# Patient Record
Sex: Male | Born: 1955 | Race: White | Hispanic: No | Marital: Married | Smoking: Never smoker
Health system: Southern US, Community
[De-identification: ages and names within clinical notes are randomized; demographics above are authoritative.]

## PROBLEM LIST (undated history)

## (undated) DIAGNOSIS — I10 Essential (primary) hypertension: Secondary | ICD-10-CM

## (undated) DIAGNOSIS — M4310 Spondylolisthesis, site unspecified: Secondary | ICD-10-CM

## (undated) DIAGNOSIS — I2510 Atherosclerotic heart disease of native coronary artery without angina pectoris: Secondary | ICD-10-CM

## (undated) DIAGNOSIS — N400 Enlarged prostate without lower urinary tract symptoms: Secondary | ICD-10-CM

## (undated) DIAGNOSIS — R7303 Prediabetes: Secondary | ICD-10-CM

## (undated) DIAGNOSIS — E785 Hyperlipidemia, unspecified: Secondary | ICD-10-CM

## (undated) DIAGNOSIS — M549 Dorsalgia, unspecified: Secondary | ICD-10-CM

## (undated) DIAGNOSIS — R079 Chest pain, unspecified: Secondary | ICD-10-CM

## (undated) DIAGNOSIS — G4733 Obstructive sleep apnea (adult) (pediatric): Secondary | ICD-10-CM

## (undated) HISTORY — DX: Benign prostatic hyperplasia without lower urinary tract symptoms: N40.0

## (undated) HISTORY — DX: Chest pain, unspecified: R07.9

## (undated) HISTORY — DX: Atherosclerotic heart disease of native coronary artery without angina pectoris: I25.10

## (undated) HISTORY — DX: Spondylolisthesis, site unspecified: M43.10

## (undated) HISTORY — DX: Obstructive sleep apnea (adult) (pediatric): G47.33

## (undated) HISTORY — DX: Dorsalgia, unspecified: M54.9

## (undated) HISTORY — DX: Prediabetes: R73.03

## (undated) HISTORY — DX: Essential (primary) hypertension: I10

## (undated) HISTORY — DX: Hyperlipidemia, unspecified: E78.5

---
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
## 1980-11-05 HISTORY — PX: MOUTH SURGERY: SHX715

## 2011-11-06 DIAGNOSIS — G4733 Obstructive sleep apnea (adult) (pediatric): Secondary | ICD-10-CM

## 2011-11-06 HISTORY — DX: Obstructive sleep apnea (adult) (pediatric): G47.33

## 2014-11-27 ENCOUNTER — Encounter: Payer: Self-pay | Admitting: Cardiology

## 2014-12-23 ENCOUNTER — Ambulatory Visit (INDEPENDENT_AMBULATORY_CARE_PROVIDER_SITE_OTHER): Admitting: Cardiology

## 2014-12-23 ENCOUNTER — Encounter: Payer: Self-pay | Admitting: Cardiology

## 2014-12-23 VITALS — BP 136/84 | HR 56 | Ht 71.0 in | Wt 237.1 lb

## 2014-12-23 DIAGNOSIS — M5431 Sciatica, right side: Secondary | ICD-10-CM | POA: Insufficient documentation

## 2014-12-23 DIAGNOSIS — R0789 Other chest pain: Secondary | ICD-10-CM

## 2014-12-23 DIAGNOSIS — I10 Essential (primary) hypertension: Secondary | ICD-10-CM | POA: Diagnosis not present

## 2014-12-23 DIAGNOSIS — R0609 Other forms of dyspnea: Secondary | ICD-10-CM | POA: Diagnosis not present

## 2014-12-23 DIAGNOSIS — E7800 Pure hypercholesterolemia, unspecified: Secondary | ICD-10-CM | POA: Insufficient documentation

## 2014-12-23 DIAGNOSIS — R079 Chest pain, unspecified: Secondary | ICD-10-CM | POA: Insufficient documentation

## 2014-12-23 NOTE — Progress Notes (Signed)
Todd Kane Date of Birth:  Mar 03, 1956 [facility] 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  He is seen at the request of Dr. Kateri Plummer.  He is being seen because of a recent episode of chest discomfort which occurred during exertion.  Patient was shoveling snow during the last storm.  After shoveling for about 45 minutes he began to notice a discomfort in the right central portion of his chest.  It did not radiate.  The pain persisted and he called EMS who came out and checked on him and didn't EKG.  By that time the pain was resolving after he had taken some aspirin at home and the EKG done by EMS showed normal sinus rhythm and no ischemic changes.  They offered to carry him to the emergency room but he declined.  He comes in today for further evaluation.  The patient is new to this area.  He had the lab work for his complete physical with Dr. Kateri Plummer yesterday and the results of that are pending.  He has a history of hypercholesterolemia and is on atorvastatin.  He has had high blood pressure for approximately 10 years and is on Hyzaar.  He takes a baby aspirin daily.  He states that several years ago he had a stress test while living in [place] which was normal.  The patient also has a history of obstructive sleep apnea and BPH.  He also has spondylolisthesis and sciatica with radiation of pain down the right leg.  The patient has a history of prediabetes and is modestly overweight. His family history reveals a strong family history of heart disease on his mother's side.  His mother had angina pectoris but died of breast cancer at age 92.  His maternal grandmother had a heart attack.  2 maternal uncles have had heart attacks at relatively young age.  Patient's father died of COPD and was a heavy smoker. The patient is a  retired Education officer, community.  He just retired from serving his career in the [employer].  He graduated from [employer].  Current Outpatient Prescriptions  Medication Sig Dispense Refill  . alfuzosin (UROXATRAL) 10 MG 24 hr tablet Take 10 mg by mouth daily with breakfast.    . aspirin 81 MG tablet Take 81 mg by mouth daily.    Marland Kitchen atorvastatin (LIPITOR) 40 MG tablet Take 40 mg by mouth daily.    . cholecalciferol (VITAMIN D) 1000 UNITS tablet Take 1,000 Units by mouth daily.    . finasteride (PROSCAR) 5 MG tablet Take 5 mg by mouth daily.  0  . losartan-hydrochlorothiazide (HYZAAR) 100-25 MG per tablet Take 1 tablet by mouth daily.     No current facility-administered medications for this visit.    Allergies  Allergen Reactions  . Oxytetracycline     Not known  . Sulfa Antibiotics     Not known    Patient Active Problem List   Diagnosis Date Noted  . Chest pain of uncertain etiology 12/23/2014  . Dyspnea on exertion 12/23/2014  . Sciatica of right side 12/23/2014  . Essential hypertension 12/23/2014  . Hypercholesterolemia 12/23/2014    History  Smoking status  . Not on file  Smokeless tobacco  . Not on file    History  Alcohol  Use: Not on file    Family History  Problem Relation Age of Onset  . Heart disease Mother   . COPD Mother   . Cancer Mother   . COPD Father     Review of Systems: Constitutional: no fever chills diaphoresis or fatigue or change in weight.  Head and neck: no hearing loss, no epistaxis, no photophobia or visual disturbance. Respiratory: Positive for exertional dyspnea Cardiovascular: No chest pain peripheral edema, palpitations. Gastrointestinal: No abdominal distention, no abdominal pain, no change in bowel habits hematochezia or melena. Genitourinary: No dysuria, no frequency, no urgency, no nocturia. Musculoskeletal:No arthralgias, no back pain, no gait disturbance or myalgias. Neurological: No dizziness, no headaches, no numbness, no  seizures, no syncope, no weakness, no tremors. Hematologic: No lymphadenopathy, no easy bruising. Psychiatric: No confusion, no hallucinations, no sleep disturbance.   Wt Readings from Last 3 Encounters:  12/23/14 237 lb 1.9 oz (107.557 kg)    Physical Exam: Filed Vitals:   12/23/14 1544  BP: 136/84  Pulse: 56  The patient appears to be in no distress.  Mildly obese  Head and neck exam reveals that the pupils are equal and reactive.  The extraocular movements are full.  There is no scleral icterus.  Mouth and pharynx are benign.  No lymphadenopathy.  No carotid bruits.  The jugular venous pressure is normal.  Thyroid is not enlarged or tender.  Chest is clear to percussion and auscultation.  No rales or rhonchi.  Expansion of the chest is symmetrical.  Heart reveals no abnormal lift or heave.  First and second heart sounds are normal.  There is no murmur gallop rub or click.  The abdomen is soft and nontender.  Bowel sounds are normoactive.  There is no hepatosplenomegaly or mass.  There are no abdominal bruits.  Extremities reveal no phlebitis or edema.  Pedal pulses are good.  There is no cyanosis or clubbing.  Neurologic exam is normal strength and no lateralizing weakness.  No sensory deficits.  Integument reveals no rash  EKG shows sinus bradycardia and is otherwise within normal limits.  Assessment / Plan: 1.  Exertional chest discomfort with multiple risk factors for coronary disease. 2.  Essential hypertension 3.  Dyspnea on exertion 4.  Obstructive sleep apnea 5.  Hypercholesterolemia 6.  Borderline diabetes by history 7.  Sciatica with right leg radiation  Plan: The patient will continue current medication including daily aspirin.  We will have him return soon for a walking Lexiscan Myoview stress test to evaluate his chest pain and dyspnea further. Many thanks for the opportunity to see this pleasant gentleman.  I will be in touch regarding the results of his  stress test.

## 2014-12-23 NOTE — Patient Instructions (Addendum)
Your physician recommends that you continue on your current medications as directed. Please refer to the Current Medication list given to you today.  Your physician has requested that you have a lexiscan myoview. For further information please visit [email or web address]. Please follow instruction sheet, as given. Richelle ItoWALKING LEXISCAN  Your physician wants you to follow-up in: 6 month ov You will receive a reminder letter in the mail two months in advance. If you don't receive a letter, please call our office to schedule the follow-up appointment.

## 2015-01-11 ENCOUNTER — Ambulatory Visit (HOSPITAL_COMMUNITY): Attending: Cardiology | Admitting: Radiology

## 2015-01-11 DIAGNOSIS — R002 Palpitations: Secondary | ICD-10-CM | POA: Insufficient documentation

## 2015-01-11 DIAGNOSIS — R079 Chest pain, unspecified: Secondary | ICD-10-CM | POA: Insufficient documentation

## 2015-01-11 DIAGNOSIS — I10 Essential (primary) hypertension: Secondary | ICD-10-CM | POA: Diagnosis not present

## 2015-01-11 DIAGNOSIS — R0789 Other chest pain: Secondary | ICD-10-CM

## 2015-01-11 DIAGNOSIS — R0609 Other forms of dyspnea: Secondary | ICD-10-CM | POA: Insufficient documentation

## 2015-01-11 MED ORDER — TECHNETIUM TC 99M SESTAMIBI GENERIC - CARDIOLITE
11.0000 | Freq: Once | INTRAVENOUS | Status: AC | PRN
  Administered 2015-01-11: 11 via INTRAVENOUS

## 2015-01-11 MED ORDER — TECHNETIUM TC 99M SESTAMIBI GENERIC - CARDIOLITE
33.0000 | Freq: Once | INTRAVENOUS | Status: AC | PRN
  Administered 2015-01-11: 33 via INTRAVENOUS

## 2015-01-11 NOTE — Progress Notes (Signed)
[facility] SITE 3 NUCLEAR MED [address] [place], [place] [address] [phone]    Cardiology Nuclear Med Study  Todd Kane is a 59 y.o. male     MRN : [id number]     DOB: 11/08/1955  Procedure Date: 01/11/2015  Nuclear Med Background Indication for Stress Test:  Evaluation for Ischemia History:  No known CAD Cardiac Risk Factors: Hypertension and Lipids  Symptoms:  Chest Pain, DOE and Palpitations   Nuclear Pre-Procedure Caffeine/Decaff Intake:  None> 12 hrs NPO After: 11:00pm   Lungs:  clear O2 Sat: 97% on room air. IV 0.9% NS with Angio Cath:  22g  IV Site: R Hand x 1, tolerated well IV Started by:  Irean HongPatsy Edwards, RN  Chest Size (in):  46 Cup Size: n/a  Height: 5\' 11"  (1.803 m)  Weight:  230 lb (104.327 kg)  BMI:  Body mass index is 32.09 kg/(m^2). Tech Comments:  Patient took Hyzaar last night. Irean HongPatsy Edwards, RN.    Nuclear Med Study 1 or 2 day study: 1 day  Stress Test Type:  Stress  Reading MD: N/A  Order Authorizing Provider:  Cassell Clementhomas Brackbill, MD  Resting Radionuclide: Technetium 7333m Sestamibi  Resting Radionuclide Dose: 11.0 mCi   Stress Radionuclide:  Technetium 3233m Sestamibi  Stress Radionuclide Dose: 33.0 mCi           Stress Protocol Rest HR: 50 Stress HR: 142  Rest BP: 124/81 Stress BP: 157/76  Exercise Time (min): 8:00 METS: 10.1   Predicted Max HR: 159 bpm % Max HR: 89.31 bpm Rate Pressure Product: [id number]   Dose of Adenosine (mg):  n/a Dose of Lexiscan: n/a mg  Dose of Atropine (mg): n/a Dose of Dobutamine: n/a mcg/kg/min (at max HR)  Stress Test Technologist: Nelson ChimesSharon Brooks, BS-ES  Nuclear Technologist:  Kerby NoraElzbieta Kubak, CNMT     Rest Procedure:  Myocardial perfusion imaging was performed at rest 45 minutes following the intravenous administration of Technetium 6733m Sestamibi. Rest ECG: sinus bradycardia at 50bpm with nonspecific T wave abnormality  Stress Procedure:  The patient exercised on the treadmill utilizing the Bruce  Protocol for 8:00 minutes. The patient stopped due to fatigue and denied any chest pain.  Technetium 5333m Sestamibi was injected at peak exercise and myocardial perfusion imaging was performed after a brief delay. Stress ECG: No significant change from baseline ECG  QPS Raw Data Images:  Mild diaphragmatic attenuation.  Normal left ventricular size. Stress Images:  There is decreased uptake in the basal and mid inferolateral wall. Rest Images:  There is decreased uptake in the  inferolateral wall. Subtraction (SDS):  No evidence of ischemia. Transient Ischemic Dilatation (Normal <1.22):  0.94 Lung/Heart Ratio (Normal <0.45):  0.38  Quantitative Gated Spect Images QGS EDV:  101 ml QGS ESV:  42 ml  Impression Exercise Capacity:  Good exercise capacity. BP Response:  Normal blood pressure response. Clinical Symptoms:  No significant symptoms noted. ECG Impression:  No significant ST segment change suggestive of ischemia. Comparison with Prior Nuclear Study: No images to compare  Overall Impression:  Low risk stress nuclear study with a small in size, mild in intesity, fixed defect in the basal and mid inferolateral walls.  There is no evidence of inducible ischemia.  This is most likely secondary to diaphragmatic attenuation..  LV Ejection Fraction: 58%.  LV Wall Motion:  NL LV Function; NL Wall Motion  Signed: Armanda Magicraci Turner, MD [facility] 01/11/2015

## 2015-08-24 ENCOUNTER — Ambulatory Visit: Attending: Family Medicine

## 2015-08-24 DIAGNOSIS — M545 Low back pain, unspecified: Secondary | ICD-10-CM

## 2015-08-24 DIAGNOSIS — M25659 Stiffness of unspecified hip, not elsewhere classified: Secondary | ICD-10-CM | POA: Insufficient documentation

## 2015-08-24 NOTE — Therapy (Signed)
[facility] [address], [address] [place], [place], [address] Phone: [phone]   Fax:  [phone]  Physical Therapy Evaluation  Patient Details  Name: Todd Kane MRN: [id number] Date of Birth: [birth year] Referring Provider: Farris Has, MD  Encounter Date: 08/24/2015      PT End of Session - 08/24/15 1514    Visit Number 1   Date for PT Re-Evaluation 10/19/15   PT Start Time 1436   PT Stop Time 1521   PT Time Calculation (min) 45 min   Activity Tolerance Patient tolerated treatment well   Behavior During Therapy [facility] for tasks assessed/performed      Past Medical History  Diagnosis Date  . Hyperlipidemia   . Hypertension   . Chest pain   . OSA (obstructive sleep apnea)   . BPH (benign prostatic hyperplasia)   . Back pain   . Spondylolisthesis     Past Surgical History  Procedure Laterality Date  . Tonsillectomy and adenoidectomy    . Mouth surgery  1982    3rd molers     There were no vitals filed for this visit.  Visit Diagnosis:  Bilateral low back pain without sciatica - Plan: PT plan of care cert/re-cert  Hip stiffness, unspecified laterality - Plan: PT plan of care cert/re-cert      Subjective Assessment - 08/24/15 1435    Subjective Pt reports to PT with complaints of >10 year history of LBP. Pt has radiculopathy into the Rt LE.  Pt had MRI ~5 years ago and showed spondylolisthsis and stenosis per pt report.     Limitations Standing;Walking   How long can you walk comfortably? Rt leg feels weak after walking for exercise   Diagnostic tests MRI: spondylisthesis and stenosis   Patient Stated Goals reduce pain, stand longer   Currently in Pain? Yes   Pain Score 6   4-6/10   Pain Location Back   Pain Orientation Right;Left;Lower   Pain Type Chronic pain   Pain Radiating Towards Rt gluteals, down leg into the toes   Pain Onset More than a month ago   Pain Frequency Constant    Aggravating Factors  walking, standing   Pain Relieving Factors stretching, sitting            [facility] PT Assessment - 08/24/15 0001    Assessment   Medical Diagnosis back pIn (M54.9)   Referring Provider Farris Has, MD   Onset Date/Surgical Date 08/23/05   Next MD Visit 11/2015   Prior Therapy 1.5 years ago in [place]   Precautions   Precautions None   Restrictions   Weight Bearing Restrictions No   Balance Screen   Has the patient fallen in the past 6 months No   Has the patient had a decrease in activity level because of a fear of falling?  No   Is the patient reluctant to leave their home because of a fear of falling?  No   Home Environment   Living Environment Private residence   Type of Home House   Home Access Stairs to enter   Entrance Stairs-Number of Steps 3   Home Layout Two level   Prior Function   Level of Independence Independent   Vocation Retired  [employer]   Leisure walk for exercise   Cognition   Overall Cognitive Status Within Functional Limits for tasks assessed   Observation/Other Assessments   Focus on Therapeutic Outcomes (FOTO)  43% limitation   Posture/Postural Control  Posture/Postural Control Postural limitations   Postural Limitations Rounded Shoulders;Decreased lumbar lordosis   ROM / Strength   AROM / PROM / Strength AROM;PROM;Strength   AROM   Overall AROM  Within functional limits for tasks performed   Overall AROM Comments Full lumbar AROM with pain reported with lumbar extension   PROM   Overall PROM  Deficits   Overall PROM Comments Hamstring flexibility limited by 50% bilaterally, IR limited by 25%   Strength   Overall Strength Within functional limits for tasks performed   Overall Strength Comments 4+/5 bilateral LE strength   Palpation   Spinal mobility reduce mobility T10-L5 with pain at L3-5   Palpation comment tension and mild tenderness over bilateral lumbar parapsinals and deep gluteals                            PT Education - 08/24/15 1513    Education provided Yes   Education Details HEP: lumbar flexibility   Person(s) Educated Patient   Methods Explanation;Demonstration;Handout   Comprehension Verbalized understanding;Returned demonstration          PT Short Term Goals - 08/24/15 1525    PT SHORT TERM GOAL #1   Title be independent in initial HEP   Time 4   Period Weeks   Status New   PT SHORT TERM GOAL #2   Title report a 25% reduction in Rt LE pain with standing and walking   Time 4   Period Weeks   Status New   PT SHORT TERM GOAL #3   Title demonstrate correct body mechanics with getting out of bed and with lifitng for lumbar protection   Time 4   Period Weeks   Status New           PT Long Term Goals - 08/24/15 1434    PT LONG TERM GOAL #1   Title be independent in advanced HEP   Time 8   Period Weeks   Status New   PT LONG TERM GOAL #2   Title reduce FOTO to < or = to 36% limitation   Time 8   Period Weeks   Status New   PT LONG TERM GOAL #3   Title report a 50% reduction in Rt LE pain with standing and walking   Time 8   Period Weeks   Status New   PT LONG TERM GOAL #4   Title walk for exercise without Rt LE feeling weak or giving out    Time 8   Period Weeks   Status New               Plan - 08/24/15 1521    Clinical Impression Statement Pt presents to PT with complatins of LBP and Rt LE lasting > 10 years.  Symptoms have increased in the Rt LE over the past year or so.  Pt demonstrates significant hamstring tighteness and pain with lumbar extension.  Pt with weak abdominals and FOTO score of 43% limitation.  Pt will benefit from skilled PT for pain management, core strength progression, hip flexibility and traction if needed.     Pt will benefit from skilled therapeutic intervention in order to improve on the following deficits Postural dysfunction;Impaired flexibility;Improper body  mechanics;Pain;Decreased activity tolerance;Decreased range of motion   Rehab Potential Good   PT Frequency 2x / week   PT Duration 8 weeks   PT Treatment/Interventions ADLs/Self Care Home Management;Cryotherapy;Electrical Stimulation;Moist Heat;Therapeutic exercise;Therapeutic  activities;Functional mobility training;Ultrasound;Traction;Neuromuscular re-education;Patient/family education;Manual techniques;Passive range of motion   PT Next Visit Plan Begin core strength, hip flexor stretch, lumbar traction   Consulted and Agree with Plan of Care Patient          G-Codes - 08/24/15 1433    Functional Assessment Tool Used --   Functional Limitation --   Other PT Primary Current Status (Z6109(G8990) --   Other PT Primary Goal Status (U0454(G8991) --       Problem List Patient Active Problem List   Diagnosis Date Noted  . Chest pain of uncertain etiology 12/23/2014  . Dyspnea on exertion 12/23/2014  . Sciatica of right side 12/23/2014  . Essential hypertension 12/23/2014  . Hypercholesterolemia 12/23/2014    Estil Vallee, PT 08/24/2015, 3:28 PM  [facility] [address], [address] [place], [place], [address] Phone: [phone]   Fax:  [phone]  Name: Todd Kane MRN: [id number] Date of Birth: 09/19/1956

## 2015-08-24 NOTE — Patient Instructions (Signed)
Perform all exercises below:  Hold _20___ seconds. Repeat _3___ times.  Do __3__ sessions per day. CAUTION: Movement should be gentle, steady and slow.  Knee to Chest  Lying supine, bend involved knee to chest. Perform with each leg.  Copyright  VHI. All rights reserved.  Double Knee to Chest (Flexion)   Gently pull both knees toward chest. Feel stretch in lower back or buttock area. Breathing deeply, Lumbar Rotation: Caudal - Bilateral (Supine)  Feet and knees together, arms outstretched, rotate knees left, turning head in opposite direction, until stretch is felt.      HIP: Hamstrings - Short Sitting   Rest leg on raised surface. Keep knee straight. Lift chest.   [facility] [address], [address] [place], [place] [address] Phone # [phone] Fax [phone] 

## 2015-09-05 ENCOUNTER — Encounter: Payer: Self-pay | Admitting: Physical Therapy

## 2015-09-05 ENCOUNTER — Ambulatory Visit: Admitting: Physical Therapy

## 2015-09-05 DIAGNOSIS — M25659 Stiffness of unspecified hip, not elsewhere classified: Secondary | ICD-10-CM

## 2015-09-05 DIAGNOSIS — M545 Low back pain, unspecified: Secondary | ICD-10-CM

## 2015-09-05 NOTE — Therapy (Signed)
[facility] [address], [address] [place], [place], [address] Phone: [phone]   Fax:  [phone]  Physical Therapy Treatment  Patient Details  Name: Todd Kane MRN: [id number] Date of Birth: 03/30/1956 Referring Provider: Farris HasMorrow, Aaron, MD  Encounter Date: 09/05/2015      PT End of Session - 09/05/15 1640    Visit Number 2   Date for PT Re-Evaluation 10/19/15   PT Start Time 1615   PT Stop Time 1700   PT Time Calculation (min) 45 min   Activity Tolerance Patient tolerated treatment well   Behavior During Therapy [facility] for tasks assessed/performed      Past Medical History  Diagnosis Date  . Hyperlipidemia   . Hypertension   . Chest pain   . OSA (obstructive sleep apnea)   . BPH (benign prostatic hyperplasia)   . Back pain   . Spondylolisthesis     Past Surgical History  Procedure Laterality Date  . Tonsillectomy and adenoidectomy    . Mouth surgery  1982    3rd molers     There were no vitals filed for this visit.  Visit Diagnosis:  Bilateral low back pain without sciatica  Hip stiffness, unspecified laterality      Subjective Assessment - 09/05/15 1622    Subjective Pt reports compliance with initial HEP and it seem to help. No complains of pain at this time, but early this morning rated as a 4/10 .   Pertinent History LBP > 10 year, with radiculopath into Rt LE. MRI 5 years ago showed spondylosisthesis and stenosis per pt report   Limitations Standing;Walking   Currently in Pain? No/denies                         [facility] PT Treatment/Exercise - 09/05/15 0001    Posture/Postural Control   Posture/Postural Control Postural limitations   Postural Limitations Rounded Shoulders;Decreased lumbar lordosis   Exercises   Exercises Lumbar;Knee/Hip   Lumbar Exercises: Stretches   Active Hamstring Stretch 3 reps;20 seconds  each leg   Single Knee to Chest Stretch 3 reps;20  seconds  each leg with oposite leg extented   Double Knee to Chest Stretch 3 reps;20 seconds  using towel   Lower Trunk Rotation 3 reps;20 seconds  each side    Prone Mid Back Stretch --  Cobra/ forearm postion for hip stretch   Lumbar Exercises: Aerobic   Stationary Bike L 1 x 10min   Lumbar Exercises: Supine   Bridge 10 reps;5 seconds   Lumbar Exercises: Quadruped   Plank 3 x 10 sec hold                PT Education - 09/05/15 1710    Education provided Yes   Education Details iliopsoas stretch in thomasgrip, and at edge of table, low lunge,  Bridge   Person(s) Educated Patient   Methods Explanation;Demonstration;Handout   Comprehension Verbalized understanding;Returned demonstration          PT Short Term Goals - 09/05/15 1645    PT SHORT TERM GOAL #1   Title be independent in initial HEP   Time 4   Period Weeks   Status On-going   PT SHORT TERM GOAL #2   Title report a 25% reduction in Rt LE pain with standing and walking   Time 4   Period Weeks   Status On-going   PT SHORT TERM GOAL #3   Title  demonstrate correct body mechanics with getting out of bed and with lifitng for lumbar protection   Time 4   Period Weeks   Status On-going           PT Long Term Goals - 09/05/15 1645    PT LONG TERM GOAL #1   Title be independent in advanced HEP   Time 8   Period Weeks   Status On-going   PT LONG TERM GOAL #2   Title reduce FOTO to < or = to 36% limitation   Time 8   Period Weeks   Status On-going   PT LONG TERM GOAL #3   Title report a 50% reduction in Rt LE pain with standing and walking   Time 8   Period Weeks   Status On-going   PT LONG TERM GOAL #4   Title walk for exercise without Rt LE feeling weak or giving out    Time 8   Period Weeks   Status On-going               Plan - 09/05/15 1640    Clinical Impression Statement Pt with good demo of initial HEP. Pt with weak abdominals and tight hamstreings and hip flexors.Pt will  continue to benefit from skilled PT for pain managment, core strength progression, hip flexibility   Pt will benefit from skilled therapeutic intervention in order to improve on the following deficits Postural dysfunction;Impaired flexibility;Improper body mechanics;Pain;Decreased activity tolerance;Decreased range of motion   Rehab Potential Good   PT Frequency 2x / week   PT Duration 8 weeks   PT Treatment/Interventions ADLs/Self Care Home Management;Cryotherapy;Electrical Stimulation;Moist Heat;Therapeutic exercise;Therapeutic activities;Functional mobility training;Ultrasound;Traction;Neuromuscular re-education;Patient/family education;Manual techniques;Passive range of motion   PT Next Visit Plan Add Hamstring in supine, review iliopsoas stretch, low lunge and bridging, work on core strenght in plank .   Consulted and Agree with Plan of Care Patient        Problem List Patient Active Problem List   Diagnosis Date Noted  . Chest pain of uncertain etiology 12/23/2014  . Dyspnea on exertion 12/23/2014  . Sciatica of right side 12/23/2014  . Essential hypertension 12/23/2014  . Hypercholesterolemia 12/23/2014    NAUMANN-HOUEGNIFIO,Todd Kane  PTA  09/05/2015, 5:24 PM  [facility] [address] [place], [place], [address] Phone: [phone]   Fax:  [phone]  Name: Todd Kane MRN: [id number] Date of Birth: 10-Sep-1956

## 2015-09-05 NOTE — Patient Instructions (Addendum)
Flexors, Supine Bridge    Strength       Remember to discontinue exercise if you experience pain in leg or back    Lie supine, feet shoulder-width apart. Lift hips toward ceiling. Hold __5_ seconds. Repeat _10_ times per session. Do _2-3 __ sessions per day.  Hip Flexor Stretch  flexibility   Lying on back near edge of bed, bend one leg, foot flat. Hang other leg over edge, relaxed, thigh resting entirely on bed for  20 seconds. Repeat _x 3 times. Do 2-3 sessions per day. Advanced Exercise: Bend knee back keeping thigh in contact with bed.  http://[email or web address].exer.us/346   HIP: Flexors - Supine   flexibility   Lie on edge of surface. Place leg off the surface, allow knee to bend. Bring other knee toward chest. Hold 20 seconds. _3__ reps per set, _2-3__ sets per day,  .  BACK: Hip Flexor Stretch   Interlace fingers on top of right knee. Shift weight forward. Continue breathing normally and hold position for  20  breaths. Repeat on other leg. Alternate sides  3  times. Do  2-3 times per day.  Quads / HF, Standing   Stand, holding onto chair and grasping one foot with other-side hand. Pull heel toward buttock until stretch is felt in front of thigh. Hold _20__ seconds.  Repeat _3__ times per session. Do 2-3___ sessions per day.  Copyright  VHI. All rights reserved.

## 2015-09-21 ENCOUNTER — Ambulatory Visit: Attending: Family Medicine

## 2015-09-21 DIAGNOSIS — M25659 Stiffness of unspecified hip, not elsewhere classified: Secondary | ICD-10-CM | POA: Insufficient documentation

## 2015-09-21 DIAGNOSIS — M545 Low back pain, unspecified: Secondary | ICD-10-CM

## 2015-09-21 NOTE — Patient Instructions (Signed)
   Lifting Principles  .Maintain proper posture and head alignment. .Slide object as close as possible before lifting. .Move obstacles out of the way. .Test before lifting; ask for help if too heavy. .Tighten stomach muscles without holding breath. .Use smooth movements; do not jerk. .Use legs to do the work, and pivot with feet. .Distribute the work load symmetrically and close to the center of trunk. .Push instead of pull whenever possible.   Squat down and hold basket close to stand. Use leg muscles to do the work.    Avoid twisting or bending back. Pivot around using foot movements, and bend at knees if needed when reaching for articles.        Getting Into / Out of Bed   Lower self to lie down on one side by raising legs and lowering head at the same time. Use arms to assist moving without twisting. Bend both knees to roll onto back if desired. To sit up, start from lying on side, and use same move-ments in reverse. Keep trunk aligned with legs.    Shift weight from front foot to back foot as item is lifted off shelf.    When leaning forward to pick object up from floor, extend one leg out behind. Keep back straight. Hold onto a sturdy support with other hand.      Sit upright, head facing forward. Try using a roll to support lower back. Keep shoulders relaxed, and avoid rounded back. Keep hips level with knees. Avoid crossing legs for long periods.    Supine: Leg Stretch With Strap (Basic)    Lie on back with one knee bent, foot flat on floor. Hook strap around other foot. Straighten knee. Keep knee level with other knee. Hold _20__ seconds. Relax leg completely down to floor.  Repeat _3__ times per session. Do _3__ sessions per day.  Copyright  VHI. All rights reserved.  [facility] [address], [address] [place], [place] [address] Phone # [phone] Fax [phone]

## 2015-09-21 NOTE — Therapy (Signed)
[facility] [address], [address] [place], [place], [address] Phone: [phone]   Fax:  [phone]  Physical Therapy Treatment  Patient Details  Name: Tonye Royaltyhomas Strohman MRN: [id number] Date of Birth: 02/26/1956 Referring Provider: Farris HasMorrow, Aaron, MD  Encounter Date: 09/21/2015      PT End of Session - 09/21/15 1656    Visit Number 3   Date for PT Re-Evaluation 10/19/15   PT Start Time 1614   PT Stop Time 1656   PT Time Calculation (min) 42 min   Activity Tolerance Patient tolerated treatment well   Behavior [facility] for tasks assessed/performed      Past Medical History  Diagnosis Date  . Hyperlipidemia   . Hypertension   . Chest pain   . OSA (obstructive sleep apnea)   . BPH (benign prostatic hyperplasia)   . Back pain   . Spondylolisthesis     Past Surgical History  Procedure Laterality Date  . Tonsillectomy and adenoidectomy    . Mouth surgery  1982    3rd molers     There were no vitals filed for this visit.  Visit Diagnosis:  Bilateral low back pain without sciatica  Hip stiffness, unspecified laterality      Subjective Assessment - 09/21/15 1619    Subjective Pt reports intermittent compliance with HEP.  would like to transition [facility] gym program [facility]   Currently in Pain? Yes   Pain Score 2    Pain Location Back   Pain Orientation Right;Left;Lower   Pain Type Chronic pain   Pain Radiating Towards Rt gluteals   Pain Onset More than a month ago   Pain Frequency Constant   Aggravating Factors  walking, standing   Pain Relieving Factors stretching, sitting                         [facility] Adult PT Treatment/Exercise - 09/21/15 0001    Lumbar Exercises: Stretches   Active Hamstring Stretch 3 reps;20 seconds  each leg   Single Knee to Chest Stretch 3 reps;20 seconds  each leg with oposite leg extented   Double Knee to Chest Stretch 3 reps;20 seconds   using towel   Lower Trunk Rotation 3 reps;20 seconds  each side    Lumbar Exercises: Aerobic   Stationary Bike L 2 x 10min   Lumbar Exercises: Supine   Bridge 10 reps;5 seconds                PT Education - 09/21/15 1640    Education provided Yes   Education Details Estate manager/land agentbody mechanics education, hamstring flexibility   Person(s) Educated Patient   Methods Explanation;Demonstration;Handout   Comprehension Verbalized understanding;Returned demonstration          PT Short Term Goals - 09/21/15 1621    PT SHORT TERM GOAL #1   Title be independent in initial HEP   Time 4   Period Weeks   Status On-going   PT SHORT TERM GOAL #2   Title report a 25% reduction in Rt LE pain with standing and walking   Time 4   Period Weeks   Status On-going   PT SHORT TERM GOAL #3   Title demonstrate correct body mechanics with getting out of bed and with lifitng for lumbar protection   Status Achieved           PT Long Term Goals - 09/05/15 1645    PT  LONG TERM GOAL #1   Title be independent in advanced HEP   Time 8   Period Weeks   Status On-going   PT LONG TERM GOAL #2   Title reduce FOTO to < or = to 36% limitation   Time 8   Period Weeks   Status On-going   PT LONG TERM GOAL #3   Title report a 50% reduction in Rt LE pain with standing and walking   Time 8   Period Weeks   Status On-going   PT LONG TERM GOAL #4   Title walk for exercise without Rt LE feeling weak or giving out    Time 8   Period Weeks   Status On-going               Plan - 09/21/15 1633    Clinical Impression Statement Pt with only 2 sessions after evaluation and reports limited compliance with HEP.  Pt would like to transition to program at [facility] after PT.  Pt received body mechanics education today and is able to verbalize and demo correct technique.  Pt with continued Rt LE pain and hip stiffness and core weakness.  Pt will benefit from skilled PT for progression of HEP and safe gym  exercise progression.     Pt will benefit from skilled therapeutic intervention in order to improve on the following deficits Postural dysfunction;Impaired flexibility;Improper body mechanics;Pain;Decreased activity tolerance;Decreased range of motion   Rehab Potential Good   PT Frequency 2x / week   PT Duration 8 weeks   PT Treatment/Interventions ADLs/Self Care Home Management;Cryotherapy;Electrical Stimulation;Moist Heat;Therapeutic exercise;Therapeutic activities;Functional mobility training;Ultrasound;Traction;Neuromuscular re-education;Patient/family education;Manual techniques;Passive range of motion   PT Next Visit Plan Add Hamstring in supine, review iliopsoas stretch, low lunge and bridging   Consulted and Agree with Plan of Care Patient        Problem List Patient Active Problem List   Diagnosis Date Noted  . Chest pain of uncertain etiology 12/23/2014  . Dyspnea on exertion 12/23/2014  . Sciatica of right side 12/23/2014  . Essential hypertension 12/23/2014  . Hypercholesterolemia 12/23/2014    TAKACS,KELLY, PT 09/21/2015, 4:58 PM  [facility] [address] [place], [place], [address] Phone: [phone]   Fax:  [phone]  Name: Yandriel Boening MRN: [id number] Date of Birth: December 21, 1955

## 2015-09-26 ENCOUNTER — Ambulatory Visit: Admitting: Physical Therapy

## 2015-10-05 ENCOUNTER — Ambulatory Visit

## 2015-10-05 DIAGNOSIS — M545 Low back pain, unspecified: Secondary | ICD-10-CM

## 2015-10-05 DIAGNOSIS — M25659 Stiffness of unspecified hip, not elsewhere classified: Secondary | ICD-10-CM

## 2015-10-05 NOTE — Therapy (Signed)
[facility] [address] [place], [place], [address] Phone: [phone]   Fax:  [phone]  Physical Therapy Treatment  Patient Details  Name: Todd Kane MRN: [id number] Date of Birth: 12/13/1955 Referring Provider: London Pepper, MD  Encounter Date: 10/05/2015      PT End of Session - 10/05/15 1523    Visit Number 4   PT Start Time 5852   PT Stop Time 1523   PT Time Calculation (min) 40 min   Activity Tolerance Patient tolerated treatment well   Behavior During Therapy [facility] for tasks assessed/performed      Past Medical History  Diagnosis Date  . Hyperlipidemia   . Hypertension   . Chest pain   . OSA (obstructive sleep apnea)   . BPH (benign prostatic hyperplasia)   . Back pain   . Spondylolisthesis     Past Surgical History  Procedure Laterality Date  . Tonsillectomy and adenoidectomy    . Mouth surgery  1982    3rd molers     There were no vitals filed for this visit.  Visit Diagnosis:  Bilateral low back pain without sciatica  Hip stiffness, unspecified laterality      Subjective Assessment - 10/05/15 1449    Subjective Pt reports moderate compliance with HEP.  Pt would like to transition to gym program at [facility].   Currently in Pain? Yes   Pain Score 2    Pain Location Back   Pain Orientation Right;Left;Lower   Pain Descriptors / Indicators Aching;Sore   Pain Type Chronic pain   Pain Onset More than a month ago   Pain Frequency Constant   Aggravating Factors  walking, standing   Pain Relieving Factors stretching, sitting            [facility] PT Assessment - 10/05/15 0001    Assessment   Medical Diagnosis Back pain (M54.9)   Onset Date/Surgical Date 08/23/05   Next MD Visit 11/2015   Prior Function   Level of Independence Independent   Vocation Retired  [employer]   Leisure walk for exercise   Cognition   Overall Cognitive Status Within Functional Limits for tasks  assessed   Observation/Other Assessments   Focus on Therapeutic Outcomes (FOTO)  37% limitation                     [facility] Adult PT Treatment/Exercise - 10/05/15 0001    Lumbar Exercises: Stretches   Active Hamstring Stretch 3 reps;20 seconds  each leg.  seated and with strap   Piriformis Stretch 3 reps;20 seconds  diagonal knee to chest and figure 4 in supine   Lumbar Exercises: Aerobic   Elliptical Level 2x10 minutes                PT Education - 10/05/15 1517    Education provided Yes   Education Details figure 4, diagonal knee to chest   Person(s) Educated Patient   Methods Explanation;Demonstration;Handout   Comprehension Verbalized understanding;Returned demonstration          PT Short Term Goals - 10/05/15 1456    PT SHORT TERM GOAL #1   Title be independent in initial HEP   Status Achieved   PT SHORT TERM GOAL #2   Title report a 25% reduction in Rt LE pain with standing and walking   Status Partially Met  10% better   PT SHORT TERM GOAL #3   Title demonstrate correct body mechanics with  getting out of bed and with lifitng for lumbar protection   Status Achieved           PT Long Term Goals - 10/05/15 1457    PT LONG TERM GOAL #1   Title be independent in advanced HEP   Status Achieved   PT LONG TERM GOAL #2   Title reduce FOTO to < or = to 36% limitation   Status Partially Met  37% limitation   PT LONG TERM GOAL #3   Title report a 50% reduction in Rt LE pain with standing and walking   Status Partially Met  10%   PT LONG TERM GOAL #4   Title walk for exercise without Rt LE feeling weak or giving out    Status Achieved               Plan - 10/05/15 1459    Clinical Impression Statement Pt reports 10% reduction in Rt LE symptoms with standing and walking.  Pt with moderate compliance with HEP.  FOTO score is improved to 37% limitation.  Pt has received body mechanics education and is modifying with home tasks.  Pt plans  to transition to gym program at the [facility].     PT Next Visit Plan D/C PT to HEP        Problem List Patient Active Problem List   Diagnosis Date Noted  . Chest pain of uncertain etiology 30/07/2329  . Dyspnea on exertion 12/23/2014  . Sciatica of right side 12/23/2014  . Essential hypertension 12/23/2014  . Hypercholesterolemia 12/23/2014   PHYSICAL THERAPY DISCHARGE SUMMARY  Visits from Start of Care: 4  Current functional level related to goals / functional outcomes: Pt would like to D/C to gym exercises.  See above for current status.     Remaining deficits: Pt with continued Rt LE pain with standing and walking.  Pt has HEP in place to address remaining deficits.     Education / Equipment: HEP Plan: Patient agrees to discharge.  Patient goals were partially met. Patient is being discharged due to being pleased with the current functional level.  ?????     TAKACS,KELLY,PT 10/05/2015, 3:24 PM  [facility] [address], [place], [place], [address] Phone: [phone]   Fax:  [phone]  Name: Todd Kane MRN: [id number] Date of Birth: Mar 23, 1956

## 2015-10-05 NOTE — Patient Instructions (Signed)
Piriformis (Supine)  Cross legs, right on top. Gently pull other knee toward chest until stretch is felt in buttock/hip of top leg. Hold 20____ seconds. Repeat _3___ times per set. Do __1__ sets per session. Do ___1_ sessions per day.   Stretching: Piriformis (Supine)  Pull right knee toward opposite shoulder. Hold _20___ seconds. Relax. Repeat _3___ times per set. Do _1___ sets per session. Do __1__ sessions per day.   [facility] [address], [address] [place], [place] [address] Phone # [phone] Fax [phone]

## 2017-01-17 ENCOUNTER — Encounter: Payer: Self-pay | Admitting: Cardiology

## 2017-01-29 ENCOUNTER — Ambulatory Visit (INDEPENDENT_AMBULATORY_CARE_PROVIDER_SITE_OTHER): Admitting: Cardiology

## 2017-01-29 ENCOUNTER — Encounter (INDEPENDENT_AMBULATORY_CARE_PROVIDER_SITE_OTHER): Payer: Self-pay

## 2017-01-29 ENCOUNTER — Encounter: Payer: Self-pay | Admitting: Cardiology

## 2017-01-29 VITALS — BP 124/74 | HR 54 | Ht 71.0 in | Wt 208.0 lb

## 2017-01-29 DIAGNOSIS — R072 Precordial pain: Secondary | ICD-10-CM

## 2017-01-29 DIAGNOSIS — Z8249 Family history of ischemic heart disease and other diseases of the circulatory system: Secondary | ICD-10-CM | POA: Diagnosis not present

## 2017-01-29 DIAGNOSIS — E7800 Pure hypercholesterolemia, unspecified: Secondary | ICD-10-CM

## 2017-01-29 DIAGNOSIS — I10 Essential (primary) hypertension: Secondary | ICD-10-CM

## 2017-01-29 DIAGNOSIS — R079 Chest pain, unspecified: Secondary | ICD-10-CM | POA: Insufficient documentation

## 2017-01-29 NOTE — Progress Notes (Addendum)
Cardiology Office Note    Date:  01/29/2017   ID:  Todd Royaltyhomas Hunger, DOB 08/25/1956, MRN [id number]  PCP:  Farris HasMORROW, AARON, MD  Cardiologist:  Dr Patty SermonsBrackbill --> Tobias AlexanderKatarina Fallan Mccarey, MD   Chief complain: Chest pain   History of Present Illness:  Todd Kane is a 61 y.o. male previously seen by Dr. Patty SermonsBrackbill in 2016 for chest pains, hypertension hyperlipidemia. He is a retired Medical laboratory scientific officeravy dentist since 2015. The patient has a long-standing history of hyperlipidemia treated with atorvastatin and he also has significant family history of premature coronary artery disease in multiple family members from his mother side as stated in family history section. 2 years ago he underwent an exercise nuclear stress test that was negative for ischemia and had normal LVEF. Today he states that he has been fairly active he goes to gym when his wife and would feel chest pain that's retrosternal pressure-like 4 out of 10, with radiation to his left arm on several occasions mostly at rest. They would last approximately 10 minutes. He usually takes an extra baby aspirin. He denies any palpitations or syncope. He doesn't have side effects from Lipitor.  His most recent blood work was performed on a 24th 2018 showed creatinine 1.0, normal LFTs, hemoglobin A1c 5.8%, triglycerides 73 HDL, 61, LDL 80.  Past Medical History:  Diagnosis Date  . Back pain   . BPH (benign prostatic hyperplasia)   . Chest pain   . Hyperlipidemia   . Hypertension   . OSA (obstructive sleep apnea)   . Spondylolisthesis     Past Surgical History:  Procedure Laterality Date  . MOUTH SURGERY  1982   3rd molers   . TONSILLECTOMY AND ADENOIDECTOMY  1959    Current Medications: Outpatient Medications Prior to Visit  Medication Sig Dispense Refill  . alfuzosin (UROXATRAL) 10 MG 24 hr tablet Take 10 mg by mouth daily with breakfast.    . aspirin 81 MG tablet Take 81 mg by mouth daily.    Marland Kitchen. atorvastatin (LIPITOR) 40 MG tablet Take 40 mg  by mouth daily.    . cholecalciferol (VITAMIN D) 1000 UNITS tablet Take 1,000 Units by mouth daily.    . finasteride (PROSCAR) 5 MG tablet Take 5 mg by mouth daily.  0  . losartan-hydrochlorothiazide (HYZAAR) 100-25 MG per tablet Take 1 tablet by mouth daily.     No facility-administered medications prior to visit.      Allergies:   Oxytetracycline and Sulfa antibiotics   Social History   Social History  . Marital status: Married    Spouse name: N/A  . Number of children: N/A  . Years of education: N/A   Social History Main Topics  . Smoking status: Never Smoker  . Smokeless tobacco: Never Used  . Alcohol use No  . Drug use: No  . Sexual activity: Not Asked   Other Topics Concern  . None   Social History Narrative  . None     Family History:  The patient's family history includes Breast cancer in his mother; COPD in his father and mother; Coronary artery disease in his mother; Heart Problems in his mother; Heart disease in his mother; Other in his brother. Died at 62 sec to breast ca. GM died at 49 sec to MI. Her brothers died of MI in late 4840'.  Brother died of stroke at 6971.   ROS:   Please see the history of present illness.    ROS All other systems reviewed and are  negative.   PHYSICAL EXAM:   VS:  BP 124/74   Pulse (!) 54   Ht 5\' 11"  (1.803 m)   Wt 208 lb (94.3 kg)   BMI 29.01 kg/m    GEN: Well nourished, well developed, in no acute distress  HEENT: normal  Neck: no JVD, carotid bruits, or masses Cardiac: RRR; no murmurs, rubs, or gallops,no edema  Respiratory:  clear to auscultation bilaterally, normal work of breathing GI: soft, nontender, nondistended, + BS MS: no deformity or atrophy  Skin: warm and dry, no rash Neuro:  Alert and Oriented x 3, Strength and sensation are intact Psych: euthymic mood, full affect  Wt Readings from Last 3 Encounters:  01/29/17 208 lb (94.3 kg)  01/11/15 230 lb (104.3 kg)  12/23/14 237 lb 1.9 oz (107.6 kg)       Studies/Labs Reviewed:   EKG:  EKG is ordered today.  The ekg ordered today demonstrates Sinus bradycardia, otherwise normal EKG, unchanged from prior. This was personally reviewed.  Recent Labs: No results found for requested labs within last 8760 hours.   Lipid Panel No results found for: CHOL, TRIG, HDL, CHOLHDL, VLDL, LDLCALC, LDLDIRECT  Additional studies/ records that were reviewed today include:      ASSESSMENT:    1. Precordial pain   2. Hypercholesterolemia   3. Essential hypertension   4. Family history of early CAD      PLAN:  In order of problems listed above:  1. Chest pain - The patient has pain that has some typical and some atypical features, since he has significant family history of premature coronary artery disease also hypertension, hyperlipidemia and negative stress test 2 years ago I would proceed with coronary CTA with potential CT FFR if needed. The patient states that several years ago he had CT scan of chest and abdomen done but commented that he has coronary calcification in his coronary arteries but it wasn't further quantified. 2. Hyperlipidemia  - his lipids are currently at goal, the patient states that he would like to stop cholesterol medicine. I would discourage that as there is evidence of coronary course dictation on CAT scan several years ago, we'll further evaluate his coronary anatomy and coronary artery disease burden and decide on further management.  3.  Hypertension  - controlled on losartan hydrochlorothiazide.  4.  The patient has several questions regarding cardio exercising and training for 5 mile race in September, he is advised that one sees coronary CTA rules out significant obstructive coronary artery disease he should be able to practice running but spread he is training him to at least 12 weeks. His heart rate should not go about 140 bpm.   Medication Adjustments/Labs and Tests Ordered: Current medicines are reviewed at  length with the patient today.  Concerns regarding medicines are outlined above.  Medication changes, Labs and Tests ordered today are listed in the Patient Instructions below. Patient Instructions  Medication Instructions:   Your physician recommends that you continue on your current medications as directed. Please refer to the Current Medication list given to you today.      Testing/Procedures:  CORONARY CTA WITH CT FFR  FOR DR Marka Treloar TO READ      Follow-Up:  Your physician wants you to follow-up in: ONE YEAR WITH DR Johnell Comings will receive a reminder letter in the mail two months in advance. If you don't receive a letter, please call our office to schedule the follow-up appointment.  If you need a refill on your cardiac medications before your next appointment, please call your pharmacy.      Signed, Tobias Alexander, MD  01/29/2017 12:43 PM    [facility] [address] [place], [place], [place]  [address] Phone: [phone]; Fax: [phone]

## 2017-01-29 NOTE — Patient Instructions (Signed)
Medication Instructions:   Your physician recommends that you continue on your current medications as directed. Please refer to the Current Medication list given to you today.      Testing/Procedures:  CORONARY CTA WITH CT FFR  FOR DR NELSON TO READ      Follow-Up:  Your physician wants you to follow-up in: ONE YEAR WITH DR Johnell ComingsNELSON You will receive a reminder letter in the mail two months in advance. If you don't receive a letter, please call our office to schedule the follow-up appointment.        If you need a refill on your cardiac medications before your next appointment, please call your pharmacy.

## 2017-02-04 ENCOUNTER — Encounter: Payer: Self-pay | Admitting: Cardiology

## 2017-02-04 ENCOUNTER — Telehealth: Payer: Self-pay | Admitting: *Deleted

## 2017-02-04 NOTE — Telephone Encounter (Signed)
RE: CARONARY CT WITH CT FFR - NELSON TO READ 02-15-17 @ 9:30 nelson to read

## 2017-02-05 ENCOUNTER — Other Ambulatory Visit: Admitting: *Deleted

## 2017-02-05 DIAGNOSIS — I10 Essential (primary) hypertension: Secondary | ICD-10-CM

## 2017-02-05 NOTE — Progress Notes (Signed)
bmet  

## 2017-02-06 LAB — BASIC METABOLIC PANEL
BUN/Creatinine Ratio: 19 (ref 10–24)
BUN: 18 mg/dL (ref 8–27)
CO2: 27 mmol/L (ref 18–29)
Calcium: 9.4 mg/dL (ref 8.6–10.2)
Chloride: 102 mmol/L (ref 96–106)
Creatinine, Ser: 0.95 mg/dL (ref 0.76–1.27)
GFR calc Af Amer: 98 mL/min/{1.73_m2} (ref >59)
GFR calc non Af Amer: 85 mL/min/{1.73_m2} (ref >59)
Glucose: 87 mg/dL (ref 65–99)
Potassium: 4.8 mmol/L (ref 3.5–5.2)
Sodium: 145 mmol/L — ABNORMAL HIGH (ref 134–144)

## 2017-02-07 ENCOUNTER — Other Ambulatory Visit

## 2017-02-13 ENCOUNTER — Telehealth: Payer: Self-pay | Admitting: Cardiology

## 2017-02-13 NOTE — Telephone Encounter (Signed)
Notified the pt that there is no contraindication with getting a coronary ct done, and traveling via vehicle.  Advised the pt of normal and safe traveling advice.  Educated the pt to walk 5 mins for every hour traveled in a vehicle, to promote good circulation.  Advised the pt to stay plenty hydrated with water while traveling.  Pt verbalized understanding and agrees with this plan.

## 2017-02-13 NOTE — Telephone Encounter (Signed)
Informed the pt that his coronary ct will be over-read by Radiology and a preliminary will be read by a Cardiologist, in absence of Dr Delton See.  Informed the pt that any acute findings noted on his coronary ct, will be reviewed and the pt will be notified.  Pt verbalized understanding.

## 2017-02-13 NOTE — Telephone Encounter (Signed)
Patient calling states that he is scheduled to have a CT on Friday 02-15-17 and is planning to travel to [place] on Sunday. Patient would like to verify that this would be okay,thanks.

## 2017-02-15 ENCOUNTER — Ambulatory Visit (HOSPITAL_COMMUNITY): Payer: TRICARE For Life (TFL)

## 2017-02-22 ENCOUNTER — Telehealth: Payer: Self-pay | Admitting: Cardiology

## 2017-02-22 NOTE — Telephone Encounter (Signed)
Follow Up   Pt returning phone call to set up CT scan. Requesting call back

## 2017-02-26 ENCOUNTER — Other Ambulatory Visit: Payer: Self-pay | Admitting: *Deleted

## 2017-02-26 ENCOUNTER — Encounter: Payer: Self-pay | Admitting: Cardiology

## 2017-02-26 DIAGNOSIS — R0789 Other chest pain: Principal | ICD-10-CM

## 2017-02-26 DIAGNOSIS — R079 Chest pain, unspecified: Secondary | ICD-10-CM

## 2017-02-26 DIAGNOSIS — E7800 Pure hypercholesterolemia, unspecified: Secondary | ICD-10-CM

## 2017-03-08 ENCOUNTER — Ambulatory Visit (HOSPITAL_COMMUNITY): Admission: RE | Admit: 2017-03-08 | Payer: TRICARE For Life (TFL) | Source: Ambulatory Visit

## 2017-03-08 ENCOUNTER — Encounter (HOSPITAL_COMMUNITY): Payer: Self-pay

## 2017-03-08 ENCOUNTER — Ambulatory Visit (HOSPITAL_COMMUNITY)
Admission: RE | Admit: 2017-03-08 | Discharge: 2017-03-08 | Disposition: A | Discharge status: Home / Self Care | Payer: TRICARE For Life (TFL) | Source: Ambulatory Visit | Attending: Cardiology | Admitting: Cardiology

## 2017-03-08 DIAGNOSIS — R079 Chest pain, unspecified: Secondary | ICD-10-CM

## 2017-03-08 DIAGNOSIS — E7800 Pure hypercholesterolemia, unspecified: Secondary | ICD-10-CM | POA: Insufficient documentation

## 2017-03-08 DIAGNOSIS — R0789 Other chest pain: Secondary | ICD-10-CM | POA: Insufficient documentation

## 2017-03-08 MED ORDER — NITROGLYCERIN 0.4 MG SL SUBL
SUBLINGUAL_TABLET | SUBLINGUAL | Status: AC
  Administered 2017-03-08: 0.8 mg via SUBLINGUAL
  Filled 2017-03-08: qty 2

## 2017-03-08 MED ORDER — IOPAMIDOL (ISOVUE-370) INJECTION 76%
INTRAVENOUS | Status: AC
  Administered 2017-03-08: 80 mL
  Filled 2017-03-08: qty 100

## 2017-03-08 MED ORDER — NITROGLYCERIN 0.4 MG SL SUBL
0.8000 mg | SUBLINGUAL_TABLET | Freq: Once | SUBLINGUAL | Status: AC
  Administered 2017-03-08: 0.8 mg via SUBLINGUAL

## 2017-08-29 ENCOUNTER — Telehealth: Payer: Self-pay | Admitting: Cardiology

## 2017-08-29 NOTE — Telephone Encounter (Signed)
Dr. Delton SeeNelson, is Meloxicam contraindicated with this pts meds or cardiac hx? Pt calling to ask if this is safe to take.   Please advise.

## 2017-08-29 NOTE — Telephone Encounter (Signed)
Informed the pt that per Dr. Delton SeeNelson this is safe to take with his other meds and cardiac history. Pt verbalized understanding and agrees with this plan.

## 2017-08-29 NOTE — Telephone Encounter (Signed)
New message  Pt c/o medication issue:  1. Name of Medication: Meloxicam  2. How are you currently taking this medication (dosage and times per day)? 15mg    3. Are you having a reaction (difficulty breathing--STAT)? No   4. What is your medication issue? Per pt would like to know if it would be okay to take the medication. Please call back to discuss

## 2017-08-29 NOTE — Telephone Encounter (Signed)
yes

## 2018-01-14 ENCOUNTER — Encounter: Payer: Self-pay | Admitting: Physician Assistant

## 2018-01-14 ENCOUNTER — Ambulatory Visit (INDEPENDENT_AMBULATORY_CARE_PROVIDER_SITE_OTHER): Admitting: Physician Assistant

## 2018-01-14 VITALS — BP 130/90 | HR 47 | Ht 70.5 in | Wt 206.1 lb

## 2018-01-14 DIAGNOSIS — E7800 Pure hypercholesterolemia, unspecified: Secondary | ICD-10-CM

## 2018-01-14 DIAGNOSIS — I2510 Atherosclerotic heart disease of native coronary artery without angina pectoris: Secondary | ICD-10-CM

## 2018-01-14 DIAGNOSIS — Z8249 Family history of ischemic heart disease and other diseases of the circulatory system: Secondary | ICD-10-CM | POA: Diagnosis not present

## 2018-01-14 DIAGNOSIS — I10 Essential (primary) hypertension: Secondary | ICD-10-CM | POA: Diagnosis not present

## 2018-01-14 MED ORDER — EZETIMIBE 10 MG PO TABS: 10.0000 mg | ORAL_TABLET | Freq: Every day | ORAL | 3 refills | Status: DC

## 2018-01-14 NOTE — Progress Notes (Signed)
Cardiology Office Note    Date:  01/14/2018   ID:  Todd Kane, DOB 10-16-1956, MRN [id number]  PCP:  Todd Has, MD  Cardiologist: Todd Alexander, MD  Chief Complaint  Patient presents with  . Follow-up    History of Present Illness:  Todd Kane is a 62 y.o. 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  Coronary CT 03/2017 calcium score of 107, moderate nonobstructive plaque in the proximal to mid LAD 50-69% stenosis.  Aggressive medical management recommended.  Complains of fluttering on occasion.  Only last a few seconds to minutes.  Kane started drinking caffeine as much as 3 large mugs daily which he never did before. brought his lipid panel and and his LDL was 81.  He is prediabetic with a hemoglobin A1c of 5.7.  He does not watch his diet is well as he should.  His blood pressure medicines have been lowered twice by primary care because of blood pressures of 108 systolic but his blood pressure is elevated today.  He is going back to primary care tomorrow for repeat blood pressure check and adjustment if needed.  Lots of questions concerning follow-up of his coronary disease.  He remains fairly active and denies any chest pain, dyspnea, dyspnea on exertion, dizziness or presyncope.    Past Medical History:  Diagnosis Date  . Back pain   . BPH (benign prostatic hyperplasia)   . Chest pain   . Hyperlipidemia   . Hypertension   . OSA (obstructive sleep apnea)   . Spondylolisthesis     Past Surgical History:  Procedure Laterality Date  . MOUTH SURGERY  1982   3rd molers   . TONSILLECTOMY AND ADENOIDECTOMY  1959    Current Medications: Current Meds  Medication Sig  . alfuzosin (UROXATRAL) 10 MG 24 hr tablet Take 10 mg by mouth daily with breakfast.  . aspirin 81 MG tablet Take 81 mg by mouth  daily.  Marland Kitchen atorvastatin (LIPITOR) 40 MG tablet Take 40 mg by mouth daily.  Marland Kitchen b complex vitamins capsule Take 1 capsule by mouth daily.  . cholecalciferol (VITAMIN D) 1000 UNITS tablet Take 1,000 Units by mouth daily.  . Coenzyme Q10 (COQ-10 PO) Take 100 mg by mouth daily.  . finasteride (PROSCAR) 5 MG tablet Take 5 mg by mouth daily.  Marland Kitchen losartan-hydrochlorothiazide (HYZAAR) 50-12.5 MG tablet Take 1 tablet by mouth daily.     Allergies:   Oxytetracycline and Sulfa antibiotics   Social History   Socioeconomic History  . Marital status: Married    Spouse name: None  . Number of children: None  . Years of education: None  . Highest education level: None  Social Needs  . Financial resource strain: None  . Food insecurity - worry: None  . Food insecurity - inability: None  . Transportation needs - medical: None  . Transportation needs - non-medical: None  Occupational History  . None  Tobacco Use  . Smoking status: Never Smoker  . Smokeless tobacco: Never Used  Substance and Sexual Activity  . Alcohol use: No  . Drug use: No  . Sexual activity: None  Other Topics Concern  . None  Social History Narrative  . None     Family History:  The patient's family history includes Breast cancer in his mother; COPD in his father and mother; Coronary artery disease  in his mother; Heart Problems in his mother; Heart disease in his mother; Other in his brother.   ROS:   Please see the history of present illness.    Review of Systems  Constitution: Negative.  HENT: Negative.   Cardiovascular: Positive for palpitations.  Respiratory: Negative.   Endocrine: Negative.   Hematologic/Lymphatic: Negative.   Musculoskeletal: Negative.         lumbar disc problems with leg pain  Gastrointestinal: Negative.   Genitourinary: Negative.   Neurological: Negative.    All other systems reviewed and are negative.   PHYSICAL EXAM:   VS:  BP 130/90   Pulse (!) 47   Ht 5' 10.5" (1.791 m)   Wt  206 lb 1.9 oz (93.5 kg)   SpO2 98%   BMI 29.16 kg/m   Physical Exam  GEN: Well nourished, well developed, in no acute distress  Neck: no JVD, carotid bruits, or masses Cardiac:RRR; no murmurs, rubs, or gallops  Respiratory:  clear to auscultation bilaterally, normal work of breathing GI: soft, nontender, nondistended, + BS Ext: without cyanosis, clubbing, or edema, Good distal pulses bilaterally Neuro:  Alert and Oriented x 3 Psych: euthymic mood, full affect  Wt Readings from Last 3 Encounters:  01/14/18 206 lb 1.9 oz (93.5 kg)  01/29/17 208 lb (94.3 kg)  01/11/15 230 lb (104.3 kg)      Studies/Labs Reviewed:   EKG:  EKG is  ordered today.  The ekg ordered today demonstrates sinus bradycardia at 47 bpm  Recent Labs: 02/05/2017: BUN 18; Creatinine, Ser 0.95; Potassium 4.8; Sodium 145   Lipid Panel No results found for: CHOL, TRIG, HDL, CHOLHDL, VLDL, LDLCALC, LDLDIRECT  Additional studies/ records that were reviewed today include:  Coronary CT 03/08/17 Aorta:  Normal size.  No calcifications.  No dissection.   Aortic Valve:  Trileaflet.  No calcifications.   Coronary Arteries:  Normal coronary origin.  Right dominance.   RCA is a very large dominant artery that gives rise to PDA and PLVB. There is minimal calcified plaque in the distal RCA associated with 0-25% stenosis.   Left main is a very short artery that gives rise to LAD and LCX arteries.   LAD is a large vessel that Kane mild calcified plaque in the proximal portion associated with 25-50% stenosis. There is another moderate non-calcified plaque in the proximal to mid LAD associated with 50-69% stenosis. Mid LAD Kane mild calcified plaque associated with 25-50% stenosis. Distal LAD Kane small lumen.   D1 is very small with mild ostial calcified plaque associated with 25-50% stenosis.   D2 and D3 are small arteries with no significant plaque.   LCX is a small non-dominant artery that gives rise to one  OM1 branch. There is minimal calcified plaque in the mid LCX associated with 0-25% stenosis.   Other findings:   Normal pulmonary vein drainage into the left atrium.   Normal let atrial appendage without a thrombus.   IMPRESSION: 1. Coronary calcium score of 107. This was 2061 percentile for age and sex matched control.   2. Normal coronary origin with right dominance.   3. Moderate nonobstructive plaque in proximal to mid LAD. An aggressive risk factor modification is recommended.      ASSESSMENT:    1. Coronary artery calcification seen on CAT scan   2. Essential hypertension   3. Hypercholesterolemia   4. Family history of early CAD      PLAN:  In order of problems listed above:  Coronary artery calcification seen on CT scan 03/2017 moderate nonobstructive LAD disease.  Aggressive medical management recommended.  Recent LDL 81.  Recommend Zetia 10 mg daily in addition to his Lipitor.  We will not increase Lipitor because of prediabetes.  Return in 6 weeks for repeat lipid panel and LFTs.  Needs better blood pressure control.  He like to talk to his primary care tomorrow.  Follow-up with Dr. Delton See in 1 year.  Essential hypertension pressure elevated today since cutting back on his Hyzaar.  Recommend increasing to 100/12.5 mg once daily.  He will discuss with primary care tomorrow.  Hypercholesterolemia adding Zetia 10 mg once daily as discussed above.  Family history of early CAD  Palpitations with occasional fluttering.  This only happens several times a month.  Does not happen with exercise.  Recommend cutting back on his caffeine and if he continues to have palpitations can place an event monitor.  Medication Adjustments/Labs and Tests Ordered: Current medicines are reviewed at length with the patient today.  Concerns regarding medicines are outlined above.  Medication changes, Labs and Tests ordered today are listed in the Patient Instructions below. Patient  Instructions  Medication Instructions:  Your physician Kane recommended you make the following change in your medication:  1.  START Zetia 10 mg taking 1 tablet daily I do recommend you increasing the Hyzaar back to 100-12.5 but understand that youw ant to discuss with your PCP.  Labwork: 6 WEEKS:  FASTING LIPID & LFT  Testing/Procedures: None ordered  Follow-Up: Your physician wants you to follow-up in: 1 YEAR WITH DR. Johnell Comings will receive a reminder letter in the mail two months in advance. If you don't receive a letter, please call our office to schedule the follow-up appointment.   Any Other Special Instructions Will Be Listed Below (If Applicable).     If you need a refill on your cardiac medications before your next appointment, please call your pharmacy.      Signed, Jacolyn Reedy, PA-C  01/14/2018 10:14 AM    [facility] [address] [place], [place], [place]  [address] Phone: [phone]; Fax: [phone]

## 2018-01-14 NOTE — Patient Instructions (Addendum)
Medication Instructions:  Your physician has recommended you make the following change in your medication:  1.  START Zetia 10 mg taking 1 tablet daily I do recommend you increasing the Hyzaar back to 100-12.5 but understand that youw ant to discuss with your PCP.  Labwork: 6 WEEKS:  FASTING LIPID & LFT  Testing/Procedures: None ordered  Follow-Up: Your physician wants you to follow-up in: 1 YEAR WITH DR. Johnell ComingsNELSON  You will receive a reminder letter in the mail two months in advance. If you don't receive a letter, please call our office to schedule the follow-up appointment.   Any Other Special Instructions Will Be Listed Below (If Applicable).     If you need a refill on your cardiac medications before your next appointment, please call your pharmacy.

## 2018-02-24 ENCOUNTER — Other Ambulatory Visit: Admitting: *Deleted

## 2018-02-24 DIAGNOSIS — I2510 Atherosclerotic heart disease of native coronary artery without angina pectoris: Secondary | ICD-10-CM

## 2018-02-24 DIAGNOSIS — E7800 Pure hypercholesterolemia, unspecified: Secondary | ICD-10-CM

## 2018-02-24 DIAGNOSIS — Z8249 Family history of ischemic heart disease and other diseases of the circulatory system: Secondary | ICD-10-CM

## 2018-02-24 DIAGNOSIS — I10 Essential (primary) hypertension: Secondary | ICD-10-CM

## 2018-02-24 LAB — LIPID PANEL
Chol/HDL Ratio: 1.9 ratio (ref 0.0–5.0)
Cholesterol, Total: 120 mg/dL (ref 100–199)
HDL: 62 mg/dL (ref >39)
LDL Calculated: 49 mg/dL (ref 0–99)
Triglycerides: 46 mg/dL (ref 0–149)
VLDL Cholesterol Cal: 9 mg/dL (ref 5–40)

## 2018-02-24 LAB — HEPATIC FUNCTION PANEL
ALT: 31 IU/L (ref 0–44)
AST: 24 IU/L (ref 0–40)
Albumin: 4.1 g/dL (ref 3.6–4.8)
Alkaline Phosphatase: 51 IU/L (ref 39–117)
Bilirubin Total: 1 mg/dL (ref 0.0–1.2)
Bilirubin, Direct: 0.31 mg/dL (ref 0.00–0.40)
Total Protein: 6.4 g/dL (ref 6.0–8.5)

## 2018-03-05 ENCOUNTER — Telehealth: Payer: Self-pay | Admitting: Cardiology

## 2018-03-05 NOTE — Telephone Encounter (Signed)
Notes recorded by Lars Masson, MD on 03/03/2018 at 10:11 PM EDT This recommendation only applies to people who dont have any evidence for CAD, since he has moderate CAD he has to stay on ASA. His benefit from it is higher than risk of GI Bleeding ------  Notes recorded by Loa Socks, LPN on 0/98/1191 at 6:19 PM EDT Pt aware of normal lipids and LFTs per Dr Delton See.  Pt did however, read an article about taking ASA 81 mg po daily, and increased risk of GI bleed. Pt wanted to make sure with Dr Delton See that he should continue on low dose ASA with his cardiac history. Informed the pt that I will route this request to Dr Delton See to review and advise on, and follow-up shortly thereafter. Pt verbalized understanding and agrees with this plan.   Informed the pt of Dr Lindaann Slough recommendations about taking ASA 81 mg po daily.  Advised the pt to continue his ASA regimen as is.  Pt verbalized understanding and agrees with this plan.

## 2018-03-05 NOTE — Telephone Encounter (Signed)
New message   Patient returning call to discuss lab results and recommendation from physician.

## 2018-09-18 ENCOUNTER — Telehealth: Payer: Self-pay | Admitting: Cardiology

## 2018-09-18 NOTE — Telephone Encounter (Signed)
New Message         Patient is having a colonoscopy and he has questions about his blood pressure  Medication.

## 2018-09-18 NOTE — Telephone Encounter (Signed)
Pt calling in to ask if its ok for him to take his Hyzaar the morning of his colonoscopy, for his GI MD advised him to do so.  Pt states he usually takes this med on a nightly basis. Advised the pt that its perfectly safe for him to take this regimen in the AM on the morning of his colonoscopy, then the following day, he may take his hyzaar at his regular nightly schedule.  Pt verbalized understanding and agree with this plan.  Also while having the pt on the phone, I went ahead and scheduled his 1 year follow-up appt with Dr Delton SeeNelson for 01/05/2019 at 0840.  Advised the pt to arrive 15 mins prior to this appt. Pt verbalized understanding and agrees with this plan.

## 2018-10-20 ENCOUNTER — Other Ambulatory Visit: Payer: Self-pay | Admitting: Neurological Surgery

## 2018-10-23 ENCOUNTER — Telehealth: Payer: Self-pay | Admitting: *Deleted

## 2018-10-23 NOTE — Telephone Encounter (Signed)
   [facility] Pre-operative Risk Assessment    Request for surgical clearance:  1. What type of surgery is being performed? L3-4, L4-5 LUMBAR FUSION W/L3-5 FIXATION  2. When is this surgery scheduled? 12/17/18   3. What type of clearance is required (medical clearance vs. Pharmacy clearance to hold med vs. Both)? MEDICAL  4. Are there any medications that need to be held prior to surgery and how long?ASA   5. Practice name and name of physician performing surgery? [facility]; DR. Sherley Bounds   6. What is your office phone number [phone]    7.   What is your office fax number [phone]  8.   Anesthesia type (None, local, MAC, general) ? GENERAL   Todd Kane 10/23/2018, 2:45 PM  _________________________________________________________________   (provider comments below)

## 2018-10-24 NOTE — Telephone Encounter (Signed)
He can hold aspirin 10 days prior to the procedure start as soon as acceptable from surgical standpoint.

## 2018-10-24 NOTE — Telephone Encounter (Signed)
1st attempt to reach pt re: surgical clearance and the need for an appt before clearance can be completed. Left detailed message for pt to call and make an appt.

## 2018-10-24 NOTE — Telephone Encounter (Signed)
   Primary Cardiologist:Katarina Delton SeeNelson, MD  Chart reviewed as part of pre-operative protocol coverage. Because of Todd Kane's past medical history and time since last visit, Todd Kane will require a follow-up visit in order to better assess preoperative cardiovascular risk.  Pre-op covering staff: - Please schedule appointment and call patient to inform them. - Please contact requesting surgeon's office via preferred method (i.e, phone, fax) to inform them of need for appointment prior to surgery.  If applicable, this message will also be routed to pharmacy pool and/or primary cardiologist for input on holding anticoagulant/antiplatelet agent as requested below so that this information is available at time of patient's appointment.   Robbie LisBrittainy Martavion Couper, PA-C  10/24/2018, 11:09 AM

## 2018-10-24 NOTE — Telephone Encounter (Signed)
Pt has been scheduled to see Ronie Spiesayna Dunn, PA-C, 11/21/18.

## 2018-10-28 ENCOUNTER — Telehealth: Payer: Self-pay | Admitting: Cardiology

## 2018-10-28 NOTE — Telephone Encounter (Signed)
Pt calling in to report to Dr Delton SeeNelson that after his exercise regimen last night, he started to develop really "vague" chest discomfort.  Pt states he worked out as usual, and also did some planks during his work out. Pt states that a few mins later, after he did his exercise regimen, he began to have a strange sensation on the left side of his chest.  Pt states its more of a dull pain to the left side of his chest.  Pt states that it did travel from his left arm up into his left chest area.  Pt states he had no radiation pain to his jaw area.  Pt states only his left hand felt very clammy/sweaty, but no diaphoresis noted all over his body. Pt states he had no N/V, no crushing chest pain, or feeling pressure in his chest.  Pt states he was not dizzy or lightheaded, and did not feel pre-syncopal and had no syncopal episodes.  Pt reports he was never SOB, and never had DOE.  Pt reports he did not feel any palpitations, or any irregularity to his heart rate or rhythm. While on the phone, I asked the pt to push into the area of his chest that felt discomfort, and he replied that it is mildly sore to touch in that area.  Pt is wanting for Dr Delton SeeNelson to advise on what he should do about this.  He has an appt with Ronie Spiesayna Dunn PA-C for 11/21/18, for follow-up and needing cardiac clearance for upcoming surgery.  There are no APP appts available til around that time.  Pt reports that at this current time, he feels "98% well." Endorsed to the pt that Dr Delton SeeNelson is out of the office but I will route this message to her for further review and recommendation, then follow-up with the pt shortly thereafter.  Pt verbalized understanding and agrees with this plan.

## 2018-10-28 NOTE — Telephone Encounter (Signed)
° ° ° ° ° °  1. Are you having CP right now? Patient states it is not "pain" just a vague, dull feeling in his chest. Starting after exercise on 12/23  2. Are you experiencing any other symptoms (ex. SOB, nausea, vomiting, sweating)? no  3. How long have you been experiencing CP? 1 day  4. Is your CP continuous or coming and going?  Coming and going  5. Have you taken Nitroglycerin? no ?

## 2018-10-28 NOTE — Telephone Encounter (Signed)
He should go to the ER if his symptoms are recurrent.  Merry Christmas!

## 2018-10-28 NOTE — Telephone Encounter (Signed)
Spoke with the pt and endorsed to him that per Dr Delton SeeNelson, if his symptoms reoccur, [facility] should [facility]. Advised the pt to hold off on exercising for the next couple of days, to see if this helps his complaints or not.  Pt education provided on alarming signs and symptoms that warrant a heart attack and when to seek immediate medical attention, like 911/ EMS.  Advised the pt that we will follow-up with him as planned for 11/21/18, with Dayna Dunn PA-C. Advised the pt to call the office back with any questions or concerns regarding this.  Pt verbalized understanding and agreed with this plan.  Pt states he feels much more at ease, since talking to us.

## 2018-11-19 ENCOUNTER — Telehealth: Payer: Self-pay | Admitting: Cardiology

## 2018-11-19 NOTE — Progress Notes (Signed)
Cardiology Office Note    Date:  11/21/2018  ID:  [name], DOB 1956-05-13, MRN [id number] PCP:  Todd Has, MD  Cardiologist:  Tobias Alexander, MD   Chief Complaint: f/u chest discomfort  History of Present Illness:  Todd Kane is a 63 y.o. 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 He had prior nuclear stress test in 2016 that was normal. Coronary CT 03/2017 showed 59-69% stenosis of the mid LAD, 25-50% mid LAD, 25-50% prox LAD, 0-25% distal RCA, 25-50% D1, 0-25% mid Cx. Risk factor modification was recommended. He saw Todd Gauze PA-C in follow-up 01/2018 and reported palpitations in the setting of caffeine. Reduction in caffeine was suggested with plan for event monitor if this persisted. Our office was contacted for surgical clearance for lumbar surgery which appointment was recommended, but the patient Kane since elected to defer the surgery until the summer. He still wished to keep the appointment. Last labs 02/2018 showed LDL 49, LFTs wnl, 02/2017 BMET showed Cr 0.95, K 4.8 in Epic. [facility] labs from 12/2017 showed K 4.1, Cr 1.050.  He returns for follow-up overall feeling well. He had an episode of chest pressure at rest right around Christmastime in the middle of his chest that lasted several hours. It was not provoked by anything or made worse by anything. He went onto bed as usual. He still felt it when he woke up but it gradually subsided on his own. He felt as though his arm might be sweating which seemed odd. He was not SOB or diaphoretic. He wasn't sure what to make of it. He Kane since been pain free. This pain Kane happened in the past as well, prompting prior cardiac workup. He's fallen out of regular exercise with the holidays but Kane exerted himself recently without reproduction of symptoms. He notices some DOE with higher levels of activity but feels this is appropriate  for the lack of exercise, and states he feels better the longer he pushes through it. He reports some tingling in his legs at times but no claudication or non-healing sores. His palpitations are extremely infrequent, so much so that he can't even quantify how often they happen- described as a brief flutter.   Past Medical History:  Diagnosis Date  . Back pain   . BPH (benign prostatic hyperplasia)   . CAD (coronary artery disease)    a. mild-mod by cardiac CT 03/2017.  Marland Kitchen Chest pain   . Hyperlipidemia   . Hypertension   . OSA (obstructive sleep apnea)   . Pre-diabetes   . Spondylolisthesis     Past Surgical History:  Procedure Laterality Date  . MOUTH SURGERY  1982   3rd molers   . TONSILLECTOMY AND ADENOIDECTOMY  1959    Current Medications: Current Meds  Medication Sig  . alfuzosin (UROXATRAL) 10 MG 24 hr tablet Take 10 mg by mouth daily with breakfast.  . aspirin 81 MG tablet Take 81 mg by mouth daily.  Marland Kitchen atorvastatin (LIPITOR) 40 MG tablet Take 40 mg by mouth daily.  . cholecalciferol (VITAMIN D) 1000 UNITS tablet Take 1,000 Units by mouth daily.  . Coenzyme Q10 (COQ-10 PO) Take 100 mg by mouth daily.  Marland Kitchen ezetimibe (ZETIA) 10 MG tablet Take 1 tablet (10 mg total) by mouth daily.  . finasteride (PROSCAR) 5 MG tablet Take 5 mg by mouth daily.  Marland Kitchen losartan-hydrochlorothiazide (HYZAAR) 50-12.5 MG tablet Take 1  tablet by mouth daily.      Allergies:   Oxytetracycline and Sulfa antibiotics   Social History   Socioeconomic History  . Marital status: Married    Spouse name: Not on file  . Number of children: Not on file  . Years of education: Not on file  . Highest education level: Not on file  Occupational History  . Not on file  Social Needs  . Financial resource strain: Not on file  . Food insecurity:    Worry: Not on file    Inability: Not on file  . Transportation needs:    Medical: Not on file    Non-medical: Not on file  Tobacco Use  . Smoking status: Never  Smoker  . Smokeless tobacco: Never Used  Substance and Sexual Activity  . Alcohol use: No  . Drug use: No  . Sexual activity: Not on file  Lifestyle  . Physical activity:    Days per week: Not on file    Minutes per session: Not on file  . Stress: Not on file  Relationships  . Social connections:    Talks on phone: Not on file    Gets together: Not on file    Attends religious service: Not on file    Active member of club or organization: Not on file    Attends meetings of clubs or organizations: Not on file    Relationship status: Not on file  Other Topics Concern  . Not on file  Social History Narrative  . Not on file     Family History:  The patient's family history includes Breast cancer in his mother; COPD in his father and mother; Coronary artery disease in his mother; Heart Problems in his mother; Heart disease in his mother; Other in his brother.  ROS:   Please see the history of present illness. No syncope. All other systems are reviewed and otherwise negative.    PHYSICAL EXAM:   VS:  BP 120/86   Pulse (!) 56   Ht 5' 10.5" (1.791 m)   Wt 212 lb 12.8 oz (96.5 kg)   BMI 30.10 kg/m   BMI: Body mass index is 30.1 kg/m. GEN: Well nourished, well developed WM, in no acute distress HEENT: normocephalic, atraumatic Neck: no JVD, carotid bruits, or masses Cardiac: RRR; no murmurs, rubs, or gallops, no edema, excellent pedal pulses bilaterally Respiratory:  clear to auscultation bilaterally, normal work of breathing GI: soft, nontender, nondistended, + BS MS: no deformity or atrophy Skin: warm and dry, no rash Neuro:  Alert and Oriented x 3, Strength and sensation are intact, follows commands Psych: euthymic mood, full affect  Wt Readings from Last 3 Encounters:  11/21/18 212 lb 12.8 oz (96.5 kg)  01/14/18 206 lb 1.9 oz (93.5 kg)  01/29/17 208 lb (94.3 kg)      Studies/Labs Reviewed:   EKG:  EKG was ordered today and personally reviewed by me and  demonstrates sinus bradycardia 56bpm otherwise normal EKG.  Recent Labs: 02/24/2018: ALT 31   Lipid Panel    Component Value Date/Time   CHOL 120 02/24/2018 0823   TRIG 46 02/24/2018 0823   HDL 62 02/24/2018 0823   CHOLHDL 1.9 02/24/2018 0823   LDLCALC 49 02/24/2018 0823    Additional studies/ records that were reviewed today include: Summarized above.  ASSESSMENT & PLAN:   1. Chest pain - given his upcoming surgery in June and known CAD, will evaluate with Myoview. He believes he will be  able to walk on the treadmill for this, will arrange. From a surgical clearance standpoint if this is normal he would be able to be cleared for his lumbar surgery. However, surgery is so far away that he would require a call to check up on him later this spring per our protocol. I told him to please have his surgeon send over a repeat clearance closer to the time of surgery so that we can re-evaluate. Dr. Delton See Kane already cleared him to stop ASA for 10 days prior to surgery if needed in a previous phone note. 2. CAD - eval as above. Continue present regimen. He Kane baseline sinus bradycardia which is chronic for him. 3. Essential HTN - controlled. Regular labs followed by PCP. 4. Hyperlipidemia - will be due for lipids in 02/2019 but he states he'll be having routine physical bloodwork in 12/2018 at which time he will request lipids be checked. Would advise PCP reach out of if any questions. Should continue statin for goal LDL <70. 5. Palpitations - extremely infrequent, not bothersome. Baseline sinus bradycardia precludes empiric treatment nor do his symptoms currently warrant this. Discussed Kardia/apple band as a way to monitor.  Disposition: F/u with Dr. Delton See in 1 year, sooner if needed.  Medication Adjustments/Labs and Tests Ordered: Current medicines are reviewed at length with the patient today.  Concerns regarding medicines are outlined above. Medication changes, Labs and Tests ordered today  are summarized above and listed in the Patient Instructions accessible in Encounters.   Signed, Laurann Montana, PA-C  11/21/2018 9:10 AM    [facility] [address] [place], [place], [place]  [address] Phone: [phone]; Fax: [phone]

## 2018-11-19 NOTE — Telephone Encounter (Signed)
New message  ° °No message needed °

## 2018-11-21 ENCOUNTER — Encounter: Payer: Self-pay | Admitting: Physician Assistant

## 2018-11-21 ENCOUNTER — Encounter: Payer: Self-pay | Admitting: *Deleted

## 2018-11-21 ENCOUNTER — Ambulatory Visit (INDEPENDENT_AMBULATORY_CARE_PROVIDER_SITE_OTHER): Admitting: Physician Assistant

## 2018-11-21 ENCOUNTER — Telehealth: Payer: Self-pay

## 2018-11-21 VITALS — BP 120/86 | HR 56 | Ht 70.5 in | Wt 212.8 lb

## 2018-11-21 DIAGNOSIS — I10 Essential (primary) hypertension: Secondary | ICD-10-CM

## 2018-11-21 DIAGNOSIS — E785 Hyperlipidemia, unspecified: Secondary | ICD-10-CM

## 2018-11-21 DIAGNOSIS — R002 Palpitations: Secondary | ICD-10-CM

## 2018-11-21 DIAGNOSIS — R079 Chest pain, unspecified: Secondary | ICD-10-CM | POA: Diagnosis not present

## 2018-11-21 DIAGNOSIS — I2510 Atherosclerotic heart disease of native coronary artery without angina pectoris: Secondary | ICD-10-CM | POA: Diagnosis not present

## 2018-11-21 DIAGNOSIS — R0789 Other chest pain: Secondary | ICD-10-CM

## 2018-11-21 NOTE — Patient Instructions (Addendum)
Medication Instructions:  Your physician recommends that you continue on your current medications as directed. Please refer to the Current Medication list given to you today.  If you need a refill on your cardiac medications before your next appointment, please call your pharmacy.   Lab work: None ordered  If you have labs (blood work) drawn today and your tests are completely normal, you will receive your results only by: Marland Kitchen MyChart Message (if you have MyChart) OR . A paper copy in the mail If you have any lab test that is abnormal or we need to change your treatment, we will call you to review the results.  Testing/Procedures: Your physician has requested that you have en exercise stress myoview. For further information please visit [email or web address] Please follow instruction sheet, as given.    Follow-Up: Your physician wants you to follow-up in: 1 YEAR WITH DR. Johnell Comings will receive a reminder letter in the mail two months in advance. If you don't receive a letter, please call our office to schedule the follow-up appointment.    Any Other Special Instructions Will Be Listed Below (If Applicable).

## 2018-11-21 NOTE — Telephone Encounter (Signed)
Received surgical clearance from [facility] for 12/17/18 surgery but according to the pt and chart pt is having his surgery planned for 04/2019... Pt saw Ronie Spies PA today and I faxed the clearance request and office note back to them re: needed new clearance closer to the surgery date.. pt also planning to have Myoview.   Fax [phone]

## 2018-12-09 ENCOUNTER — Other Ambulatory Visit: Payer: Self-pay | Admitting: Physician Assistant

## 2018-12-11 ENCOUNTER — Telehealth (HOSPITAL_COMMUNITY): Payer: Self-pay | Admitting: *Deleted

## 2018-12-11 ENCOUNTER — Encounter (HOSPITAL_COMMUNITY): Payer: Self-pay | Admitting: *Deleted

## 2018-12-11 NOTE — Telephone Encounter (Signed)
Attempted to call patient regarding upcoming appointment- busy, unable to reach.  Letter sent via mychart with instructions for his nuclear stress test.  Todd Kane

## 2018-12-16 ENCOUNTER — Ambulatory Visit (HOSPITAL_COMMUNITY): Payer: Medicare Other | Attending: Cardiology

## 2018-12-16 DIAGNOSIS — R0789 Other chest pain: Secondary | ICD-10-CM | POA: Diagnosis not present

## 2018-12-16 LAB — MYOCARDIAL PERFUSION IMAGING
CHL CUP NUCLEAR SDS: 2
CSEPPHR: 151 {beats}/min
Estimated workload: 12.5 METS
Exercise duration (min): 10 min
Exercise duration (sec): 30 s
LV dias vol: 76 mL (ref 62–150)
LV sys vol: 28 mL
MPHR: 156 {beats}/min
Percent HR: 97 %
Rest HR: 49 {beats}/min
SRS: 0
SSS: 2
TID: 0.78

## 2018-12-16 MED ORDER — TECHNETIUM TC 99M TETROFOSMIN IV KIT
32.9000 | PACK | Freq: Once | INTRAVENOUS | Status: AC | PRN
  Administered 2018-12-16: 32.9 via INTRAVENOUS
  Filled 2018-12-16: qty 33

## 2018-12-16 MED ORDER — TECHNETIUM TC 99M TETROFOSMIN IV KIT
10.2000 | PACK | Freq: Once | INTRAVENOUS | Status: AC | PRN
  Administered 2018-12-16: 10.2 via INTRAVENOUS
  Filled 2018-12-16: qty 11

## 2018-12-31 DIAGNOSIS — Z125 Encounter for screening for malignant neoplasm of prostate: Secondary | ICD-10-CM | POA: Diagnosis not present

## 2018-12-31 DIAGNOSIS — R202 Paresthesia of skin: Secondary | ICD-10-CM | POA: Diagnosis not present

## 2018-12-31 DIAGNOSIS — Z0000 Encounter for general adult medical examination without abnormal findings: Secondary | ICD-10-CM | POA: Diagnosis not present

## 2018-12-31 DIAGNOSIS — M79606 Pain in leg, unspecified: Secondary | ICD-10-CM | POA: Diagnosis not present

## 2018-12-31 DIAGNOSIS — Z23 Encounter for immunization: Secondary | ICD-10-CM | POA: Diagnosis not present

## 2018-12-31 DIAGNOSIS — E785 Hyperlipidemia, unspecified: Secondary | ICD-10-CM | POA: Diagnosis not present

## 2018-12-31 DIAGNOSIS — M4310 Spondylolisthesis, site unspecified: Secondary | ICD-10-CM | POA: Diagnosis not present

## 2018-12-31 DIAGNOSIS — I10 Essential (primary) hypertension: Secondary | ICD-10-CM | POA: Diagnosis not present

## 2018-12-31 DIAGNOSIS — M549 Dorsalgia, unspecified: Secondary | ICD-10-CM | POA: Diagnosis not present

## 2019-01-05 ENCOUNTER — Other Ambulatory Visit: Payer: Self-pay | Admitting: Family Medicine

## 2019-01-05 ENCOUNTER — Ambulatory Visit: Admitting: Cardiology

## 2019-01-08 ENCOUNTER — Other Ambulatory Visit: Payer: Self-pay | Admitting: Family Medicine

## 2019-01-08 DIAGNOSIS — M79606 Pain in leg, unspecified: Secondary | ICD-10-CM

## 2019-01-13 ENCOUNTER — Ambulatory Visit
Admission: RE | Admit: 2019-01-13 | Discharge: 2019-01-13 | Disposition: A | Discharge status: Home / Self Care | Payer: Medicare Other | Source: Ambulatory Visit | Attending: Family Medicine | Admitting: Family Medicine

## 2019-01-13 DIAGNOSIS — I10 Essential (primary) hypertension: Secondary | ICD-10-CM | POA: Diagnosis not present

## 2019-01-13 DIAGNOSIS — E785 Hyperlipidemia, unspecified: Secondary | ICD-10-CM | POA: Diagnosis not present

## 2019-01-13 DIAGNOSIS — M79606 Pain in leg, unspecified: Secondary | ICD-10-CM

## 2019-02-10 DIAGNOSIS — R202 Paresthesia of skin: Secondary | ICD-10-CM | POA: Diagnosis not present

## 2019-02-10 DIAGNOSIS — R7309 Other abnormal glucose: Secondary | ICD-10-CM | POA: Diagnosis not present

## 2019-04-29 ENCOUNTER — Inpatient Hospital Stay: Admit: 2019-04-29 | Admitting: Neurological Surgery

## 2019-04-29 SURGERY — ANTERIOR LATERAL LUMBAR FUSION 1 LEVEL
Anesthesia: General | Site: Back

## 2019-07-10 DIAGNOSIS — R109 Unspecified abdominal pain: Secondary | ICD-10-CM | POA: Diagnosis not present

## 2019-07-10 DIAGNOSIS — M79646 Pain in unspecified finger(s): Secondary | ICD-10-CM | POA: Diagnosis not present

## 2019-07-10 DIAGNOSIS — R252 Cramp and spasm: Secondary | ICD-10-CM | POA: Diagnosis not present

## 2019-07-10 DIAGNOSIS — Z23 Encounter for immunization: Secondary | ICD-10-CM | POA: Diagnosis not present

## 2019-07-10 DIAGNOSIS — R1909 Other intra-abdominal and pelvic swelling, mass and lump: Secondary | ICD-10-CM | POA: Diagnosis not present

## 2019-10-07 DIAGNOSIS — R109 Unspecified abdominal pain: Secondary | ICD-10-CM | POA: Diagnosis not present

## 2019-10-13 ENCOUNTER — Other Ambulatory Visit: Payer: Self-pay | Admitting: Family Medicine

## 2019-10-13 DIAGNOSIS — R109 Unspecified abdominal pain: Secondary | ICD-10-CM

## 2019-10-15 DIAGNOSIS — M4316 Spondylolisthesis, lumbar region: Secondary | ICD-10-CM | POA: Diagnosis not present

## 2019-10-15 DIAGNOSIS — I10 Essential (primary) hypertension: Secondary | ICD-10-CM | POA: Diagnosis not present

## 2019-10-15 DIAGNOSIS — Z6829 Body mass index (BMI) 29.0-29.9, adult: Secondary | ICD-10-CM | POA: Diagnosis not present

## 2019-10-20 DIAGNOSIS — K4090 Unilateral inguinal hernia, without obstruction or gangrene, not specified as recurrent: Secondary | ICD-10-CM | POA: Diagnosis not present

## 2019-10-28 ENCOUNTER — Ambulatory Visit
Admission: RE | Admit: 2019-10-28 | Discharge: 2019-10-28 | Disposition: A | Discharge status: Home / Self Care | Payer: Medicare Other | Source: Ambulatory Visit | Attending: Family Medicine | Admitting: Family Medicine

## 2019-10-28 DIAGNOSIS — R109 Unspecified abdominal pain: Secondary | ICD-10-CM

## 2019-12-03 ENCOUNTER — Ambulatory Visit: Payer: Medicare Other

## 2019-12-11 ENCOUNTER — Ambulatory Visit: Payer: Medicare Other

## 2019-12-14 ENCOUNTER — Ambulatory Visit: Payer: Medicare Other

## 2019-12-14 ENCOUNTER — Ambulatory Visit: Payer: Medicare Other | Admitting: Cardiology

## 2020-01-21 DIAGNOSIS — N529 Male erectile dysfunction, unspecified: Secondary | ICD-10-CM | POA: Diagnosis not present

## 2020-01-21 DIAGNOSIS — E785 Hyperlipidemia, unspecified: Secondary | ICD-10-CM | POA: Diagnosis not present

## 2020-01-21 DIAGNOSIS — Z0000 Encounter for general adult medical examination without abnormal findings: Secondary | ICD-10-CM | POA: Diagnosis not present

## 2020-01-21 DIAGNOSIS — R7309 Other abnormal glucose: Secondary | ICD-10-CM | POA: Diagnosis not present

## 2020-01-21 DIAGNOSIS — M4310 Spondylolisthesis, site unspecified: Secondary | ICD-10-CM | POA: Diagnosis not present

## 2020-01-21 DIAGNOSIS — M79606 Pain in leg, unspecified: Secondary | ICD-10-CM | POA: Diagnosis not present

## 2020-01-21 DIAGNOSIS — Z125 Encounter for screening for malignant neoplasm of prostate: Secondary | ICD-10-CM | POA: Diagnosis not present

## 2020-01-21 DIAGNOSIS — R202 Paresthesia of skin: Secondary | ICD-10-CM | POA: Diagnosis not present

## 2020-01-21 DIAGNOSIS — I10 Essential (primary) hypertension: Secondary | ICD-10-CM | POA: Diagnosis not present

## 2020-01-21 DIAGNOSIS — M549 Dorsalgia, unspecified: Secondary | ICD-10-CM | POA: Diagnosis not present

## 2020-02-04 DIAGNOSIS — R7309 Other abnormal glucose: Secondary | ICD-10-CM | POA: Diagnosis not present

## 2020-02-04 DIAGNOSIS — R202 Paresthesia of skin: Secondary | ICD-10-CM | POA: Diagnosis not present

## 2020-02-04 DIAGNOSIS — Z0000 Encounter for general adult medical examination without abnormal findings: Secondary | ICD-10-CM | POA: Diagnosis not present

## 2020-02-04 DIAGNOSIS — Z125 Encounter for screening for malignant neoplasm of prostate: Secondary | ICD-10-CM | POA: Diagnosis not present

## 2020-02-04 DIAGNOSIS — Z79899 Other long term (current) drug therapy: Secondary | ICD-10-CM | POA: Diagnosis not present

## 2020-02-04 DIAGNOSIS — E785 Hyperlipidemia, unspecified: Secondary | ICD-10-CM | POA: Diagnosis not present

## 2020-02-04 DIAGNOSIS — I10 Essential (primary) hypertension: Secondary | ICD-10-CM | POA: Diagnosis not present

## 2020-02-09 ENCOUNTER — Encounter: Payer: Self-pay | Admitting: Cardiology

## 2020-02-09 ENCOUNTER — Ambulatory Visit (INDEPENDENT_AMBULATORY_CARE_PROVIDER_SITE_OTHER): Payer: Medicare Other | Admitting: Cardiology

## 2020-02-09 ENCOUNTER — Other Ambulatory Visit: Payer: Self-pay

## 2020-02-09 VITALS — BP 126/80 | HR 65 | Ht 70.0 in | Wt 195.2 lb

## 2020-02-09 DIAGNOSIS — E785 Hyperlipidemia, unspecified: Secondary | ICD-10-CM | POA: Diagnosis not present

## 2020-02-09 DIAGNOSIS — R002 Palpitations: Secondary | ICD-10-CM | POA: Diagnosis not present

## 2020-02-09 DIAGNOSIS — I2510 Atherosclerotic heart disease of native coronary artery without angina pectoris: Secondary | ICD-10-CM

## 2020-02-09 DIAGNOSIS — I739 Peripheral vascular disease, unspecified: Secondary | ICD-10-CM | POA: Diagnosis not present

## 2020-02-09 DIAGNOSIS — R252 Cramp and spasm: Secondary | ICD-10-CM | POA: Diagnosis not present

## 2020-02-09 NOTE — Progress Notes (Signed)
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 He had prior nuclear stress test in 2016 that was normal. Coronary CT 03/2017 showed 59-69% stenosis of the mid LAD, 25-50% mid LAD, 25-50% prox LAD, 0-25% distal RCA, 25-50% D1, 0-25% mid Cx. Risk factor modification was recommended. He saw Gerrianne Scale PA-C in follow-up 01/2018 and reported palpitations in the setting of caffeine. Reduction in caffeine was suggested with plan for event monitor if this persisted. Our office was contacted for surgical clearance for lumbar surgery which appointment was recommended, but the patient has since elected to defer the surgery until the summer. He still wished to keep the appointment. Last labs 02/2018 showed LDL 49, LFTs wnl, 02/2017 BMET showed Cr 0.95, K 4.8 in Epic. [facility] labs from 12/2017 showed K 4.1, Cr 1.050.  11/21/2018 -he was doing well, he had an episode of chest pressure and because he was being scheduled for surgery he underwent stress testing that showed completely normal LVEF and no evidence for prior infarct or ischemia.    02/09/2020 -he is coming after a year, eventually he did not undergo back surgery, he continues to have pain and cramping in his lower extremities, this can happen at rest and on exertion.  Denies any chest pain or shortness of breath.  No recent palpitation dizziness or syncope.  The pain in his legs is significant and he is not sure if this can be related to his spinal problems, peripheral arterial disease or his medications.  Past Medical History:  Diagnosis Date  . Back pain   . BPH (benign prostatic hyperplasia)   . CAD  (coronary artery disease)    a. mild-mod by cardiac CT 03/2017.  Marland Kitchen Chest pain   . Hyperlipidemia   . Hypertension   . OSA (obstructive sleep apnea)   . Pre-diabetes   . Spondylolisthesis    Past Surgical History:  Procedure Laterality Date  . MOUTH SURGERY  1982   3rd molers   . TONSILLECTOMY AND ADENOIDECTOMY  1959   Current Medications: Current Meds  Medication Sig  . alfuzosin (UROXATRAL) 10 MG 24 hr tablet Take 10 mg by mouth daily with breakfast.  . aspirin 81 MG tablet Take 81 mg by mouth daily.  Marland Kitchen atorvastatin (LIPITOR) 40 MG tablet Take 40 mg by mouth daily.  . B Complex-C (SUPER B-COMPLEX + VITAMIN C) TABS daily.   . cholecalciferol (VITAMIN D) 1000 UNITS tablet Take 2,000 Units by mouth daily.   . Coenzyme Q10 (COQ-10 PO) Take 100 mg by mouth daily.  Marland Kitchen ezetimibe (ZETIA) 10 MG tablet TAKE 1 TABLET DAILY  . finasteride (PROSCAR) 5 MG tablet Take 5 mg by mouth daily.  Marland Kitchen losartan-hydrochlorothiazide (HYZAAR) 50-12.5 MG tablet Take 1 tablet by mouth daily.  . Magnesium 250 MG TABS daily.   . sildenafil (VIAGRA) 50 MG tablet as needed.   . TURMERIC PO Take 1,000 mg by mouth daily.  . vitamin B-12 (CYANOCOBALAMIN) 1000 MCG tablet daily.   Allergies:   Oxytetracycline and Sulfa antibiotics   Social History   Socioeconomic History  . Marital status: Married    Spouse name:  Not on file  . Number of children: Not on file  . Years of education: Not on file  . Highest education level: Not on file  Occupational History  . Not on file  Tobacco Use  . Smoking status: Never Smoker  . Smokeless tobacco: Never Used  Substance and Sexual Activity  . Alcohol use: No  . Drug use: No  . Sexual activity: Not on file  Other Topics Concern  . Not on file  Social History Narrative  . Not on file   Social Determinants of Health   Financial Resource Strain:   . Difficulty of Paying Living Expenses:   Food Insecurity:   . Worried About Programme researcher, broadcasting/film/video in the Last Year:   .  Barista in the Last Year:   Transportation Needs:   . Freight forwarder (Medical):   Marland Kitchen Lack of Transportation (Non-Medical):   Physical Activity:   . Days of Exercise per Week:   . Minutes of Exercise per Session:   Stress:   . Feeling of Stress :   Social Connections:   . Frequency of Communication with Friends and Family:   . Frequency of Social Gatherings with Friends and Family:   . Attends Religious Services:   . Active Member of Clubs or Organizations:   . Attends Banker Meetings:   Marland Kitchen Marital Status:      Family History:  The patient's family history includes Breast cancer in his mother; COPD in his father and mother; Coronary artery disease in his mother; Heart Problems in his mother; Heart disease in his mother; Other in his brother.  ROS:   Please see the history of present illness. No syncope. All other systems are reviewed and otherwise negative.    PHYSICAL EXAM:   VS:  BP 126/80   Pulse 65   Ht 5\' 10"  (1.778 m)   Wt 195 lb 3.2 oz (88.5 kg)   SpO2 97%   BMI 28.01 kg/m   BMI: Body mass index is 28.01 kg/m. GEN: Well nourished, well developed WM, in no acute distress HEENT: normocephalic, atraumatic Neck: no JVD, carotid bruits, or masses Cardiac: RRR; no murmurs, rubs, or gallops, no edema, excellent pedal pulses bilaterally Respiratory:  clear to auscultation bilaterally, normal work of breathing GI: soft, nontender, nondistended, + BS MS: no deformity or atrophy Skin: warm and dry, no rash Neuro:  Alert and Oriented x 3, Strength and sensation are intact, follows commands Psych: euthymic mood, full affect  Wt Readings from Last 3 Encounters:  02/09/20 195 lb 3.2 oz (88.5 kg)  12/16/18 212 lb (96.2 kg)  11/21/18 212 lb 12.8 oz (96.5 kg)     Studies/Labs Reviewed:   EKG:  EKG was ordered today and personally reviewed by me and demonstrates sinus bradycardia 56bpm otherwise normal EKG.  Recent Labs: No results found for  requested labs within last 8760 hours.   Lipid Panel    Component Value Date/Time   CHOL 120 02/24/2018 0823   TRIG 46 02/24/2018 0823   HDL 62 02/24/2018 0823   CHOLHDL 1.9 02/24/2018 0823   LDLCALC 49 02/24/2018 0823   Exercise nuclear stress test 12/16/2018   The left ventricular ejection fraction is normal (55-65%).  Nuclear stress EF: 63%.  Blood pressure demonstrated a normal response to exercise.  There was no ST segment deviation noted during stress.  The study is normal.  This is a low risk study.   Normal stress nuclear  study with no ischemia or infarction.  Gated ejection fraction 63% with normal wall motion.  Doing well you get your Covid vaccine very good also  Additional studies/ records that were reviewed today include: Summarized above.  ASSESSMENT & PLAN:   1. Moderate nonobstructive CAD -negative stress test in 2020, no further episodes of chest pain, we need to continue aggressive medical management for his cholesterol, this is explained below.  Continue aspirin.  His EKG today is unchanged shows sinus bradycardia otherwise normal and unchanged from prior. 2. Essential HTN - controlled. Regular labs followed by PCP. 3. Hyperlipidemia -his cholesterol is at goal with LDL at 66 HDL 55 and triglycerides 61, however I am little concerned that he is muscle cramping can be related to Lipitor, the patient is advised to hold it for a week and if he sees improvement in his symptoms then will switch for nonstatin agent, if no improvement then restarted. 4. Muscle cramping claudications -as above, will also order bilateral lower extremity ultrasound however he has good pulses B/L. 5. Palpitations - extremely infrequent, not bothersome. Baseline sinus bradycardia precludes empiric treatment nor do his symptoms currently warrant this. Discussed Kardia/apple band as a way to monitor.  Disposition: F/u with Dr. Delton See in 6 months.  Medication Adjustments/Labs and Tests  Ordered: Current medicines are reviewed at length with the patient today.  Concerns regarding medicines are outlined above. Medication changes, Labs and Tests ordered today are summarized above and listed in the Patient Instructions accessible in Encounters.   Signed, Tobias Alexander, MD  02/09/2020 4:41 PM    [facility] [address] [place], [place], [place]  [address] Phone: [phone]; Fax: [phone]

## 2020-02-09 NOTE — Patient Instructions (Signed)
Medication Instructions:   HOLD YOUR ATORVASTATIN FOR 7-10 DAYS, THEN CALL us BACK AT [phone] OR SEND [place] A MYCHART MESSAGE, TO REPORT IF YOUR SYMPTOMS IMPROVED OR NOT.   *If you need a refill on your cardiac medications before your next appointment, please call your pharmacy*   Testing/Procedures:  Your physician has requested that you have a lower extremity arterial duplex. This test is an ultrasound of the arteries in the legs or arms. It looks at arterial blood flow in the legs and arms. Allow one hour for Lower and Upper Arterial scans. There are no restrictions or special instructions   Follow-Up: At [facility], you and your health needs are our priority.  As part of our continuing mission to provide you with exceptional heart care, we have created designated Provider Care Teams.  These Care Teams include your primary Cardiologist (physician) and Advanced Practice Providers (APPs -  Physician Assistants and Nurse Practitioners) who all work together to provide you with the care you need, when you need it.  We recommend signing up for the patient portal called "MyChart".  Sign up information is provided on this After Visit Summary.  MyChart is used to connect with patients for Virtual Visits (Telemedicine).  Patients are able to view lab/test results, encounter notes, upcoming appointments, etc.  Non-urgent messages can be sent to your provider as well.   To learn more about what you can do with MyChart, go to [email or web address].    Your next appointment:   6 month(s)  The format for your next appointment:   In Person  Provider:   Tobias Alexander, MD

## 2020-02-11 DIAGNOSIS — M4316 Spondylolisthesis, lumbar region: Secondary | ICD-10-CM | POA: Diagnosis not present

## 2020-02-19 ENCOUNTER — Other Ambulatory Visit: Payer: Self-pay | Admitting: Cardiology

## 2020-02-19 DIAGNOSIS — R252 Cramp and spasm: Secondary | ICD-10-CM

## 2020-02-19 DIAGNOSIS — I739 Peripheral vascular disease, unspecified: Secondary | ICD-10-CM

## 2020-02-22 ENCOUNTER — Ambulatory Visit (HOSPITAL_COMMUNITY)
Admission: RE | Admit: 2020-02-22 | Discharge: 2020-02-22 | Disposition: A | Discharge status: Home / Self Care | Payer: Medicare Other | Source: Ambulatory Visit | Attending: Cardiovascular Disease | Admitting: Cardiovascular Disease

## 2020-02-22 ENCOUNTER — Other Ambulatory Visit: Payer: Self-pay

## 2020-02-22 DIAGNOSIS — R252 Cramp and spasm: Secondary | ICD-10-CM

## 2020-02-22 DIAGNOSIS — I739 Peripheral vascular disease, unspecified: Secondary | ICD-10-CM

## 2020-02-24 ENCOUNTER — Other Ambulatory Visit: Payer: Self-pay | Admitting: Physician Assistant

## 2020-06-06 ENCOUNTER — Telehealth: Payer: Self-pay | Admitting: Cardiology

## 2020-06-06 NOTE — Telephone Encounter (Signed)
Called patient about his message. Patient stated last night when he was just sitting he had a dull left chest pain, and when he pressed on the area it hurt. Patient does not have any chest pain right now, and he has not had any chest pain with activity. Patient's last stress test was good. Made patient an appointment in September to follow up. Patient does not have any nitro to take, but he also has sildenafil on his list. Patient is very active and just wants to make sure that he is not having anything serious going on and over look it. Since Dr. Delton See is out of the office and Ronie Spies PA has seen patient in the past, will send her a message for further advisement.

## 2020-06-06 NOTE — Telephone Encounter (Signed)
  Patient sent the following message to the scheduling pool via MyChart but there are no close appointments available.  Hi, This is for a follow-up visit as per notice dated 05/31/2020. However... I have experienced some transient chest discomfort, most recently in the left pectoral area. Mild tenderness to palpation (pain level ~3). Recall no strenuous activity as a possible cause. No pain as I write this. Ascending stairs quickly, I get a little winded but that quickly subsides. (My exercise routine has dropped off .) I don't want to react to every little ache and pain but good to follow up.  Thanks, Todd Kane

## 2020-06-07 NOTE — Telephone Encounter (Signed)
Chest pain with palpation is rarely heart related but best way to eval would be with an OV for reassurance since it's just not possible to comprehensibly evaluate this symptom over the phone. Please review warning sx with patient in meantime. Matteus Mcnelly PA-C

## 2020-06-07 NOTE — Telephone Encounter (Signed)
Left message for patient to call back  

## 2020-06-10 NOTE — Telephone Encounter (Signed)
Ethelda Chick, RN 4 days ago     Called patient about his message. Patient stated last night when he was just sitting he had a dull left chest pain, and when he pressed on the area it hurt. Patient does not have any chest pain right now, and he has not had any chest pain with activity. Patient's last stress test was good. Made patient an appointment in September to follow up. Patient does not have any nitro to take, but he also has sildenafil on his list. Patient is very active and just wants to make sure that he is not having anything serious going on and over look it. Since Dr. Delton See is out of the office and Ronie Spies PA has seen patient in the past, will send her a message for further advisement.      Called patient to follow up after DD, PA suggested the following:     Chest pain with palpation is rarely heart related but best way to eval would be with an OV for reassurance since it's just not possible to comprehensibly evaluate this symptom over the phone. Please review warning sx with patient in meantime. Dayna Dunn PA-C   Pt states that he is feeling much better and has not experiencing any of the above symptoms since originally calling in. Pt states that he does not feel he needs to come into the office for an appointment right away and prefers to keep his 9/1 OV with Leda Gauze. Pt is aware of signs and symptoms that warrant a call back to [place] versus a call to EMS. He verbalized understanding.

## 2020-06-22 NOTE — Telephone Encounter (Signed)
Spoke to pt. [name], [place] recommendations and he was agreeable. Advised him to go to the ED with prolonged CP. He was very appreciative for the call.

## 2020-07-04 NOTE — Progress Notes (Signed)
Cardiology Office Note    Date:  07/06/2020   ID:  Todd Kane, DOB [birth year], MRN [id number]  PCP:  Farris Has, MD  Cardiologist: Tobias Alexander, MD EPS: None  Chief Complaint  Patient presents with  . Follow-up    History of Present Illness:  Todd Kane is a 64 y.o. 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 Coronary CT 03/2017 showed 59-69% stenosis of the mid LAD, 25-50% mid LAD, 25-50% prox LAD, 0-25% distal RCA, 25-50% D1, 0-25% mid Cx. Risk factor modification was recommended.  Patient last saw Dr. Delton See in the office 02/09/2020 being muscle cramping that she thought may be related to Lipitor.  He was advised to hold it for a week and see if it improved his symptoms if no improvement restart.  Patient comes in for f/u. When he goes up the stairs he has a fluttering in his chest. Maybe some associated shortness of breath but no chest pain. HR usually in upper 50's, goes up to 100 when he has his symptoms. Not exercising as much as he used to. Back issues so can't walk too much. Has a bike/elliptical at home. He does play golf.    Past Medical History:  Diagnosis Date  . Back pain   . BPH (benign prostatic hyperplasia)   . CAD (coronary artery disease)    a. mild-mod by cardiac CT 03/2017.  Marland Kitchen Chest pain   . Hyperlipidemia   . Hypertension   . OSA (obstructive sleep apnea)   . Pre-diabetes   . Spondylolisthesis     Past Surgical History:  Procedure Laterality Date  . MOUTH SURGERY  1982   3rd molers   . TONSILLECTOMY AND ADENOIDECTOMY  1959    Current Medications: Current Meds  Medication Sig  . alfuzosin (UROXATRAL) 10 MG 24 hr tablet Take 10 mg by mouth daily with breakfast.  . aspirin 81 MG tablet Take 81 mg by mouth daily.  Marland Kitchen atorvastatin (LIPITOR) 40 MG tablet Take 40 mg by mouth daily.  . B Complex-C (SUPER B-COMPLEX + VITAMIN  C) TABS daily.   . cholecalciferol (VITAMIN D) 1000 UNITS tablet Take 2,000 Units by mouth daily.   . Coenzyme Q10 (COQ-10 PO) Take 100 mg by mouth daily.  Marland Kitchen ezetimibe (ZETIA) 10 MG tablet TAKE 1 TABLET DAILY  . finasteride (PROSCAR) 5 MG tablet Take 5 mg by mouth daily.  Marland Kitchen losartan-hydrochlorothiazide (HYZAAR) 50-12.5 MG tablet Take 1 tablet by mouth daily.  . Magnesium 250 MG TABS daily.   . sildenafil (VIAGRA) 50 MG tablet as needed.   . TURMERIC PO Take 1,000 mg by mouth daily.  . vitamin B-12 (CYANOCOBALAMIN) 1000 MCG tablet daily.      Allergies:   Oxytetracycline and Sulfa antibiotics   Social History   Socioeconomic History  . Marital status: Married    Spouse name: Not on file  . Number of children: Not on file  . Years of education: Not on file  . Highest education level: Not on file  Occupational History  . Not on file  Tobacco Use  . Smoking status: Never Smoker  . Smokeless tobacco: Never Used  Vaping Use  . Vaping Use: Never used  Substance and Sexual Activity  . Alcohol use: No  . Drug use: No  . Sexual activity: Not on file  Other Topics Concern  . Not on file  Social History  Narrative  . Not on file   Social Determinants of Health   Financial Resource Strain:   . Difficulty of Paying Living Expenses: Not on file  Food Insecurity:   . Worried About Programme researcher, broadcasting/film/videounning Out of Food in the Last Year: Not on file  . Ran Out of Food in the Last Year: Not on file  Transportation Needs:   . Lack of Transportation (Medical): Not on file  . Lack of Transportation (Non-Medical): Not on file  Physical Activity:   . Days of Exercise per Week: Not on file  . Minutes of Exercise per Session: Not on file  Stress:   . Feeling of Stress : Not on file  Social Connections:   . Frequency of Communication with Friends and Family: Not on file  . Frequency of Social Gatherings with Friends and Family: Not on file  . Attends Religious Services: Not on file  . Active Member of  Clubs or Organizations: Not on file  . Attends BankerClub or Organization Meetings: Not on file  . Marital Status: Not on file     Family History:  The patient's family history includes Breast cancer in his mother; COPD in his father and mother; Coronary artery disease in his mother; Heart Problems in his mother; Heart disease in his mother; Other in his brother.   ROS:   Please see the history of present illness.    ROS All other systems reviewed and are negative.   PHYSICAL EXAM:   VS:  BP 110/80   Pulse 65   Ht 5\' 10"  (1.778 m)   Wt 203 lb 12.8 oz (92.4 kg)   SpO2 97%   BMI 29.24 kg/m   Physical Exam  GEN: Well nourished, well developed, in no acute distress  Neck: no JVD, carotid bruits, or masses Cardiac:RRR; no murmurs, rubs, or gallops  Respiratory:  clear to auscultation bilaterally, normal work of breathing GI: soft, nontender, nondistended, + BS Ext: without cyanosis, clubbing, or edema, Good distal pulses bilaterally Neuro:  Alert and Oriented x 3 Psych: euthymic mood, full affect  Wt Readings from Last 3 Encounters:  07/06/20 203 lb 12.8 oz (92.4 kg)  02/09/20 195 lb 3.2 oz (88.5 kg)  12/16/18 212 lb (96.2 kg)      Studies/Labs Reviewed:   EKG:  EKG is not ordered today.    Recent Labs: No results found for requested labs within last 8760 hours.   Lipid Panel    Component Value Date/Time   CHOL 120 02/24/2018 0823   TRIG 46 02/24/2018 0823   HDL 62 02/24/2018 0823   CHOLHDL 1.9 02/24/2018 0823   LDLCALC 49 02/24/2018 0823    Additional studies/ records that were reviewed today include:    Exercise nuclear stress test 12/16/2018    The left ventricular ejection fraction is normal (55-65%).  Nuclear stress EF: 63%.  Blood pressure demonstrated a normal response to exercise.  There was no ST segment deviation noted during stress.  The study is normal.  This is a low risk study.   Normal stress nuclear study with no ischemia or infarction.   Gated ejection fraction 63% with normal wall motion.  Doing well you get your Covid vaccine very good also     ASSESSMENT:    1. Coronary artery disease involving native coronary artery of native heart with angina pectoris (HCC)   2. Hyperlipidemia, unspecified hyperlipidemia type   3. Essential hypertension   4. Palpitations   5. Screening for diabetes mellitus  PLAN:  In order of problems listed above:   Moderate nonobstructive CAD on coronary CTA 2018 -normal stress test 2020.  No angina.  Hyperlipidemia LDL 59 02/04/2020.  Patient is requesting repeat today.  On Lipitor and Zetia  Essential hypertension well-controlled on Hyzaar  Palpitations related to caffeine intake in the past.  Patient does drink 3 to 4 cups of coffee daily but says it is half-and-half.  Will place 2-week monitor to rule out arrhythmia.  Also check surveillance labs including magnesium.   Medication Adjustments/Labs and Tests Ordered: Current medicines are reviewed at length with the patient today.  Concerns regarding medicines are outlined above.  Medication changes, Labs and Tests ordered today are listed in the Patient Instructions below. Patient Instructions  Medication Instructions:  Your physician recommends that you continue on your current medications as directed. Please refer to the Current Medication list given to you today.  *If you need a refill on your cardiac medications before your next appointment, please call your pharmacy*   Lab Work: TODAY:  LIPID, CMET, CBC, MAG, & A1C  If you have labs (blood work) drawn today and your tests are completely normal, you will receive your results only by: Marland Kitchen MyChart Message (if you have MyChart) OR . A paper copy in the mail If you have any lab test that is abnormal or we need to change your treatment, we will call you to review the results.   Testing/Procedures: Christena Deem- Long Term Monitor Instructions   Your physician has requested you wear  your ZIO patch monitor 14 days.   This is a single patch monitor.  [employer] supplies one patch monitor per enrollment.  Additional stickers are not available.   Please do not apply patch if you will be having a Nuclear Stress Test, Echocardiogram, Cardiac CT, MRI, or Chest Xray during the time frame you would be wearing the monitor. The patch cannot be worn during these tests.  You cannot remove and re-apply the ZIO XT patch monitor.   Your ZIO patch monitor will be sent USPS Priority mail from [facility] directly to your home address. The monitor may also be mailed to a PO BOX if home delivery is not available.   It may take 3-5 days to receive your monitor after you have been enrolled.   Once you have received you monitor, please review enclosed instructions.  Your monitor has already been registered assigning a specific monitor serial # to you.   Applying the monitor   Shave hair from upper left chest.   Hold abrader disc by orange tab.  Rub abrader in 40 strokes over left upper chest as indicated in your monitor instructions.   Clean area with 4 enclosed alcohol pads .  Use all pads to assure are is cleaned thoroughly.  Let dry.   Apply patch as indicated in monitor instructions.  Patch will be place under collarbone on left side of chest with arrow pointing upward.   Rub patch adhesive wings for 2 minutes.Remove white label marked "1".  Remove white label marked "2".  Rub patch adhesive wings for 2 additional minutes.   While looking in a mirror, press and release button in center of patch.  A small green light will flash 3-4 times .  This will be your only indicator the monitor has been turned on.     Do not shower for the first 24 hours.  You may shower after the first 24 hours.   Press button if  you feel a symptom. You will hear a small click.  Record Date, Time and Symptom in the Patient Log Book.   When you are ready to remove patch, follow instructions on last 2 pages  of Patient Log Book.  Stick patch monitor onto last page of Patient Log Book.   Place Patient Log Book in [place] box.  Use locking tab on box and tape box closed securely.  The Orange and [employer] has [employer] on it.  Please place in mailbox as soon as possible.  Your physician should have your test results approximately 7 days after the monitor has been mailed back to [facility].   Call [facility] Customer Care at [phone] if you have questions regarding your ZIO XT patch monitor.  Call them immediately if you see an orange light blinking on your monitor.   If your monitor falls off in less than 4 days contact our Monitor department at [phone].  If your monitor becomes loose or falls off after 4 days call [employer] at [phone] for suggestions on securing your monitor.     Follow-Up: At [facility], you and your health needs are our priority.  As part of our continuing mission to provide you with exceptional heart care, we have created designated Provider Care Teams.  These Care Teams include your primary Cardiologist (physician) and Advanced Practice Providers (APPs -  Physician Assistants and Nurse Practitioners) who all work together to provide you with the care you need, when you need it.  We recommend signing up for the patient portal called "MyChart".  Sign up information is provided on this After Visit Summary.  MyChart is used to connect with patients for Virtual Visits (Telemedicine).  Patients are able to view lab/test results, encounter notes, upcoming appointments, etc.  Non-urgent messages can be sent to your provider as well.   To learn more about what you can do with MyChart, go to [email or web address].    Your next appointment:    Keep your scheduled appointment with Dr. Delton See  The format for your next appointment:   In Person  Provider:   Tobias Alexander, MD   Other Instructions Continue to exercise, at least 150 minutes a week.      Elson Clan, PA-C  07/06/2020 8:44 AM    [facility] [address] [place], [place], [place]  [address] Phone: [phone]; Fax: [phone]

## 2020-07-06 ENCOUNTER — Other Ambulatory Visit: Payer: Self-pay

## 2020-07-06 ENCOUNTER — Ambulatory Visit (INDEPENDENT_AMBULATORY_CARE_PROVIDER_SITE_OTHER): Payer: Medicare Other | Admitting: Physician Assistant

## 2020-07-06 ENCOUNTER — Encounter: Payer: Self-pay | Admitting: *Deleted

## 2020-07-06 ENCOUNTER — Encounter: Payer: Self-pay | Admitting: Physician Assistant

## 2020-07-06 VITALS — BP 110/80 | HR 65 | Ht 70.0 in | Wt 203.8 lb

## 2020-07-06 DIAGNOSIS — R002 Palpitations: Secondary | ICD-10-CM

## 2020-07-06 DIAGNOSIS — Z131 Encounter for screening for diabetes mellitus: Secondary | ICD-10-CM

## 2020-07-06 DIAGNOSIS — E785 Hyperlipidemia, unspecified: Secondary | ICD-10-CM

## 2020-07-06 DIAGNOSIS — I25119 Atherosclerotic heart disease of native coronary artery with unspecified angina pectoris: Secondary | ICD-10-CM | POA: Diagnosis not present

## 2020-07-06 DIAGNOSIS — R7303 Prediabetes: Secondary | ICD-10-CM | POA: Diagnosis not present

## 2020-07-06 DIAGNOSIS — I10 Essential (primary) hypertension: Secondary | ICD-10-CM

## 2020-07-06 NOTE — Progress Notes (Signed)
Patient ID: Todd Kane, male   DOB: 06-Apr-1956, 64 y.o.   MRN: [id number] Patient enrolled for [employer] to ship a 14 day ZIO XT long term holter monitor to his home.

## 2020-07-06 NOTE — Patient Instructions (Signed)
Medication Instructions:  Your physician recommends that you continue on your current medications as directed. Please refer to the Current Medication list given to you today.  *If you need a refill on your cardiac medications before your next appointment, please call your pharmacy*   Lab Work: TODAY:  LIPID, CMET, CBC, MAG, & A1C  If you have labs (blood work) drawn today and your tests are completely normal, you will receive your results only by: Marland Kitchen MyChart Message (if you have MyChart) OR . A paper copy in the mail If you have any lab test that is abnormal or we need to change your treatment, we will call you to review the results.   Testing/Procedures: Christena Deem- Long Term Monitor Instructions   Your physician has requested you wear your ZIO patch monitor 14 days.   This is a single patch monitor.  [employer] supplies one patch monitor per enrollment.  Additional stickers are not available.   Please do not apply patch if you will be having a Nuclear Stress Test, Echocardiogram, Cardiac CT, MRI, or Chest Xray during the time frame you would be wearing the monitor. The patch cannot be worn during these tests.  You cannot remove and re-apply the ZIO XT patch monitor.   Your ZIO patch monitor will be sent USPS Priority mail from [facility] directly to your home address. The monitor may also be mailed to a PO BOX if home delivery is not available.   It may take 3-5 days to receive your monitor after you have been enrolled.   Once you have received you monitor, please review enclosed instructions.  Your monitor has already been registered assigning a specific monitor serial # to you.   Applying the monitor   Shave hair from upper left chest.   Hold abrader disc by orange tab.  Rub abrader in 40 strokes over left upper chest as indicated in your monitor instructions.   Clean area with 4 enclosed alcohol pads .  Use all pads to assure are is cleaned thoroughly.  Let dry.   Apply  patch as indicated in monitor instructions.  Patch will be place under collarbone on left side of chest with arrow pointing upward.   Rub patch adhesive wings for 2 minutes.Remove white label marked "1".  Remove white label marked "2".  Rub patch adhesive wings for 2 additional minutes.   While looking in a mirror, press and release button in center of patch.  A small green light will flash 3-4 times .  This will be your only indicator the monitor has been turned on.     Do not shower for the first 24 hours.  You may shower after the first 24 hours.   Press button if you feel a symptom. You will hear a small click.  Record Date, Time and Symptom in the Patient Log Book.   When you are ready to remove patch, follow instructions on last 2 pages of Patient Log Book.  Stick patch monitor onto last page of Patient Log Book.   Place Patient Log Book in [place] box.  Use locking tab on box and tape box closed securely.  The [employer] and [employer] has [employer] on it.  Please place in mailbox as soon as possible.  Your physician should have your test results approximately 7 days after the monitor has been mailed back to [facility].   Call [facility] Customer Care at [phone] if you have questions regarding your ZIO XT patch  monitor.  Call them immediately if you see an orange light blinking on your monitor.   If your monitor falls off in less than 4 days contact our Monitor department at [phone].  If your monitor becomes loose or falls off after 4 days call [employer] at [phone] for suggestions on securing your monitor.     Follow-Up: At [facility], you and your health needs are our priority.  As part of our continuing mission to provide you with exceptional heart care, we have created designated Provider Care Teams.  These Care Teams include your primary Cardiologist (physician) and Advanced Practice Providers (APPs -  Physician Assistants and Nurse Practitioners) who all  work together to provide you with the care you need, when you need it.  We recommend signing up for the patient portal called "MyChart".  Sign up information is provided on this After Visit Summary.  MyChart is used to connect with patients for Virtual Visits (Telemedicine).  Patients are able to view lab/test results, encounter notes, upcoming appointments, etc.  Non-urgent messages can be sent to your provider as well.   To learn more about what you can do with MyChart, go to [email or web address].    Your next appointment:    Keep your scheduled appointment with Dr. Delton See  The format for your next appointment:   In Person  Provider:   Tobias Alexander, MD   Other Instructions Continue to exercise, at least 150 minutes a week.

## 2020-07-07 LAB — COMPREHENSIVE METABOLIC PANEL
ALT: 23 IU/L (ref 0–44)
AST: 27 IU/L (ref 0–40)
Albumin/Globulin Ratio: 1.8 (ref 1.2–2.2)
Albumin: 4.1 g/dL (ref 3.8–4.8)
Alkaline Phosphatase: 58 IU/L (ref 48–121)
BUN/Creatinine Ratio: 17 (ref 10–24)
BUN: 17 mg/dL (ref 8–27)
Bilirubin Total: 1.1 mg/dL (ref 0.0–1.2)
CO2: 25 mmol/L (ref 20–29)
Calcium: 9.4 mg/dL (ref 8.6–10.2)
Chloride: 102 mmol/L (ref 96–106)
Creatinine, Ser: 1.01 mg/dL (ref 0.76–1.27)
GFR calc Af Amer: 89 mL/min/{1.73_m2} (ref >59)
GFR calc non Af Amer: 77 mL/min/{1.73_m2} (ref >59)
Globulin, Total: 2.3 g/dL (ref 1.5–4.5)
Glucose: 103 mg/dL — ABNORMAL HIGH (ref 65–99)
Potassium: 3.6 mmol/L (ref 3.5–5.2)
Sodium: 140 mmol/L (ref 134–144)
Total Protein: 6.4 g/dL (ref 6.0–8.5)

## 2020-07-07 LAB — CBC
Hematocrit: 44.9 % (ref 37.5–51.0)
Hemoglobin: 14.8 g/dL (ref 13.0–17.7)
MCH: 30.3 pg (ref 26.6–33.0)
MCHC: 33 g/dL (ref 31.5–35.7)
MCV: 92 fL (ref 79–97)
Platelets: 176 10*3/uL (ref 150–450)
RBC: 4.89 x10E6/uL (ref 4.14–5.80)
RDW: 12.9 % (ref 11.6–15.4)
WBC: 5.1 10*3/uL (ref 3.4–10.8)

## 2020-07-07 LAB — LIPID PANEL
Chol/HDL Ratio: 2.1 ratio (ref 0.0–5.0)
Cholesterol, Total: 135 mg/dL (ref 100–199)
HDL: 63 mg/dL (ref >39)
LDL Chol Calc (NIH): 58 mg/dL (ref 0–99)
Triglycerides: 67 mg/dL (ref 0–149)
VLDL Cholesterol Cal: 14 mg/dL (ref 5–40)

## 2020-07-07 LAB — HEMOGLOBIN A1C
Est. average glucose Bld gHb Est-mCnc: 128 mg/dL
Hgb A1c MFr Bld: 6.1 % — ABNORMAL HIGH (ref 4.8–5.6)

## 2020-07-07 LAB — MAGNESIUM: Magnesium: 2 mg/dL (ref 1.6–2.3)

## 2020-08-01 ENCOUNTER — Ambulatory Visit (INDEPENDENT_AMBULATORY_CARE_PROVIDER_SITE_OTHER): Payer: Medicare Other

## 2020-08-01 DIAGNOSIS — R002 Palpitations: Secondary | ICD-10-CM

## 2020-08-01 DIAGNOSIS — E785 Hyperlipidemia, unspecified: Secondary | ICD-10-CM

## 2020-08-01 DIAGNOSIS — I25119 Atherosclerotic heart disease of native coronary artery with unspecified angina pectoris: Secondary | ICD-10-CM

## 2020-08-01 DIAGNOSIS — I10 Essential (primary) hypertension: Secondary | ICD-10-CM

## 2020-08-03 DIAGNOSIS — R7303 Prediabetes: Secondary | ICD-10-CM | POA: Insufficient documentation

## 2020-08-11 DIAGNOSIS — M4316 Spondylolisthesis, lumbar region: Secondary | ICD-10-CM | POA: Diagnosis not present

## 2020-08-24 DIAGNOSIS — R002 Palpitations: Secondary | ICD-10-CM | POA: Diagnosis not present

## 2020-08-30 ENCOUNTER — Telehealth: Payer: Self-pay | Admitting: *Deleted

## 2020-08-30 DIAGNOSIS — I10 Essential (primary) hypertension: Secondary | ICD-10-CM

## 2020-08-30 DIAGNOSIS — R9431 Abnormal electrocardiogram [ECG] [EKG]: Secondary | ICD-10-CM

## 2020-08-30 DIAGNOSIS — I472 Ventricular tachycardia, unspecified: Secondary | ICD-10-CM

## 2020-08-30 DIAGNOSIS — I471 Supraventricular tachycardia, unspecified: Secondary | ICD-10-CM

## 2020-08-30 DIAGNOSIS — I25119 Atherosclerotic heart disease of native coronary artery with unspecified angina pectoris: Secondary | ICD-10-CM

## 2020-08-30 DIAGNOSIS — R002 Palpitations: Secondary | ICD-10-CM

## 2020-08-30 NOTE — Telephone Encounter (Signed)
Pt made aware of monitor results and recommendations per Dr. Delton See, for him to have an echo done to further evaluate these findings.  Informed the pt that I will go ahead and place the order for the echo in the system, and send a message to our Echo Scheduler to call him back and arrange this appt.  Endorsed to the pt that we will see him as planned in clinic for 09/15/20.  Pt verbalized understanding and agrees with this plan.

## 2020-08-30 NOTE — Telephone Encounter (Signed)
-----   Message from Lars Masson, MD sent at 08/25/2020 11:52 AM EDT ----- Predominant underlying rhythm was Sinus Rhythm.  1 run of Ventricular Tachycardia occurred lasting 4 beats. 24 short runs of Supraventricular Tachycardia runs occurred, the longest lasting 9.3 secs. Frequent Ventricular Bigeminy and Trigeminy were present.  Patient's symptoms correlated with SVTs. Please obtain an echocardiogram.

## 2020-09-13 ENCOUNTER — Other Ambulatory Visit (HOSPITAL_COMMUNITY): Payer: Medicare Other

## 2020-09-14 ENCOUNTER — Other Ambulatory Visit (HOSPITAL_COMMUNITY): Payer: Medicare Other

## 2020-09-15 ENCOUNTER — Ambulatory Visit (HOSPITAL_COMMUNITY): Payer: Medicare Other | Attending: Internal Medicine

## 2020-09-15 ENCOUNTER — Ambulatory Visit (INDEPENDENT_AMBULATORY_CARE_PROVIDER_SITE_OTHER): Payer: Medicare Other | Admitting: Cardiology

## 2020-09-15 ENCOUNTER — Other Ambulatory Visit: Payer: Self-pay

## 2020-09-15 ENCOUNTER — Encounter: Payer: Self-pay | Admitting: Cardiology

## 2020-09-15 VITALS — BP 142/94 | HR 54 | Ht 70.0 in | Wt 212.4 lb

## 2020-09-15 DIAGNOSIS — I25119 Atherosclerotic heart disease of native coronary artery with unspecified angina pectoris: Secondary | ICD-10-CM | POA: Diagnosis not present

## 2020-09-15 DIAGNOSIS — G4733 Obstructive sleep apnea (adult) (pediatric): Secondary | ICD-10-CM | POA: Diagnosis not present

## 2020-09-15 DIAGNOSIS — E785 Hyperlipidemia, unspecified: Secondary | ICD-10-CM | POA: Diagnosis not present

## 2020-09-15 DIAGNOSIS — R9431 Abnormal electrocardiogram [ECG] [EKG]: Secondary | ICD-10-CM

## 2020-09-15 DIAGNOSIS — I472 Ventricular tachycardia, unspecified: Secondary | ICD-10-CM

## 2020-09-15 DIAGNOSIS — I471 Supraventricular tachycardia: Secondary | ICD-10-CM

## 2020-09-15 DIAGNOSIS — I10 Essential (primary) hypertension: Secondary | ICD-10-CM

## 2020-09-15 DIAGNOSIS — R002 Palpitations: Secondary | ICD-10-CM | POA: Diagnosis not present

## 2020-09-15 LAB — ECHOCARDIOGRAM COMPLETE
Area-P 1/2: 4.36 cm2
S' Lateral: 3.7 cm

## 2020-09-15 NOTE — Patient Instructions (Signed)
Medication Instructions:   Your physician recommends that you continue on your current medications as directed. Please refer to the Current Medication list given to you today.  *If you need a refill on your cardiac medications before your next appointment, please call your pharmacy*   Follow-Up:  4 MONTHS IN THE OFFICE WITH AN EXTENDER   

## 2020-09-15 NOTE — Progress Notes (Signed)
Cardiology Office Note    Date:  09/15/2020  ID:  Todd Kane, DOB [birth year], MRN [id number] PCP:  Farris Has, MD  Cardiologist:  Tobias Alexander, MD   Chief Complaint: 6 months follow-up  History of Present Illness:  Todd Kane is a 64 y.o. 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 He had prior nuclear stress test in 2016 that was normal. Coronary CT 03/2017 showed 59-69% stenosis of the mid LAD, 25-50% mid LAD, 25-50% prox LAD, 0-25% distal RCA, 25-50% D1, 0-25% mid Cx. Risk factor modification was recommended. He saw Leda Gauze PA-C in follow-up 01/2018 and reported palpitations in the setting of caffeine. Reduction in caffeine was suggested with plan for event monitor if this persisted. Our office was contacted for surgical clearance for lumbar surgery which appointment was recommended, but the patient has since elected to defer the surgery until the summer. He still wished to keep the appointment. Last labs 02/2018 showed LDL 49, LFTs wnl, 02/2017 BMET showed Cr 0.95, K 4.8 in Epic. [facility] labs from 12/2017 showed K 4.1, Cr 1.050.  The patient is coming after 6 months, he states that he stopped exercising gained weight and feels short of breath with activities, he occasionally gets chest tightness when he first start exercising but it goes away afterwards.  He continues to have palpitations that are lasting few seconds but no associated dizziness presyncope or syncope.  He has been compliant with his meds.  He admits that he was diagnosed with obstructive sleep apnea and started on CPAP several years ago but he has not been using it.  He is scheduled to obtain a new mask.  Past Medical History:  Diagnosis Date  . Back pain   . BPH (benign prostatic hyperplasia)   . CAD (coronary artery disease)    a. mild-mod by cardiac CT 03/2017.  Marland Kitchen Chest pain   . Hyperlipidemia   .  Hypertension   . OSA (obstructive sleep apnea)   . Pre-diabetes   . Spondylolisthesis    Past Surgical History:  Procedure Laterality Date  . MOUTH SURGERY  1982   3rd molers   . TONSILLECTOMY AND ADENOIDECTOMY  1959   Current Medications: Current Meds  Medication Sig  . alfuzosin (UROXATRAL) 10 MG 24 hr tablet Take 10 mg by mouth daily with breakfast.  . aspirin 81 MG tablet Take 81 mg by mouth daily.  Marland Kitchen atorvastatin (LIPITOR) 40 MG tablet Take 40 mg by mouth daily.  . B Complex-C (SUPER B-COMPLEX + VITAMIN C) TABS daily.   . cholecalciferol (VITAMIN D) 1000 UNITS tablet Take 2,000 Units by mouth daily.   . Coenzyme Q10 (COQ-10 PO) Take 100 mg by mouth daily.  Marland Kitchen ezetimibe (ZETIA) 10 MG tablet TAKE 1 TABLET DAILY  . finasteride (PROSCAR) 5 MG tablet Take 5 mg by mouth daily.  Marland Kitchen losartan-hydrochlorothiazide (HYZAAR) 50-12.5 MG tablet Take 1 tablet by mouth daily.  . Magnesium 250 MG TABS daily.   . sildenafil (VIAGRA) 50 MG tablet as needed.   . TURMERIC PO Take 1,000 mg by mouth daily.  . vitamin B-12 (CYANOCOBALAMIN) 1000 MCG tablet daily.   Allergies:   Oxytetracycline and Sulfa antibiotics   Social History   Socioeconomic History  . Marital status: Married    Spouse name: Not on file  . Number of children: Not on file  . Years of education: Not on file  .  Highest education level: Not on file  Occupational History  . Not on file  Tobacco Use  . Smoking status: Never Smoker  . Smokeless tobacco: Never Used  Vaping Use  . Vaping Use: Never used  Substance and Sexual Activity  . Alcohol use: No  . Drug use: No  . Sexual activity: Not on file  Other Topics Concern  . Not on file  Social History Narrative  . Not on file   Social Determinants of Health   Financial Resource Strain:   . Difficulty of Paying Living Expenses: Not on file  Food Insecurity:   . Worried About Programme researcher, broadcasting/film/video in the Last Year: Not on file  . Ran Out of Food in the Last Year: Not on  file  Transportation Needs:   . Lack of Transportation (Medical): Not on file  . Lack of Transportation (Non-Medical): Not on file  Physical Activity:   . Days of Exercise per Week: Not on file  . Minutes of Exercise per Session: Not on file  Stress:   . Feeling of Stress : Not on file  Social Connections:   . Frequency of Communication with Friends and Family: Not on file  . Frequency of Social Gatherings with Friends and Family: Not on file  . Attends Religious Services: Not on file  . Active Member of Clubs or Organizations: Not on file  . Attends Banker Meetings: Not on file  . Marital Status: Not on file     Family History:  The patient's family history includes Breast cancer in his mother; COPD in his father and mother; Coronary artery disease in his mother; Heart Problems in his mother; Heart disease in his mother; Other in his brother.  ROS:   Please see the history of present illness. No syncope. All other systems are reviewed and otherwise negative.    PHYSICAL EXAM:   VS:  BP (!) 142/94   Pulse (!) 54   Ht 5\' 10"  (1.778 m)   Wt 212 lb 6.4 oz (96.3 kg)   SpO2 96%   BMI 30.48 kg/m   BMI: Body mass index is 30.48 kg/m. GEN: Well nourished, well developed WM, in no acute distress HEENT: normocephalic, atraumatic Neck: no JVD, carotid bruits, or masses Cardiac: RRR; no murmurs, rubs, or gallops, no edema, excellent pedal pulses bilaterally Respiratory:  clear to auscultation bilaterally, normal work of breathing GI: soft, nontender, nondistended, + BS MS: no deformity or atrophy Skin: warm and dry, no rash Neuro:  Alert and Oriented x 3, Strength and sensation are intact, follows commands Psych: euthymic mood, full affect  Wt Readings from Last 3 Encounters:  09/15/20 212 lb 6.4 oz (96.3 kg)  07/06/20 203 lb 12.8 oz (92.4 kg)  02/09/20 195 lb 3.2 oz (88.5 kg)     Studies/Labs Reviewed:   EKG:  EKG was ordered today and personally reviewed by  me and demonstrates sinus bradycardia 56bpm otherwise normal EKG.  Recent Labs: 07/06/2020: ALT 23; BUN 17; Creatinine, Ser 1.01; Hemoglobin 14.8; Magnesium 2.0; Platelets 176; Potassium 3.6; Sodium 140   Lipid Panel    Component Value Date/Time   CHOL 135 07/06/2020 0850   TRIG 67 07/06/2020 0850   HDL 63 07/06/2020 0850   CHOLHDL 2.1 07/06/2020 0850   LDLCALC 58 07/06/2020 0850   Exercise nuclear stress test 12/16/2018   The left ventricular ejection fraction is normal (55-65%).  Nuclear stress EF: 63%.  Blood pressure demonstrated a normal response to  exercise.  There was no ST segment deviation noted during stress.  The study is normal.  This is a low risk study.   Normal stress nuclear study with no ischemia or infarction.  Gated ejection fraction 63% with normal wall motion.  Doing well you get your Covid vaccine very good also  Additional studies/ records that were reviewed today include: Summarized above.  ASSESSMENT & PLAN:   1. Moderate nonobstructive CAD -on coronary CTA in 2018, negative stress test in 2020, he is EKG today shows sinus bradycardia otherwise normal EKG and unchanged from prior.  He does have symptoms of worsening dyspnea on exertion, however possibly secondary to deconditioning, we went over his exercise plan starting 20 minutes of cardio 5 times a week and increasing in 5-minute increments every week, symptoms limiting, he is instructed to start doing that and if in several weeks feels like he cannot continue we might consider repeating his coronary CTA. 2. Essential HTN - controlled.  3. Hyperlipidemia -at goal, tolerating 40 mg of atorvastatin.   4. Palpitations -2-week Holter monitor showed very short episode of nonsustained ventricular tachycardia lasting 4 beats and 24 episodes of SVTs with longest lasting 9 seconds.  His echocardiogram today showed normal systolic and diastolic function and normal strain, no significant valvular abnormalities, if  his symptoms of dyspnea on exertion do not improve I will reconsider performing repeat coronary CTA, he is also strongly encouraged to start using his CPAP machine as he gets desaturations to 80s at night. 5. Obstructive sleep apnea -on CPAP machine.  Disposition: F/u with Dr. Delton See in 4 months.  Medication Adjustments/Labs and Tests Ordered: Current medicines are reviewed at length with the patient today.  Concerns regarding medicines are outlined above. Medication changes, Labs and Tests ordered today are summarized above and listed in the Patient Instructions accessible in Encounters.   Signed, Tobias Alexander, MD  09/15/2020 10:10 AM    [facility] [address] [place], [place], [place]  [address] Phone: [phone]; Fax: [phone]

## 2020-10-06 DIAGNOSIS — Z20822 Contact with and (suspected) exposure to covid-19: Secondary | ICD-10-CM | POA: Diagnosis not present

## 2021-01-04 NOTE — Progress Notes (Signed)
Cardiology Office Note    Date:  01/17/2021   ID:  Todd Royaltyhomas Levins, DOB 01/20/1956, MRN [id number]   PCP:  Farris HasMorrow, Aaron, MD   [facility]  Cardiologist:  Tobias AlexanderKatarina Nelson, MD Advanced Practice Provider:  Dyann KiefLenze, Michele M, PA-C Electrophysiologist:  None   [id number]}   Chief Complaint  Patient presents with  . Follow-up    History of Present Illness:  Todd Kane is a 65 y.o. male with history of hypertension, HLD, nonobstructive CAD coronary CT 03/2017-59 to 69% mid LAD, 25 to 50% mid LAD, 25 to 50% proximal LAD, 0 to 25% distal RCA, 25 to 50% diagonal 1, 0 to 25% mid circumflex.  Low risk NST 12/16/2018.  Also has prediabetes, BPH, palpitations in the setting of caffeine use 2019.   Patient last saw Dr. Delton SeeNelson 09/15/2020 and had stopped exercising and gained weight.  Was having some dyspnea with activity.  She recommended regular exercise and if he continued to have symptoms after several weeks consider coronary CTA.  Holter monitor showed very short episodes of NSVT 4 beats and 24 episodes of SVT longest lasting 9 seconds.  Echo normal systolic and diastolic function.  Patient comes in for f/u. Overall doing better. Doesn't feel palpitations and got an apple watch and no arrhythmias. It showed his pulse ox drops so he started using CPAP again. [facility]. Walks 1-2 miles daily. Drinks 3 cups coffee daily.      Past Medical History:  Diagnosis Date  . Back pain   . BPH (benign prostatic hyperplasia)   . CAD (coronary artery disease)    a. mild-mod by cardiac CT 03/2017.  Marland Kitchen. Chest pain   . Hyperlipidemia   . Hypertension   . OSA (obstructive sleep apnea)   . Pre-diabetes   . Spondylolisthesis     Past Surgical History:  Procedure Laterality Date  . MOUTH SURGERY  1982   3rd molers   . TONSILLECTOMY AND ADENOIDECTOMY  1959    Current Medications: Current Meds  Medication Sig  . alfuzosin (UROXATRAL) 10 MG 24 hr tablet  Take 10 mg by mouth daily with breakfast.  . aspirin 81 MG tablet Take 81 mg by mouth daily.  Marland Kitchen. atorvastatin (LIPITOR) 40 MG tablet Take 40 mg by mouth daily.  . B Complex-C (SUPER B-COMPLEX + VITAMIN C) TABS daily.   . cholecalciferol (VITAMIN D) 1000 UNITS tablet Take 2,000 Units by mouth daily.   . Coenzyme Q10 (COQ-10 PO) Take 100 mg by mouth daily.  Marland Kitchen. ezetimibe (ZETIA) 10 MG tablet TAKE 1 TABLET DAILY  . finasteride (PROSCAR) 5 MG tablet Take 5 mg by mouth daily.  Marland Kitchen. losartan-hydrochlorothiazide (HYZAAR) 50-12.5 MG tablet Take 1 tablet by mouth daily.  . Magnesium 250 MG TABS daily.   . sildenafil (VIAGRA) 50 MG tablet as needed.   . TURMERIC PO Take 1,000 mg by mouth daily.  . vitamin B-12 (CYANOCOBALAMIN) 1000 MCG tablet daily.   Marland Kitchen. VITAMIN E PO Take by mouth daily.     Allergies:   Oxytetracycline and Sulfa antibiotics   Social History   Socioeconomic History  . Marital status: Married    Spouse name: Not on file  . Number of children: Not on file  . Years of education: Not on file  . Highest education level: Not on file  Occupational History  . Not on file  Tobacco Use  . Smoking status: Never Smoker  . Smokeless tobacco: Never Used  Vaping Use  . Vaping Use: Never used  Substance and Sexual Activity  . Alcohol use: No  . Drug use: No  . Sexual activity: Not on file  Other Topics Concern  . Not on file  Social History Narrative  . Not on file   Social Determinants of Health   Financial Resource Strain: Not on file  Food Insecurity: Not on file  Transportation Needs: Not on file  Physical Activity: Not on file  Stress: Not on file  Social Connections: Not on file     Family History:  The patient's family history includes Breast cancer in his mother; COPD in his father and mother; Coronary artery disease in his mother; Heart Problems in his mother; Heart disease in his mother; Other in his brother.   ROS:   Please see the history of present illness.    ROS  All other systems reviewed and are negative.   PHYSICAL EXAM:   VS:  BP 132/88   Pulse 72   Ht 5\' 10"  (1.778 m)   Wt 210 lb (95.3 kg)   SpO2 97%   BMI 30.13 kg/m   Physical Exam  GEN: Well nourished, well developed, in no acute distress  Neck: no JVD, carotid bruits, or masses Cardiac:RRR; no murmurs, rubs, or gallops  Respiratory:  clear to auscultation bilaterally, normal work of breathing GI: soft, nontender, nondistended, + BS Ext: without cyanosis, clubbing, or edema, Good distal pulses bilaterally Neuro:  Alert and Oriented x 3, Psych: euthymic mood, full affect  Wt Readings from Last 3 Encounters:  01/17/21 210 lb (95.3 kg)  09/15/20 212 lb 6.4 oz (96.3 kg)  07/06/20 203 lb 12.8 oz (92.4 kg)      Studies/Labs Reviewed:   EKG:  EKG is not ordered today.    Recent Labs: 07/06/2020: ALT 23; BUN 17; Creatinine, Ser 1.01; Hemoglobin 14.8; Magnesium 2.0; Platelets 176; Potassium 3.6; Sodium 140   Lipid Panel    Component Value Date/Time   CHOL 135 07/06/2020 0850   TRIG 67 07/06/2020 0850   HDL 63 07/06/2020 0850   CHOLHDL 2.1 07/06/2020 0850   LDLCALC 58 07/06/2020 0850    Additional studies/ records that were reviewed today include:  2Decho 09/15/20 IMPRESSIONS     1. Left ventricular ejection fraction, by estimation, is 55 to 60%. The  left ventricle has normal function. The left ventricle has no regional  wall motion abnormalities. Left ventricular diastolic parameters were  normal.   2. Right ventricular systolic function is normal. The right ventricular  size is normal. Tricuspid regurgitation signal is inadequate for assessing  PA pressure.   3. The mitral valve is normal in structure. Trivial mitral valve  regurgitation. No evidence of mitral stenosis.   4. The aortic valve is tricuspid. Aortic valve regurgitation is not  visualized. No aortic stenosis is present.   5. The inferior vena cava is normal in size with greater than 50%  respiratory  variability, suggesting right atrial pressure of 3 mmHg.   Holter 08/25/20 Study Highlights     Patient had a min HR of 39 bpm, max HR of 210 bpm, and avg HR of 57 bpm.  1 run of Ventricular Tachycardia occurred lasting 4 beats with a max rate of 210 bpm.  24 short runs of Supraventricular Tachycardia runs occurred.   Predominant underlying rhythm was Sinus Rhythm.  1 run of Ventricular Tachycardia occurred lasting 4 beats. 24 short runs of Supraventricular Tachycardia runs occurred, the longest lasting  9.3 secs. Frequent Ventricular Bigeminy and Trigeminy were present.   Patient's symptoms correlated with SVTs. Please obtain an echocardiogram.      Exercise nuclear stress test 12/16/2018    The left ventricular ejection fraction is normal (55-65%).  Nuclear stress EF: 63%.  Blood pressure demonstrated a normal response to exercise.  There was no ST segment deviation noted during stress.  The study is normal.  This is a low risk study.   Normal stress nuclear study with no ischemia or infarction.  Gated ejection fraction 63% with normal wall motion.  Doing well you get your Covid vaccine very good also      Risk Assessment/Calculations:         ASSESSMENT:    1. Essential hypertension   2. Palpitations   3. Coronary artery disease involving native coronary artery of native heart without angina pectoris      PLAN:  In order of problems listed above:  Hypertension BP controlled  Palpitations with SVT and NSVT Holter 08/2020. Patient is having less symptoms and using CPAP now. Has apple watch and no arrhythmias. Echo normal LVEF. Offered low dose metoprolol to take prn but patient doesn't want since has slow HR's and doing better. Decrease caffeine and avoid decongestants.  Nonobstructive CAD on coronary CTA 2018, low risk NST 2000 recent dyspnea on exertion consider repeat CTA if continues after exercise program  Shared Decision Making/Informed Consent         Medication Adjustments/Labs and Tests Ordered: Current medicines are reviewed at length with the patient today.  Concerns regarding medicines are outlined above.  Medication changes, Labs and Tests ordered today are listed in the Patient Instructions below. Patient Instructions  Medication Instructions:  Your physician recommends that you continue on your current medications as directed. Please refer to the Current Medication list given to you today.  *If you need a refill on your cardiac medications before your next appointment, please call your pharmacy*   Lab Work: None ordered   If you have labs (blood work) drawn today and your tests are completely normal, you will receive your results only by: Marland Kitchen MyChart Message (if you have MyChart) OR . A paper copy in the mail If you have any lab test that is abnormal or we need to change your treatment, we will call you to review the results.   Testing/Procedures: None ordered    Follow-Up: At [facility], you and your health needs are our priority.  As part of our continuing mission to provide you with exceptional heart care, we have created designated Provider Care Teams.  These Care Teams include your primary Cardiologist (physician) and Advanced Practice Providers (APPs -  Physician Assistants and Nurse Practitioners) who all work together to provide you with the care you need, when you need it.  We recommend signing up for the patient portal called "MyChart".  Sign up information is provided on this After Visit Summary.  MyChart is used to connect with patients for Virtual Visits (Telemedicine).  Patients are able to view lab/test results, encounter notes, upcoming appointments, etc.  Non-urgent messages can be sent to your provider as well.   To learn more about what you can do with MyChart, go to [email or web address].    Your next appointment:   6 month(s)  The format for your next appointment:   In Person  Provider:    You may see Dr. Laurance Flatten or one of the following Advanced Practice Providers on your designated Care Team:  Tereso Newcomer, PA-C  [place], [place]    Other Instructions Please try and and incorporate 150 minutes of excercise a week, also work on cutting back your caffeine intake.      Elson Clan, PA-C  01/17/2021 10:20 AM    [facility] [address] [place], [place], [place]  [address] Phone: [phone]; Fax: [phone]

## 2021-01-17 ENCOUNTER — Ambulatory Visit (INDEPENDENT_AMBULATORY_CARE_PROVIDER_SITE_OTHER): Payer: Medicare PPO | Admitting: Physician Assistant

## 2021-01-17 ENCOUNTER — Encounter: Payer: Self-pay | Admitting: Physician Assistant

## 2021-01-17 ENCOUNTER — Other Ambulatory Visit: Payer: Self-pay

## 2021-01-17 VITALS — BP 132/88 | HR 72 | Ht 70.0 in | Wt 210.0 lb

## 2021-01-17 DIAGNOSIS — I10 Essential (primary) hypertension: Secondary | ICD-10-CM | POA: Diagnosis not present

## 2021-01-17 DIAGNOSIS — R002 Palpitations: Secondary | ICD-10-CM | POA: Diagnosis not present

## 2021-01-17 DIAGNOSIS — I2510 Atherosclerotic heart disease of native coronary artery without angina pectoris: Secondary | ICD-10-CM

## 2021-01-17 NOTE — Patient Instructions (Addendum)
Medication Instructions:  Your physician recommends that you continue on your current medications as directed. Please refer to the Current Medication list given to you today.  *If you need a refill on your cardiac medications before your next appointment, please call your pharmacy*   Lab Work: None ordered   If you have labs (blood work) drawn today and your tests are completely normal, you will receive your results only by: Marland Kitchen MyChart Message (if you have MyChart) OR . A paper copy in the mail If you have any lab test that is abnormal or we need to change your treatment, we will call you to review the results.   Testing/Procedures: None ordered    Follow-Up: At [facility], you and your health needs are our priority.  As part of our continuing mission to provide you with exceptional heart care, we have created designated Provider Care Teams.  These Care Teams include your primary Cardiologist (physician) and Advanced Practice Providers (APPs -  Physician Assistants and Nurse Practitioners) who all work together to provide you with the care you need, when you need it.  We recommend signing up for the patient portal called "MyChart".  Sign up information is provided on this After Visit Summary.  MyChart is used to connect with patients for Virtual Visits (Telemedicine).  Patients are able to view lab/test results, encounter notes, upcoming appointments, etc.  Non-urgent messages can be sent to your provider as well.   To learn more about what you can do with MyChart, go to [email or web address].    Your next appointment:   6 month(s)  The format for your next appointment:   In Person  Provider:   You may see Dr. Laurance Flatten or one of the following Advanced Practice Providers on your designated Care Team:    Tereso Newcomer, PA-C  [place], [place]    Other Instructions Please try and and incorporate 150 minutes of excercise a week, also work on cutting back your caffeine  intake.

## 2021-03-15 MED ORDER — EZETIMIBE 10 MG PO TABS: 10.0000 mg | ORAL_TABLET | Freq: Every day | ORAL | 3 refills | Status: DC

## 2021-03-30 ENCOUNTER — Encounter: Payer: Self-pay | Admitting: Physician Assistant

## 2021-07-16 DIAGNOSIS — R059 Cough, unspecified: Secondary | ICD-10-CM | POA: Diagnosis not present

## 2021-07-16 DIAGNOSIS — R0981 Nasal congestion: Secondary | ICD-10-CM | POA: Diagnosis not present

## 2021-07-16 DIAGNOSIS — U071 COVID-19: Secondary | ICD-10-CM | POA: Diagnosis not present

## 2021-07-16 DIAGNOSIS — R5383 Other fatigue: Secondary | ICD-10-CM | POA: Diagnosis not present

## 2021-10-14 DIAGNOSIS — U071 COVID-19: Secondary | ICD-10-CM | POA: Diagnosis not present

## 2021-12-25 ENCOUNTER — Other Ambulatory Visit: Payer: Self-pay

## 2021-12-25 MED ORDER — EZETIMIBE 10 MG PO TABS: 10.0000 mg | ORAL_TABLET | Freq: Every day | ORAL | 0 refills | Status: AC

## 2022-01-17 ENCOUNTER — Ambulatory Visit: Payer: Medicare PPO | Admitting: Physician Assistant

## 2022-01-23 ENCOUNTER — Other Ambulatory Visit: Payer: Self-pay

## 2022-01-23 ENCOUNTER — Ambulatory Visit (INDEPENDENT_AMBULATORY_CARE_PROVIDER_SITE_OTHER): Payer: Medicare PPO | Admitting: Physician Assistant

## 2022-01-23 ENCOUNTER — Encounter: Payer: Self-pay | Admitting: Physician Assistant

## 2022-01-23 VITALS — BP 138/76 | HR 49 | Ht 70.0 in | Wt 203.0 lb

## 2022-01-23 DIAGNOSIS — I10 Essential (primary) hypertension: Secondary | ICD-10-CM | POA: Diagnosis not present

## 2022-01-23 DIAGNOSIS — G4733 Obstructive sleep apnea (adult) (pediatric): Secondary | ICD-10-CM | POA: Diagnosis not present

## 2022-01-23 DIAGNOSIS — I2510 Atherosclerotic heart disease of native coronary artery without angina pectoris: Secondary | ICD-10-CM | POA: Diagnosis not present

## 2022-01-23 DIAGNOSIS — R7303 Prediabetes: Secondary | ICD-10-CM

## 2022-01-23 DIAGNOSIS — I471 Supraventricular tachycardia: Secondary | ICD-10-CM | POA: Diagnosis not present

## 2022-01-23 NOTE — Patient Instructions (Addendum)
Medication Instructions:  ?Your physician recommends that you continue on your current medications as directed. Please refer to the Current Medication list given to you today. ? ?*If you need a refill on your cardiac medications before your next appointment, please call your pharmacy* ? ? ?Lab Work: ?None ?If you have labs (blood work) drawn today and your tests are completely normal, you will receive your results only by: ?MyChart Message (if you have MyChart) OR ?A paper copy in the mail ?If you have any lab test that is abnormal or we need to change your treatment, we will call you to review the results. ? ? ?Follow-Up: ?At [facility], you and your health needs are our priority.  As part of our continuing mission to provide you with exceptional heart care, we have created designated Provider Care Teams.  These Care Teams include your primary Cardiologist (physician) and Advanced Practice Providers (APPs -  Physician Assistants and Nurse Practitioners) who all work together to provide you with the care you need, when you need it. ? ?Your next appointment:   ?1 year(s) ? ?The format for your next appointment:   ?In Person ? ?Provider:   ?Laurance Flatten, MD   ? ? ?Other Instructions ? ?With exercise your Heart Rate should be 106-129.  ? ?Two Gram Sodium Diet 2000 mg ? ?What is Sodium? Sodium is a mineral found naturally in many foods. The most significant source of sodium in the diet is table salt, which is about 40% sodium.  Processed, convenience, and preserved foods also contain a large amount of sodium.  The body needs only 500 mg of sodium daily to function,  A normal diet provides more than enough sodium even if you do not use salt. ? ?Why Limit Sodium? A build up of sodium in the body can cause thirst, increased blood pressure, shortness of breath, and water retention.  Decreasing sodium in the diet can reduce edema and risk of heart attack or stroke associated with high blood pressure.  Keep in mind  that there are many other factors involved in these health problems.  Heredity, obesity, lack of exercise, cigarette smoking, stress and what you eat all play a role. ? ?General Guidelines: ?Do not add salt at the table or in cooking.  One teaspoon of salt contains over 2 grams of sodium. ?Read food labels ?Avoid processed and convenience foods ?Ask your dietitian before eating any foods not dicussed in the menu planning guidelines ?Consult your physician if you wish to use a salt substitute or a sodium containing medication such as antacids.  Limit milk and milk products to 16 oz (2 cups) per day. ? ?Shopping Hints: ?READ LABELS!! "Dietetic" does not necessarily mean low sodium. ?Salt and other sodium ingredients are often added to foods during processing. ? ? ? ?Menu Planning Guidelines ?Food Group Choose More Often Avoid  ?Beverages (see also the milk group All fruit juices, low-sodium, salt-free vegetables juices, low-sodium carbonated beverages Regular vegetable or tomato juices, commercially softened water used for drinking or cooking  ?Breads and Cereals Enriched white, wheat, rye and pumpernickel bread, hard rolls and dinner rolls; muffins, cornbread and waffles; most dry cereals, cooked cereal without added salt; unsalted crackers and breadsticks; low sodium or homemade bread crumbs Bread, rolls and crackers with salted tops; quick breads; instant hot cereals; pancakes; commercial bread stuffing; self-rising flower and biscuit mixes; regular bread crumbs or cracker crumbs  ?Desserts and Sweets Desserts and sweets mad with mild should be within allowance  Instant pudding mixes and cake mixes  ?Fats Butter or margarine; vegetable oils; unsalted salad dressings, regular salad dressings limited to 1 Tbs; light, sour and heavy cream Regular salad dressings containing bacon fat, bacon bits, and salt pork; snack dips made with instant soup mixes or processed cheese; salted nuts  ?Fruits Most fresh, frozen and  canned fruits Fruits processed with salt or sodium-containing ingredient (some dried fruits are processed with sodium sulfites  ? ? ? ? ? ? ?Vegetables Fresh, frozen vegetables and low- sodium canned vegetables Regular canned vegetables, sauerkraut, pickled vegetables, and others prepared in brine; frozen vegetables in sauces; vegetables seasoned with ham, bacon or salt pork  ?Condiments, Sauces, Miscellaneous ? Salt substitute with physician's approval; pepper, herbs, spices; vinegar, lemon or lime juice; hot pepper sauce; garlic powder, onion powder, low sodium soy sauce (1 Tbs.); low sodium condiments (ketchup, chili sauce, mustard) in limited amounts (1 tsp.) fresh ground horseradish; unsalted tortilla chips, pretzels, potato chips, popcorn, salsa (1/4 cup) Any seasoning made with salt including garlic salt, celery salt, onion salt, and seasoned salt; sea salt, rock salt, kosher salt; meat tenderizers; monosodium glutamate; mustard, regular soy sauce, barbecue, sauce, chili sauce, teriyaki sauce, steak sauce, Worcestershire sauce, and most flavored vinegars; canned gravy and mixes; regular condiments; salted snack foods, olives, picles, relish, horseradish sauce, catsup  ? ?Food preparation: Try these seasonings ?Meats:    ?Pork Sage, onion Serve with applesauce  ?Chicken Poultry seasoning, thyme, parsley Serve with cranberry sauce  ?Lamb Curry powder, rosemary, garlic, thyme Serve with mint sauce or jelly  ?Veal Marjoram, basil Serve with current jelly, cranberry sauce  ?Beef Pepper, bay leaf Serve with dry mustard, unsalted chive butter  ?[place] leaf, dill Serve with unsalted lemon butter, unsalted parsley butter  ?Vegetables:    ?Asparagus Lemon juice   ?Broccoli Lemon juice   ?Carrots Mustard dressing parsley, mint, nutmeg, glazed with unsalted butter and sugar   ?Green beans Marjoram, lemon juice, nutmeg,dill seed   ?Tomatoes Basil, marjoram, onion   ?Spice /blend for "Salt Shaker" 4 tsp ground thyme ?1  tsp ground sage 3 tsp ground rosemary ?4 tsp ground marjoram  ? ?Test your knowledge ?A product that says "Salt Free" may still contain sodium. True or False ?Garlic Powder and Hot Pepper Sauce an be used as alternative seasonings.True or False ?Processed foods have more sodium than fresh foods.  True or False ?Canned Vegetables have less sodium than froze True or False ? ? ?WAYS TO DECREASE YOUR SODIUM INTAKE ?Avoid the use of added salt in cooking and at the table.  Table salt (and other prepared seasonings which contain salt) is probably one of the greatest sources of sodium in the diet.  Unsalted foods can gain flavor from the sweet, sour, and butter taste sensations of herbs and spices.  Instead of using salt for seasoning, try the following seasonings with the foods listed.  Remember: how you use them to enhance natural food flavors is limited only by your creativity... ?Allspice-Meat, fish, eggs, fruit, peas, red and yellow vegetables ?Almond Extract-Fruit baked goods ?Anise Seed-Sweet breads, fruit, carrots, beets, cottage cheese, cookies (tastes like licorice) ?Basil-Meat, fish, eggs, vegetables, rice, vegetables salads, soups, sauces ?Bay Leaf-Meat, fish, stews, poultry ?Burnet-Salad, vegetables (cucumber-like flavor) ?Caraway Seed-Bread, cookies, cottage cheese, meat, vegetables, cheese, rice ?Cardamon-Baked goods, fruit, soups ?Celery Powder or seed-Salads, salad dressings, sauces, meatloaf, soup, bread.Do not use  celery salt ?Chervil-Meats, salads, fish, eggs, vegetables, cottage cheese (parsley-like flavor) ?Chili Power-Meatloaf, chicken cheese, corn,  eggplant, egg dishes ?Chives-Salads cottage cheese, egg dishes, soups, vegetables, sauces ?Cilantro-Salsa, casseroles ?Cinnamon-Baked goods, fruit, pork, lamb, chicken, carrots ?Cloves-Fruit, baked goods, fish, pot roast, green beans, beets, carrots ?Coriander-Pastry, cookies, meat, salads, cheese (lemon-orange flavor) ?Cumin-Meatloaf, fish,cheese, eggs,  cabbage,fruit pie (caraway flavor) ?[employer], fruit, eggs, fish, poultry, cottage cheese, vegetables ?Dill Seed-Meat, cottage cheese, poultry, vegetables, fish, salads, bread ?Fennel Seed-Bread, co

## 2022-01-23 NOTE — Progress Notes (Signed)
? ?Cardiology Office Note   ? ?Date:  01/23/2022  ? ?ID:  Todd Kane, DOB 1956-02-10, MRN [id number] ? ? ?PCP:  Farris Has, MD ?  ?[facility]  ?Cardiologist:  Tobias Alexander, MD   ?Advanced Practice Provider:  Dyann Kief, PA-C ?Electrophysiologist:  None  ? ?[id number]}  ? ?Chief Complaint  ?Patient presents with  ? Follow-up  ? ? ?History of Present Illness:  ?Todd Kane is a 66 y.o. male with history of hypertension, HLD, nonobstructive CAD coronary CT 03/2017-59 to 69% mid LAD, 25 to 50% mid LAD, 25 to 50% proximal LAD, 0 to 25% distal RCA, 25 to 50% diagonal 1, 0 to 25% mid circumflex.  Low risk NST 12/16/2018.  Also has prediabetes, BPH, palpitations in the setting of caffeine use 2019. ? ?Patient last saw Dr. Delton See 09/15/2020 and had stopped exercising and gained weight.  Was having some dyspnea with activity.  She recommended regular exercise and if he continued to have symptoms after several weeks consider coronary CTA.  Holter monitor showed very short episodes of NSVT 4 beats and 24 episodes of SVT longest lasting 9 seconds.  Echo normal systolic and diastolic function. ? ?I saw the patient 01/17/21 and he was doing well.  ? ?Patient was flying in December and had dizziness and presyncope after eating breakfast. Lasted 20 min. Apple watch showed HR 43-90. O2 sats on watch 78-97. No further dizziness or presyncope. Not using cpap. 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 No one is managing it. Used to be managed by the [place]. No chest tightness, pressure. BP up and down and losartan/HCTZ was increased. Watches salt closely. Exercises regularly-goes to gym several days a week and does weights.  ?  ?  ? ? ? ?Past Medical History:  ?Diagnosis Date  ? Back pain   ? BPH (benign prostatic hyperplasia)   ? CAD (coronary artery disease)   ? a. mild-mod by cardiac CT 03/2017.  ? Chest pain   ? Hyperlipidemia   ? Hypertension   ? OSA (obstructive sleep apnea)   ? Pre-diabetes    ? Spondylolisthesis   ? ? ?Past Surgical History:  ?Procedure Laterality Date  ? MOUTH SURGERY  1982  ? 3rd molers   ? TONSILLECTOMY AND ADENOIDECTOMY  1959  ? ? ?Current Medications: ?Current Meds  ?Medication Sig  ? alfuzosin (UROXATRAL) 10 MG 24 hr tablet Take 10 mg by mouth daily with breakfast.  ? aspirin 81 MG tablet Take 81 mg by mouth daily.  ? atorvastatin (LIPITOR) 40 MG tablet Take 40 mg by mouth daily.  ? B Complex-C (SUPER B-COMPLEX + VITAMIN C) TABS daily.   ? cholecalciferol (VITAMIN D) 1000 UNITS tablet Take 2,000 Units by mouth daily.   ? Coenzyme Q10 (COQ-10 PO) Take 100 mg by mouth daily.  ? ezetimibe (ZETIA) 10 MG tablet Take 1 tablet (10 mg total) by mouth daily. Please keep upcoming appt in April 2023 with Cardiologist before anymore refills. Thank you Final Attempt  ? finasteride (PROSCAR) 5 MG tablet Take 5 mg by mouth daily.  ? losartan-hydrochlorothiazide (HYZAAR) 100-12.5 MG tablet Take 1 tablet by mouth daily.  ? Magnesium 250 MG TABS daily.   ? sildenafil (VIAGRA) 50 MG tablet as needed.   ? TURMERIC PO Take 1,000 mg by mouth daily.  ? vitamin B-12 (CYANOCOBALAMIN) 1000 MCG tablet daily.   ? VITAMIN E PO Take by mouth daily.  ?  ? ?Allergies:   Oxytetracycline and Sulfa  antibiotics  ? ?Social History  ? ?Socioeconomic History  ? Marital status: Married  ?  Spouse name: Not on file  ? Number of children: Not on file  ? Years of education: Not on file  ? Highest education level: Not on file  ?Occupational History  ? Not on file  ?Tobacco Use  ? Smoking status: Never  ? Smokeless tobacco: Never  ?Vaping Use  ? Vaping Use: Never used  ?Substance and Sexual Activity  ? Alcohol use: No  ? Drug use: No  ? Sexual activity: Not on file  ?Other Topics Concern  ? Not on file  ?Social History Narrative  ? Not on file  ? ?Social Determinants of Health  ? ?Financial Resource Strain: Not on file  ?Food Insecurity: Not on file  ?Transportation Needs: Not on file  ?Physical Activity: Not on file   ?Stress: Not on file  ?Social Connections: Not on file  ?  ? ?Family History:  The patient's  family history includes Breast cancer in his mother; COPD in his father and mother; Coronary artery disease in his mother; Heart Problems in his mother; Heart disease in his mother; Other in his brother.  ? ?ROS:   ?Please see the history of present illness.    ?ROS All other systems reviewed and are negative. ? ? ?PHYSICAL EXAM:   ?VS:  BP 138/76   Pulse (!) 49   Ht 5\' 10"  (1.778 m)   Wt 203 lb (92.1 kg)   SpO2 97%   BMI 29.13 kg/m?   ?Physical Exam  ?GEN: Well nourished, well developed, in no acute distress  ?Neck: no JVD, carotid bruits, or masses ?Cardiac:RRR; no murmurs, rubs, or gallops  ?Respiratory:  clear to auscultation bilaterally, normal work of breathing ?GI: soft, nontender, nondistended, + BS ?Ext: without cyanosis, clubbing, or edema, Good distal pulses bilaterally ?Neuro:  Alert and Oriented x 3, ?Psych: euthymic mood, full affect ? ?Wt Readings from Last 3 Encounters:  ?01/23/22 203 lb (92.1 kg)  ?01/17/21 210 lb (95.3 kg)  ?09/15/20 212 lb 6.4 oz (96.3 kg)  ?  ? ? ?Studies/Labs Reviewed:  ? ?EKG:  EKG is  ordered today.  The ekg ordered today demonstrates sinus bradycardia  ? ?Recent Labs: ?No results found for requested labs within last 8760 hours.  ? ?Lipid Panel ?   ?Component Value Date/Time  ? CHOL 135 07/06/2020 0850  ? TRIG 67 07/06/2020 0850  ? HDL 63 07/06/2020 0850  ? CHOLHDL 2.1 07/06/2020 0850  ? LDLCALC 58 07/06/2020 0850  ? ? ?Additional studies/ records that were reviewed today include:  ?2Decho 09/15/20 ?IMPRESSIONS  ? ? ? 1. Left ventricular ejection fraction, by estimation, is 55 to 60%. The  ?left ventricle has normal function. The left ventricle has no regional  ?wall motion abnormalities. Left ventricular diastolic parameters were  ?normal.  ? 2. Right ventricular systolic function is normal. The right ventricular  ?size is normal. Tricuspid regurgitation signal is inadequate  for assessing  ?PA pressure.  ? 3. The mitral valve is normal in structure. Trivial mitral valve  ?regurgitation. No evidence of mitral stenosis.  ? 4. The aortic valve is tricuspid. Aortic valve regurgitation is not  ?visualized. No aortic stenosis is present.  ? 5. The inferior vena cava is normal in size with greater than 50%  ?respiratory variability, suggesting right atrial pressure of 3 mmHg.  ?  ?Holter 08/25/20 ?Study Highlights ?  ?  ?Patient had a min HR of  39 bpm, max HR of 210 bpm, and avg HR of 57 bpm. ?1 run of Ventricular Tachycardia occurred lasting 4 beats with a max rate of 210 bpm. ?24 short runs of Supraventricular Tachycardia runs occurred. ?  ?Predominant underlying rhythm was Sinus Rhythm.  ?1 run of Ventricular Tachycardia occurred lasting 4 beats. ?24 short runs of Supraventricular Tachycardia runs occurred, the longest lasting 9.3 secs. ?Frequent Ventricular Bigeminy and Trigeminy were present. ?  ?Patient's symptoms correlated with SVTs. ?Please obtain an echocardiogram. ?  ?  ?  ?  ?Exercise nuclear stress test 12/16/2018 ?  ?The left ventricular ejection fraction is normal (55-65%). ?Nuclear stress EF: 63%. ?Blood pressure demonstrated a normal response to exercise. ?There was no ST segment deviation noted during stress. ?The study is normal. ?This is a low risk study. ?  ?Normal stress nuclear study with no ischemia or infarction.  Gated ejection fraction 63% with normal wall motion.  Doing well you get your Covid vaccine very good also ?  ?  ?  ? ? ?Risk Assessment/Calculations:   ?  ? ? ? ? ?ASSESSMENT:   ? ?1. SVT (supraventricular tachycardia) (HCC)   ?2. Essential hypertension   ?3. Coronary artery calcification seen on CAT scan   ?4. Pre-diabetes   ?5. OSA (obstructive sleep apnea)   ? ? ? ?PLAN:  ?In order of problems listed above: ? ?Hypertension BP controlled on increased Hyzaar ?  ?Palpitations with SVT and NSVT Holter 08/2020. Echo normal LVEF.episode of dizziness and  presyncope while flying in December after eating. Apple watch didn't pick up any arrhythmia. No symptoms since. He'll let us know if recurrence ?  ?Nonobstructive CAD on coronary CTA 2018, low risk NST 2000 -no angina. Cont

## 2022-02-27 ENCOUNTER — Ambulatory Visit: Payer: Medicare PPO | Admitting: Physician Assistant

## 2022-03-08 DIAGNOSIS — M4316 Spondylolisthesis, lumbar region: Secondary | ICD-10-CM | POA: Diagnosis not present

## 2022-03-29 DIAGNOSIS — N400 Enlarged prostate without lower urinary tract symptoms: Secondary | ICD-10-CM | POA: Diagnosis not present

## 2022-03-29 DIAGNOSIS — I10 Essential (primary) hypertension: Secondary | ICD-10-CM | POA: Diagnosis not present

## 2022-03-29 DIAGNOSIS — G4737 Central sleep apnea in conditions classified elsewhere: Secondary | ICD-10-CM | POA: Diagnosis not present

## 2022-03-29 DIAGNOSIS — G4733 Obstructive sleep apnea (adult) (pediatric): Secondary | ICD-10-CM | POA: Diagnosis not present

## 2022-03-29 DIAGNOSIS — M549 Dorsalgia, unspecified: Secondary | ICD-10-CM | POA: Diagnosis not present

## 2022-07-10 DIAGNOSIS — M4316 Spondylolisthesis, lumbar region: Secondary | ICD-10-CM | POA: Diagnosis not present

## 2022-07-10 DIAGNOSIS — M21372 Foot drop, left foot: Secondary | ICD-10-CM | POA: Diagnosis not present

## 2022-07-11 DIAGNOSIS — R200 Anesthesia of skin: Secondary | ICD-10-CM | POA: Diagnosis not present

## 2022-07-11 DIAGNOSIS — M4316 Spondylolisthesis, lumbar region: Secondary | ICD-10-CM | POA: Diagnosis not present

## 2022-07-11 DIAGNOSIS — M545 Low back pain, unspecified: Secondary | ICD-10-CM | POA: Diagnosis not present

## 2022-07-12 DIAGNOSIS — M21372 Foot drop, left foot: Secondary | ICD-10-CM | POA: Diagnosis not present

## 2022-07-12 DIAGNOSIS — M5416 Radiculopathy, lumbar region: Secondary | ICD-10-CM | POA: Diagnosis not present

## 2022-07-12 DIAGNOSIS — Z6828 Body mass index (BMI) 28.0-28.9, adult: Secondary | ICD-10-CM | POA: Diagnosis not present

## 2022-07-12 DIAGNOSIS — M4316 Spondylolisthesis, lumbar region: Secondary | ICD-10-CM | POA: Diagnosis not present

## 2022-07-16 ENCOUNTER — Ambulatory Visit: Payer: Medicare PPO | Attending: Neurological Surgery | Admitting: Rehabilitative and Restorative Service Providers"

## 2022-07-16 ENCOUNTER — Other Ambulatory Visit: Payer: Self-pay

## 2022-07-16 ENCOUNTER — Encounter: Payer: Self-pay | Admitting: Rehabilitative and Restorative Service Providers"

## 2022-07-16 DIAGNOSIS — M5459 Other low back pain: Secondary | ICD-10-CM | POA: Insufficient documentation

## 2022-07-16 DIAGNOSIS — M21372 Foot drop, left foot: Secondary | ICD-10-CM | POA: Diagnosis not present

## 2022-07-16 DIAGNOSIS — R252 Cramp and spasm: Secondary | ICD-10-CM

## 2022-07-16 DIAGNOSIS — M6281 Muscle weakness (generalized): Secondary | ICD-10-CM | POA: Diagnosis not present

## 2022-07-16 DIAGNOSIS — R2689 Other abnormalities of gait and mobility: Secondary | ICD-10-CM | POA: Insufficient documentation

## 2022-07-16 NOTE — Therapy (Signed)
OUTPATIENT PHYSICAL THERAPY THORACOLUMBAR EVALUATION   Patient Name: Todd Kane MRN: [id number] DOB:1956-05-10, 66 y.o., male Today's Date: 07/16/2022   PT End of Session - 07/16/22 0849     Visit Number 1    Date for PT Re-Evaluation 09/07/22    Authorization Type [employer] - auth requested    Progress Note Due on Visit 10    PT Start Time 0840    PT Stop Time 0920    PT Time Calculation (min) 40 min    Activity Tolerance Patient tolerated treatment well    Behavior During Therapy [facility] for tasks assessed/performed             Past Medical History:  Diagnosis Date   Back pain    BPH (benign prostatic hyperplasia)    CAD (coronary artery disease)    a. mild-mod by cardiac CT 03/2017.   Chest pain    Hyperlipidemia    Hypertension    OSA (obstructive sleep apnea)    Pre-diabetes    Spondylolisthesis    Past Surgical History:  Procedure Laterality Date   MOUTH SURGERY  1982   3rd molars   TONSILLECTOMY AND ADENOIDECTOMY  1959   Patient Active Problem List   Diagnosis Date Noted   Pre-diabetes 08/03/2020   Coronary artery calcification seen on CAT scan 01/14/2018   Family history of early CAD 01/14/2018   Chest pain 01/29/2017   Chest pain of uncertain etiology 12/23/2014   Dyspnea on exertion 12/23/2014   Sciatica of right side 12/23/2014   Essential hypertension 12/23/2014   Hypercholesterolemia 12/23/2014    PCP: Farris Has, MD  REFERRING PROVIDER: [name], MD   REFERRING DIAG: [phone] (ICD-10-CM) - Foot drop, left foot   Rationale for Evaluation and Treatment Rehabilitation  THERAPY DIAG:  Other abnormalities of gait and mobility - Plan: PT plan of care cert/re-cert  Muscle weakness (generalized) - Plan: PT plan of care cert/re-cert  Other low back pain - Plan: PT plan of care cert/re-cert  Cramp and spasm - Plan: PT plan of care cert/re-cert  ONSET DATE: Reports history of back pain, but foot drop started approx 4 weeks  ago  SUBJECTIVE:                                                                                                                                                                                           SUBJECTIVE STATEMENT: Pt reports that he has had a history of spondylolisthesis of L3-5 for years.  However, approximately 4 weeks ago, he started to develop foot drop.  Pt reports that he is trying to avoid surgery, so  his MD recommended that he try PT and steroids.  Pt reports that today was his last pill of steroid medication.  He is hoping that with PT, he can improve his walking and learn exercises to help with his back should he require surgery.  PERTINENT HISTORY:  Lumbar Spondylolisthesis  PAIN:  Are you having pain? Yes: NPRS scale: 3-4/10 Pain location: low back Pain description: ache Aggravating factors: standing for prolonged periods and slow walks Relieving factors: sitting   PRECAUTIONS: None  WEIGHT BEARING RESTRICTIONS No  FALLS:  Has patient fallen in last 6 months? No  LIVING ENVIRONMENT: Lives with: lives with their spouse Lives in: House/apartment Stairs: Yes: Internal: 14 steps; on right going up and on left going up and External: 3 steps; on right going up Has following equipment at home: None  OCCUPATION: Retired Cabin crew for 30 years  PLOF: Independent and Leisure: golfing  PATIENT GOALS:  To avoid surgery and walk without foot drop.   OBJECTIVE:   DIAGNOSTIC FINDINGS:  Reports MRI of back, pt reports that he has lumbar spondylolisthesis of L3-5  PATIENT SURVEYS:  LEFS 64 / 80 = 80.0 %  SCREENING FOR RED FLAGS: Bowel or bladder incontinence: No Spinal tumors: No Cauda equina syndrome: No Compression fracture: No Abdominal aneurysm: No  COGNITION:  Overall cognitive status: Within functional limits for tasks assessed     SENSATION: Reports some numbness at times down left leg primarily, but at times down right leg  MUSCLE LENGTH: Tightness  bilateral hamstrings (Left 43 deg, Right 52 deg)  POSTURE: rounded shoulders, forward head, and decreased lumbar lordosis  PALPATION: Tenderness to palpation along lumbar region with muscle spasms noted along lumbar multifidi  LUMBAR ROM:  WFL with some pain at times  LOWER EXTREMITY MMT:    07/16/2022: Right hip and knee strength 5/5 Right ankle dorsiflexion 4/5 Left hip 4+/5 Left dorsiflexion 3/5  FUNCTIONAL TESTS:  07/16/2022: Single leg stance:  right LE 19 sec, left LE 1.8 sec  GAIT: Distance walked: >200 ft Assistive device utilized: None Level of assistance: Complete Independence Comments: Pt with some instability noted with ankles and decreased ankle dorsiflexion    TODAY'S TREATMENT  07/16/2022:  Performed one set of HEP (see below)   PATIENT EDUCATION:  Education details: Issued HEP Person educated: Patient Education method: Explanation, Demonstration, and Handouts Education comprehension: verbalized understanding and returned demonstration   HOME EXERCISE PROGRAM: Access Code: [email or web address] URL: [email or web address] Date: 07/16/2022 Prepared by: Clydie Braun Avish Torry  Exercises - Supine Lower Trunk Rotation  - 1 x daily - 7 x weekly - 1 sets - 5 reps - 10 sec hold - Supine Transversus Abdominis Bracing - Hands on Stomach  - 1 x daily - 7 x weekly - 2 sets - 10 reps - Supine Piriformis Stretch with Foot on Ground  - 1 x daily - 7 x weekly - 1 sets - 2 reps - 20 sec hold - Supine Hamstring Stretch with Strap  - 1 x daily - 7 x weekly - 1 sets - 2 reps - 20 sec hold - Supine Single Knee to Chest Stretch  - 1 x daily - 7 x weekly - 1 sets - 3 reps - 20 sec hold - Seated Toe Raise  - 1 x daily - 7 x weekly - 2 sets - 10 reps - Heel Toe Raises with Counter Support  - 1 x daily - 7 x weekly - 2 sets - 10 reps  ASSESSMENT:  CLINICAL IMPRESSION: Patient  is a 66 y.o. male who was seen today for physical therapy evaluation and treatment for foot drop. Pt  reports a history of lumbar spondylolisthesis and states that he is following up with Dr Yetta Barre for that, but they wanted to try PT to see if it would assist with his foot drop and hopefully avoid/delay surgery.  Patient's PLOF is able to play golf and travel.  Pt presents with back pain, decreased balance, muscle spasms, and muscle weakness.  Pt with foot drop noted bilaterally, but predominately on left LLE.  Pt would benefit from skilled PT to progress towards goal related activities and decrease is foot drop noted with activities.   OBJECTIVE IMPAIRMENTS decreased balance, difficulty walking, decreased strength, impaired flexibility, postural dysfunction, and pain.   ACTIVITY LIMITATIONS locomotion level  PARTICIPATION LIMITATIONS: community activity  PERSONAL FACTORS 1 comorbidity: lumbar spondylolisthesis  are also affecting patient's functional outcome.   REHAB POTENTIAL: Good  CLINICAL DECISION MAKING: Evolving/moderate complexity  EVALUATION COMPLEXITY: Moderate   GOALS: Goals reviewed with patient? Yes  SHORT TERM GOALS: Target date: 08/06/2022  Pt will be independent with initial HEP. Baseline: Goal status: INITIAL  2.  Pt will be able to perform heel walking in clinic with UE support. Baseline:  Goal status: INITIAL    LONG TERM GOALS: Target date: 09/07/2022  Pt will be independent with advanced HEP. Baseline:  Goal status: INITIAL  2.  Pt will be able to maintain right single leg stance for at least 30 sec and LLE for at least 15 sec to decrease his risk of falling. Baseline: 19 sec RLE, 1.8 sec LLE Goal status: INITIAL  3.  Pt will increase LEFS to at least 90% to demonstrate improvement in functional mobility. Baseline: 80% Goal status: INITIAL  4.  Pt will increase bilateral dorsiflexion strength to at least 4+/5 to allow him to ambulate with decreased foot drop. Baseline:  Goal status: INITIAL  5.  Pt will report ability to play golf with no immediate  increase in back pain. Baseline:  Goal status: INITIAL    PLAN: PT FREQUENCY: 2x/week  PT DURATION: 8 weeks  PLANNED INTERVENTIONS: Therapeutic exercises, Therapeutic activity, Neuromuscular re-education, Balance training, Gait training, Patient/Family education, Self Care, Joint mobilization, Joint manipulation, Stair training, Aquatic Therapy, Dry Needling, Electrical stimulation, Spinal manipulation, Spinal mobilization, Cryotherapy, Moist heat, Taping, Vasopneumatic device, Traction, Ultrasound, Ionotophoresis 4mg /ml Dexamethasone, Manual therapy, and Re-evaluation.  PLAN FOR NEXT SESSION: assess and progress HEP as indicated, strengthening, core stability, flexibility   Kaedynce Tapp, PT 07/16/2022, 12:03 PM

## 2022-07-18 ENCOUNTER — Encounter: Payer: Self-pay | Admitting: Rehabilitative and Restorative Service Providers"

## 2022-07-18 ENCOUNTER — Ambulatory Visit: Payer: Medicare PPO | Admitting: Rehabilitative and Restorative Service Providers"

## 2022-07-18 DIAGNOSIS — M6281 Muscle weakness (generalized): Secondary | ICD-10-CM

## 2022-07-18 DIAGNOSIS — R2689 Other abnormalities of gait and mobility: Secondary | ICD-10-CM | POA: Diagnosis not present

## 2022-07-18 DIAGNOSIS — M21372 Foot drop, left foot: Secondary | ICD-10-CM | POA: Diagnosis not present

## 2022-07-18 DIAGNOSIS — M5459 Other low back pain: Secondary | ICD-10-CM | POA: Diagnosis not present

## 2022-07-18 DIAGNOSIS — R252 Cramp and spasm: Secondary | ICD-10-CM

## 2022-07-18 NOTE — Therapy (Signed)
OUTPATIENT PHYSICAL THERAPY TREATMENT NOTE   Patient Name: Todd Kane MRN: [id number] DOB:1956/07/24, 66 y.o., male Today's Date: 07/18/2022   PT End of Session - 07/18/22 0800     Visit Number 2    Date for PT Re-Evaluation 09/07/22    Authorization Type [employer] - auth requested    Progress Note Due on Visit 10    PT Start Time 0759    PT Stop Time 0840    PT Time Calculation (min) 41 min    Activity Tolerance Patient tolerated treatment well    Behavior During Therapy [facility] for tasks assessed/performed             Past Medical History:  Diagnosis Date   Back pain    BPH (benign prostatic hyperplasia)    CAD (coronary artery disease)    a. mild-mod by cardiac CT 03/2017.   Chest pain    Hyperlipidemia    Hypertension    OSA (obstructive sleep apnea)    Pre-diabetes    Spondylolisthesis    Past Surgical History:  Procedure Laterality Date   MOUTH SURGERY  1982   3rd molars   TONSILLECTOMY AND ADENOIDECTOMY  1959   Patient Active Problem List   Diagnosis Date Noted   Pre-diabetes 08/03/2020   Coronary artery calcification seen on CAT scan 01/14/2018   Family history of early CAD 01/14/2018   Chest pain 01/29/2017   Chest pain of uncertain etiology 12/23/2014   Dyspnea on exertion 12/23/2014   Sciatica of right side 12/23/2014   Essential hypertension 12/23/2014   Hypercholesterolemia 12/23/2014    PCP: Farris Has, MD  REFERRING PROVIDER: [name], MD   REFERRING DIAG: [id number] (ICD-10-CM) - Foot drop, left foot   Rationale for Evaluation and Treatment Rehabilitation  THERAPY DIAG:  Other abnormalities of gait and mobility  Muscle weakness (generalized)  Other low back pain  Cramp and spasm  ONSET DATE: Reports history of back pain, but foot drop started approx 4 weeks ago  SUBJECTIVE:                                                                                                                                                                                            SUBJECTIVE STATEMENT: Pt reports having some left sided low back/buttock pain today.  States some difficulty with lower trunk rotation  PERTINENT HISTORY:  Lumbar Spondylolisthesis  PAIN:  Are you having pain? Yes: NPRS scale: 4-5/10 Pain location: low back Pain description: ache Aggravating factors: standing for prolonged periods and slow walks Relieving factors: sitting   PRECAUTIONS: None  WEIGHT BEARING RESTRICTIONS No  FALLS:  Has patient fallen in last 6 months? No  LIVING ENVIRONMENT: Lives with: lives with their spouse Lives in: House/apartment Stairs: Yes: Internal: 14 steps; on right going up and on left going up and External: 3 steps; on right going up Has following equipment at home: None  OCCUPATION: Retired Cabin crew for 30 years  PLOF: Independent and Leisure: golfing  PATIENT GOALS:  To avoid surgery and walk without foot drop.   OBJECTIVE:   DIAGNOSTIC FINDINGS:  Reports MRI of back, pt reports that he has lumbar spondylolisthesis of L3-5  PATIENT SURVEYS:  07/16/2022:  LEFS 64 / 80 = 80.0 %  SENSATION: Reports some numbness at times down left leg primarily, but at times down right leg  MUSCLE LENGTH: Tightness bilateral hamstrings (Left 43 deg, Right 52 deg)  POSTURE: rounded shoulders, forward head, and decreased lumbar lordosis  PALPATION: Tenderness to palpation along lumbar region with muscle spasms noted along lumbar multifidi  LUMBAR ROM:  WFL with some pain at times  LOWER EXTREMITY MMT:    07/16/2022: Right hip and knee strength 5/5 Right ankle dorsiflexion 4/5 Left hip 4+/5 Left dorsiflexion 3/5  FUNCTIONAL TESTS:  07/16/2022: Single leg stance:  right LE 19 sec, left LE 1.8 sec  GAIT: Distance walked: >200 ft Assistive device utilized: None Level of assistance: Complete Independence Comments: Pt with some instability noted with ankles and decreased ankle  dorsiflexion    TODAY'S TREATMENT  07/18/2022: Nustep level 4 LE only x7 minutes with PT present to discuss status Supine posterior pelvic tilt 2x10 (pt reports some pain) Supine SKTC 2x20 sec bilat Supine piriformis stretch 2x20 sec bilat Hooklying TA activation with pressing red pball into thighs 2x10 Lower trunk rotation with smaller range of motion 2x10 Supine hamstring stretch with strap 2x20 sec bilat Prone on elbows with pillow under abdomen Seated with 4# ankle weights:  heel/toe raises, LAQ, marching, hip abduction scissors.  BLE x15   07/16/2022:  Performed one set of HEP (see below)   PATIENT EDUCATION:  Education details: Issued HEP Person educated: Patient Education method: Explanation, Demonstration, and Handouts Education comprehension: verbalized understanding and returned demonstration   HOME EXERCISE PROGRAM: Access Code: [email or web address] URL: [email or web address] Date: 07/16/2022 Prepared by: Todd Kane  Exercises - Supine Lower Trunk Rotation  - 1 x daily - 7 x weekly - 1 sets - 5 reps - 10 sec hold - Supine Transversus Abdominis Bracing - Hands on Stomach  - 1 x daily - 7 x weekly - 2 sets - 10 reps - Supine Piriformis Stretch with Foot on Ground  - 1 x daily - 7 x weekly - 1 sets - 2 reps - 20 sec hold - Supine Hamstring Stretch with Strap  - 1 x daily - 7 x weekly - 1 sets - 2 reps - 20 sec hold - Supine Single Knee to Chest Stretch  - 1 x daily - 7 x weekly - 1 sets - 3 reps - 20 sec hold - Seated Toe Raise  - 1 x daily - 7 x weekly - 2 sets - 10 reps - Heel Toe Raises with Counter Support  - 1 x daily - 7 x weekly - 2 sets - 10 reps  ASSESSMENT:  CLINICAL IMPRESSION: Todd Kane presents to skilled PT reporting that he has been having some difficulty with lower trunk rotation.  During session, provided cuing with performing less movement and staying in pain-free range and pt able to return demonstration.  Pt with some reports of centralization  of  pain during prone on elbows, but required pillow under abdomen for comfort.  Pt continues to require skilled PT to progress towards goal related activities.   OBJECTIVE IMPAIRMENTS decreased balance, difficulty walking, decreased strength, impaired flexibility, postural dysfunction, and pain.   ACTIVITY LIMITATIONS locomotion level  PARTICIPATION LIMITATIONS: community activity  PERSONAL FACTORS 1 comorbidity: lumbar spondylolisthesis  are also affecting patient's functional outcome.   REHAB POTENTIAL: Good  CLINICAL DECISION MAKING: Evolving/moderate complexity  EVALUATION COMPLEXITY: Moderate   GOALS: Goals reviewed with patient? Yes  SHORT TERM GOALS: Target date: 08/06/2022  Pt will be independent with initial HEP. Baseline: Goal status: IN PROGRESS  2.  Pt will be able to perform heel walking in clinic with UE support. Baseline:  Goal status: INITIAL    LONG TERM GOALS: Target date: 09/07/2022  Pt will be independent with advanced HEP. Baseline:  Goal status: INITIAL  2.  Pt will be able to maintain right single leg stance for at least 30 sec and LLE for at least 15 sec to decrease his risk of falling. Baseline: 19 sec RLE, 1.8 sec LLE Goal status: INITIAL  3.  Pt will increase LEFS to at least 90% to demonstrate improvement in functional mobility. Baseline: 80% Goal status: INITIAL  4.  Pt will increase bilateral dorsiflexion strength to at least 4+/5 to allow him to ambulate with decreased foot drop. Baseline:  Goal status: INITIAL  5.  Pt will report ability to play golf with no immediate increase in back pain. Baseline:  Goal status: INITIAL    PLAN: PT FREQUENCY: 2x/week  PT DURATION: 8 weeks  PLANNED INTERVENTIONS: Therapeutic exercises, Therapeutic activity, Neuromuscular re-education, Balance training, Gait training, Patient/Family education, Self Care, Joint mobilization, Joint manipulation, Stair training, Aquatic Therapy, Dry Needling,  Electrical stimulation, Spinal manipulation, Spinal mobilization, Cryotherapy, Moist heat, Taping, Vasopneumatic device, Traction, Ultrasound, Ionotophoresis 4mg /ml Dexamethasone, Manual therapy, and Re-evaluation.  PLAN FOR NEXT SESSION: assess and progress HEP as indicated, strengthening, core stability, flexibility   , PT 07/18/2022, 10:02 AM   [facility] [address] [place], [place] Phone # [phone] Fax [phone]

## 2022-07-25 ENCOUNTER — Ambulatory Visit: Payer: Medicare PPO

## 2022-07-25 DIAGNOSIS — R2689 Other abnormalities of gait and mobility: Secondary | ICD-10-CM

## 2022-07-25 DIAGNOSIS — M5459 Other low back pain: Secondary | ICD-10-CM

## 2022-07-25 DIAGNOSIS — R252 Cramp and spasm: Secondary | ICD-10-CM

## 2022-07-25 DIAGNOSIS — M6281 Muscle weakness (generalized): Secondary | ICD-10-CM | POA: Diagnosis not present

## 2022-07-25 DIAGNOSIS — M21372 Foot drop, left foot: Secondary | ICD-10-CM | POA: Diagnosis not present

## 2022-07-25 NOTE — Therapy (Signed)
OUTPATIENT PHYSICAL THERAPY TREATMENT NOTE   Patient Name: Todd Kane MRN: [id number] DOB:[birth year], 66 y.o., male Today's Date: 07/25/2022   PT End of Session - 07/25/22 1240     Visit Number 3    Date for PT Re-Evaluation 09/07/22    Authorization Type [employer] - auth requested    PT Start Time 1240    PT Stop Time 1320    PT Time Calculation (min) 40 min    Activity Tolerance Patient tolerated treatment well    Behavior During Therapy [facility] for tasks assessed/performed             Past Medical History:  Diagnosis Date   Back pain    BPH (benign prostatic hyperplasia)    CAD (coronary artery disease)    a. mild-mod by cardiac CT 03/2017.   Chest pain    Hyperlipidemia    Hypertension    OSA (obstructive sleep apnea)    Pre-diabetes    Spondylolisthesis    Past Surgical History:  Procedure Laterality Date   MOUTH SURGERY  1982   3rd molars   TONSILLECTOMY AND ADENOIDECTOMY  1959   Patient Active Problem List   Diagnosis Date Noted   Pre-diabetes 08/03/2020   Coronary artery calcification seen on CAT scan 01/14/2018   Family history of early CAD 01/14/2018   Chest pain 01/29/2017   Chest pain of uncertain etiology 00/86/7619   Dyspnea on exertion 12/23/2014   Sciatica of right side 12/23/2014   Essential hypertension 12/23/2014   Hypercholesterolemia 12/23/2014    PCP: London Pepper, MD  REFERRING PROVIDER: Eustace Moore, MD   REFERRING DIAG: [phone] (ICD-10-CM) - Foot drop, left foot   Rationale for Evaluation and Treatment Rehabilitation  THERAPY DIAG:  Other abnormalities of gait and mobility  Muscle weakness (generalized)  Other low back pain  Cramp and spasm  ONSET DATE: Reports history of back pain, but foot drop started approx 4 weeks ago  SUBJECTIVE:                                                                                                                                                                                            SUBJECTIVE STATEMENT: Pt reports about the same.    PERTINENT HISTORY:  Lumbar Spondylolisthesis  PAIN:  Are you having pain? Yes: NPRS scale: 4-5/10 Pain location: low back Pain description: ache Aggravating factors: standing for prolonged periods and slow walks Relieving factors: sitting   PRECAUTIONS: None  WEIGHT BEARING RESTRICTIONS No  FALLS:  Has patient fallen in last 6 months? No  LIVING ENVIRONMENT: Lives with: lives with their spouse Lives in:  House/apartment Stairs: Yes: Internal: 14 steps; on right going up and on left going up and External: 3 steps; on right going up Has following equipment at home: None  OCCUPATION: Retired Cabin crew for 30 years  PLOF: Independent and Leisure: golfing  PATIENT GOALS:  To avoid surgery and walk without foot drop.   OBJECTIVE:   DIAGNOSTIC FINDINGS:  Reports MRI of back, pt reports that he has lumbar spondylolisthesis of L3-5  PATIENT SURVEYS:  07/16/2022:  LEFS 64 / 80 = 80.0 %  SENSATION: Reports some numbness at times down left leg primarily, but at times down right leg  MUSCLE LENGTH: Tightness bilateral hamstrings (Left 43 deg, Right 52 deg)  POSTURE: rounded shoulders, forward head, and decreased lumbar lordosis  PALPATION: Tenderness to palpation along lumbar region with muscle spasms noted along lumbar multifidi  LUMBAR ROM:  WFL with some pain at times  LOWER EXTREMITY MMT:    07/16/2022: Right hip and knee strength 5/5 Right ankle dorsiflexion 4/5 Left hip 4+/5 Left dorsiflexion 3/5  FUNCTIONAL TESTS:  07/16/2022: Single leg stance:  right LE 19 sec, left LE 1.8 sec  GAIT: Distance walked: >200 ft Assistive device utilized: None Level of assistance: Complete Independence Comments: Pt with some instability noted with ankles and decreased ankle dorsiflexion    TODAY'S TREATMENT  07/25/2022: BAPS board: DF/PF, inv/ev, circle cw, circles ccw x 20 each Supine posterior pelvic tilt  2x10 (needed mod verbal cues for correct technique but no pain today) PPT x 20 PPT with 90/90 heel tap x 20 PPT with dying bug x 20 PPT with physio ball pass x 20 4 way ankle tband (red) x 20 each (therapist holding band for isolated DF, PF, inv, and ev) Lower trunk rotation with smaller range of motion 2x10 Supine hamstring stretch with strap 2x20 sec bilat  07/18/2022: Nustep level 4 LE only x7 minutes with PT present to discuss status Supine posterior pelvic tilt 2x10 (pt reports some pain) Supine SKTC 2x20 sec bilat Supine piriformis stretch 2x20 sec bilat Hooklying TA activation with pressing red pball into thighs 2x10 Lower trunk rotation with smaller range of motion 2x10 Supine hamstring stretch with strap 2x20 sec bilat Prone on elbows with pillow under abdomen Seated with 4# ankle weights:  heel/toe raises, LAQ, marching, hip abduction scissors.  BLE x15   07/16/2022:  Performed one set of HEP (see below)   PATIENT EDUCATION:  Education details: Issued HEP Person educated: Patient Education method: Explanation, Demonstration, and Handouts Education comprehension: verbalized understanding and returned demonstration   HOME EXERCISE PROGRAM: Access Code: [email or web address] URL: [email or web address] Date: 07/25/2022 Prepared by: Mikey Kirschner  Exercises - Supine Lower Trunk Rotation  - 1 x daily - 7 x weekly - 1 sets - 5 reps - 10 sec hold - Supine Transversus Abdominis Bracing - Hands on Stomach  - 1 x daily - 7 x weekly - 2 sets - 10 reps - Supine Piriformis Stretch with Foot on Ground  - 1 x daily - 7 x weekly - 1 sets - 2 reps - 20 sec hold - Supine Hamstring Stretch with Strap  - 1 x daily - 7 x weekly - 1 sets - 2 reps - 20 sec hold - Supine Single Knee to Chest Stretch  - 1 x daily - 7 x weekly - 1 sets - 3 reps - 20 sec hold - Seated Toe Raise  - 1 x daily - 7 x weekly - 2 sets - 10 reps - Heel Toe  Raises with Counter Support  - 1 x daily - 7 x weekly - 2  sets - 10 reps - Seated Ankle Eversion with Resistance  - 1 x daily - 7 x weekly - 2 sets - 10 reps - Seated Ankle Inversion with Resistance and Legs Crossed  - 1 x daily - 7 x weekly - 2 sets - 10 reps - Ankle Dorsiflexion with Resistance  - 1 x daily - 7 x weekly - 2 sets - 10 reps - Supine Posterior Pelvic Tilt  - 1 x daily - 7 x weekly - 3 sets - 10 reps - Supine 90/90 Alternating Heel Touches with Posterior Pelvic Tilt  - 1 x daily - 7 x weekly - 3 sets - 10 repsAccess Code: [email or web address] URL: [email or web address] Date: 07/16/2022 Prepared by: Clydie BraunShaunda Menke  Exercises - Supine Lower Trunk Rotation  - 1 x daily - 7 x weekly - 1 sets - 5 reps - 10 sec hold - Supine Transversus Abdominis Bracing - Hands on Stomach  - 1 x daily - 7 x weekly - 2 sets - 10 reps - Supine Piriformis Stretch with Foot on Ground  - 1 x daily - 7 x weekly - 1 sets - 2 reps - 20 sec hold - Supine Hamstring Stretch with Strap  - 1 x daily - 7 x weekly - 1 sets - 2 reps - 20 sec hold - Supine Single Knee to Chest Stretch  - 1 x daily - 7 x weekly - 1 sets - 3 reps - 20 sec hold - Seated Toe Raise  - 1 x daily - 7 x weekly - 2 sets - 10 reps - Heel Toe Raises with Counter Support  - 1 x daily - 7 x weekly - 2 sets - 10 reps  ASSESSMENT:  CLINICAL IMPRESSION: Elijah Birkom is able to tolerate higher level core work if he focuses on maintaining pelvic tilt.  He is diligent with his HEP.  He did very will with his ankle exercises today only fatiguing slightly with DF and Eversion.   Pt continues to require skilled PT to progress towards goal related activities.   OBJECTIVE IMPAIRMENTS decreased balance, difficulty walking, decreased strength, impaired flexibility, postural dysfunction, and pain.   ACTIVITY LIMITATIONS locomotion level  PARTICIPATION LIMITATIONS: community activity  PERSONAL FACTORS 1 comorbidity: lumbar spondylolisthesis  are also affecting patient's functional outcome.   REHAB POTENTIAL:  Good  CLINICAL DECISION MAKING: Evolving/moderate complexity  EVALUATION COMPLEXITY: Moderate   GOALS: Goals reviewed with patient? Yes  SHORT TERM GOALS: Target date: 08/06/2022  Pt will be independent with initial HEP. Baseline: Goal status: IN PROGRESS  2.  Pt will be able to perform heel walking in clinic with UE support. Baseline:  Goal status: INITIAL    LONG TERM GOALS: Target date: 09/07/2022  Pt will be independent with advanced HEP. Baseline:  Goal status: INITIAL  2.  Pt will be able to maintain right single leg stance for at least 30 sec and LLE for at least 15 sec to decrease his risk of falling. Baseline: 19 sec RLE, 1.8 sec LLE Goal status: INITIAL  3.  Pt will increase LEFS to at least 90% to demonstrate improvement in functional mobility. Baseline: 80% Goal status: INITIAL  4.  Pt will increase bilateral dorsiflexion strength to at least 4+/5 to allow him to ambulate with decreased foot drop. Baseline:  Goal status: INITIAL  5.  Pt will report ability to play golf with no immediate increase  in back pain. Baseline:  Goal status: INITIAL    PLAN: PT FREQUENCY: 2x/week  PT DURATION: 8 weeks  PLANNED INTERVENTIONS: Therapeutic exercises, Therapeutic activity, Neuromuscular re-education, Balance training, Gait training, Patient/Family education, Self Care, Joint mobilization, Joint manipulation, Stair training, Aquatic Therapy, Dry Needling, Electrical stimulation, Spinal manipulation, Spinal mobilization, Cryotherapy, Moist heat, Taping, Vasopneumatic device, Traction, Ultrasound, Ionotophoresis 4mg /ml Dexamethasone, Manual therapy, and Re-evaluation.  PLAN FOR NEXT SESSION: Progress HEP as indicated, strengthening, core stability, flexibility   Donovon Micheletti B. Ricarda Atayde, PT 07/25/22 3:25 PM   [facility] [address], [address] [place], [place] Phone # [phone] Fax [phone]

## 2022-08-06 ENCOUNTER — Encounter: Payer: Self-pay | Admitting: Rehabilitative and Restorative Service Providers"

## 2022-08-06 ENCOUNTER — Ambulatory Visit: Payer: Medicare PPO | Attending: Neurological Surgery | Admitting: Rehabilitative and Restorative Service Providers"

## 2022-08-06 DIAGNOSIS — M6281 Muscle weakness (generalized): Secondary | ICD-10-CM | POA: Diagnosis not present

## 2022-08-06 DIAGNOSIS — R2689 Other abnormalities of gait and mobility: Secondary | ICD-10-CM | POA: Insufficient documentation

## 2022-08-06 DIAGNOSIS — R262 Difficulty in walking, not elsewhere classified: Secondary | ICD-10-CM | POA: Insufficient documentation

## 2022-08-06 DIAGNOSIS — R252 Cramp and spasm: Secondary | ICD-10-CM | POA: Diagnosis not present

## 2022-08-06 DIAGNOSIS — M5459 Other low back pain: Secondary | ICD-10-CM | POA: Diagnosis not present

## 2022-08-06 NOTE — Therapy (Signed)
OUTPATIENT PHYSICAL THERAPY TREATMENT NOTE   Patient Name: Todd Kane MRN: [id number] DOB:25-Aug-1956, 66 y.o., male Today's Date: 08/06/2022   PT End of Session - 08/06/22 1101     Visit Number 4    Date for PT Re-Evaluation 09/07/22    Authorization Type [employer]    Authorization Time Period 07/16/22 - 09/07/2022    Authorization - Visit Number 4    Authorization - Number of Visits 16    Progress Note Due on Visit 10    PT Start Time 1100    PT Stop Time 1140    PT Time Calculation (min) 40 min    Activity Tolerance Patient tolerated treatment well    Behavior During Therapy [facility] for tasks assessed/performed             Past Medical History:  Diagnosis Date   Back pain    BPH (benign prostatic hyperplasia)    CAD (coronary artery disease)    a. mild-mod by cardiac CT 03/2017.   Chest pain    Hyperlipidemia    Hypertension    OSA (obstructive sleep apnea)    Pre-diabetes    Spondylolisthesis    Past Surgical History:  Procedure Laterality Date   MOUTH SURGERY  1982   3rd molars   TONSILLECTOMY AND ADENOIDECTOMY  1959   Patient Active Problem List   Diagnosis Date Noted   Pre-diabetes 08/03/2020   Coronary artery calcification seen on CAT scan 01/14/2018   Family history of early CAD 01/14/2018   Chest pain 01/29/2017   Chest pain of uncertain etiology 16/55/3748   Dyspnea on exertion 12/23/2014   Sciatica of right side 12/23/2014   Essential hypertension 12/23/2014   Hypercholesterolemia 12/23/2014    PCP: London Pepper, MD  REFERRING PROVIDER: Eustace Moore, MD   REFERRING DIAG: [phone] (ICD-10-CM) - Foot drop, left foot   Rationale for Evaluation and Treatment Rehabilitation  THERAPY DIAG:  Other abnormalities of gait and mobility  Muscle weakness (generalized)  Other low back pain  Cramp and spasm  ONSET DATE: Reports history of back pain, but foot drop started approx 4 weeks ago  SUBJECTIVE:                                                                                                                                                                                            SUBJECTIVE STATEMENT: Pt reports about the same.    PERTINENT HISTORY:  Lumbar Spondylolisthesis  PAIN:  Are you having pain? Yes: NPRS scale: 4-5/10 Pain location: low back Pain description: ache Aggravating factors: standing for prolonged periods and slow walks Relieving factors:  sitting   PRECAUTIONS: None  WEIGHT BEARING RESTRICTIONS No  FALLS:  Has patient fallen in last 6 months? No  LIVING ENVIRONMENT: Lives with: lives with their spouse Lives in: House/apartment Stairs: Yes: Internal: 14 steps; on right going up and on left going up and External: 3 steps; on right going up Has following equipment at home: None  OCCUPATION: Retired Therapist, art for 30 years  PLOF: Independent and Leisure: golfing  PATIENT GOALS:  To avoid surgery and walk without foot drop.   OBJECTIVE:   DIAGNOSTIC FINDINGS:  Reports MRI of back, pt reports that he has lumbar spondylolisthesis of L3-5  PATIENT SURVEYS:  07/16/2022:  LEFS 64 / 80 = 80.0 %  SENSATION: Reports some numbness at times down left leg primarily, but at times down right leg  MUSCLE LENGTH: Tightness bilateral hamstrings (Left 43 deg, Right 52 deg)  POSTURE: rounded shoulders, forward head, and decreased lumbar lordosis  PALPATION: Tenderness to palpation along lumbar region with muscle spasms noted along lumbar multifidi  LUMBAR ROM:  WFL with some pain at times  LOWER EXTREMITY MMT:    07/16/2022: Right hip and knee strength 5/5 Right ankle dorsiflexion 4/5 Left hip 4+/5 Left dorsiflexion 3/5  FUNCTIONAL TESTS:  07/16/2022: Single leg stance:  right LE 19 sec, left LE 1.8 sec  GAIT: Distance walked: >200 ft Assistive device utilized: None Level of assistance: Complete Independence Comments: Pt with some instability noted with ankles and decreased ankle  dorsiflexion    TODAY'S TREATMENT: 08/06/2022: Nustep level 5 x6 min with PT present to discuss status 4 way ankle strengthening with red tband 2x10 bilat Hooklying posterior pelvic tilt (PPT) 2x10 PPT with 90/90 heel tap x 20 Supine piriformis stretch 2x20 sec bilat Supine hamstring stretch with strap 2x20 sec bilat Sidelying clamshell with red tband 2x10 bilat Hoodklying PPT with clamshells with red tband 2x10 Bridging with ball squeeze 2x10 Prone on elbows with pillow under abdomen x1 min Prone press ups 2x10 Childs pose 2x20 sec Heel walking at barre 4x10 ft    07/25/2022: BAPS board: DF/PF, inv/ev, circle cw, circles ccw x 20 each Supine posterior pelvic tilt 2x10 (needed mod verbal cues for correct technique but no pain today) PPT x 20 PPT with 90/90 heel tap x 20 PPT with dying bug x 20 PPT with physio ball pass x 20 4 way ankle tband (red) x 20 each (therapist holding band for isolated DF, PF, inv, and ev) Lower trunk rotation with smaller range of motion 2x10 Supine hamstring stretch with strap 2x20 sec bilat  07/18/2022: Nustep level 4 LE only x7 minutes with PT present to discuss status Supine posterior pelvic tilt 2x10 (pt reports some pain) Supine SKTC 2x20 sec bilat Supine piriformis stretch 2x20 sec bilat Hooklying TA activation with pressing red pball into thighs 2x10 Lower trunk rotation with smaller range of motion 2x10 Supine hamstring stretch with strap 2x20 sec bilat Prone on elbows with pillow under abdomen Seated with 4# ankle weights:  heel/toe raises, LAQ, marching, hip abduction scissors.  BLE x15   07/16/2022:  Performed one set of HEP (see below)   PATIENT EDUCATION:  Education details: Issued HEP Person educated: Patient Education method: Explanation, Demonstration, and Handouts Education comprehension: verbalized understanding and returned demonstration   HOME EXERCISE PROGRAM: Access Code: [email or web address] URL:  [email or web address] Date: 07/25/2022 Prepared by: Candyce Churn  Exercises - Supine Lower Trunk Rotation  - 1 x daily - 7 x weekly - 1 sets - 5 reps - 10  sec hold - Supine Transversus Abdominis Bracing - Hands on Stomach  - 1 x daily - 7 x weekly - 2 sets - 10 reps - Supine Piriformis Stretch with Foot on Ground  - 1 x daily - 7 x weekly - 1 sets - 2 reps - 20 sec hold - Supine Hamstring Stretch with Strap  - 1 x daily - 7 x weekly - 1 sets - 2 reps - 20 sec hold - Supine Single Knee to Chest Stretch  - 1 x daily - 7 x weekly - 1 sets - 3 reps - 20 sec hold - Seated Toe Raise  - 1 x daily - 7 x weekly - 2 sets - 10 reps - Heel Toe Raises with Counter Support  - 1 x daily - 7 x weekly - 2 sets - 10 reps - Seated Ankle Eversion with Resistance  - 1 x daily - 7 x weekly - 2 sets - 10 reps - Seated Ankle Inversion with Resistance and Legs Crossed  - 1 x daily - 7 x weekly - 2 sets - 10 reps - Ankle Dorsiflexion with Resistance  - 1 x daily - 7 x weekly - 2 sets - 10 reps - Supine Posterior Pelvic Tilt  - 1 x daily - 7 x weekly - 3 sets - 10 reps - Supine 90/90 Alternating Heel Touches with Posterior Pelvic Tilt  - 1 x daily - 7 x weekly - 3 sets - 10 reps   ASSESSMENT:  CLINICAL IMPRESSION: Mr Luther Parody presents to skilled PT reporting no real progress towards decreased foot drop.  Pt has an appointment with Dr Ronnald Ramp for a surgery consult on Thursday.  Pt is progressing with core stability exercises during session.  Pt denies any centralization of symptoms with extension.  Pt continues to progress towards goal related activities.   OBJECTIVE IMPAIRMENTS decreased balance, difficulty walking, decreased strength, impaired flexibility, postural dysfunction, and pain.   ACTIVITY LIMITATIONS locomotion level  PARTICIPATION LIMITATIONS: community activity  PERSONAL FACTORS 1 comorbidity: lumbar spondylolisthesis  are also affecting patient's functional outcome.   REHAB  POTENTIAL: Good  CLINICAL DECISION MAKING: Evolving/moderate complexity  EVALUATION COMPLEXITY: Moderate   GOALS: Goals reviewed with patient? Yes  SHORT TERM GOALS: Target date: 08/06/2022  Pt will be independent with initial HEP. Baseline: Goal status: MET on 08/06/2022  2.  Pt will be able to perform heel walking in clinic with UE support. Baseline:  Goal status: IN PROGRESS    LONG TERM GOALS: Target date: 09/07/2022  Pt will be independent with advanced HEP. Baseline:  Goal status: INITIAL  2.  Pt will be able to maintain right single leg stance for at least 30 sec and LLE for at least 15 sec to decrease his risk of falling. Baseline: 19 sec RLE, 1.8 sec LLE Goal status: INITIAL  3.  Pt will increase LEFS to at least 90% to demonstrate improvement in functional mobility. Baseline: 80% Goal status: INITIAL  4.  Pt will increase bilateral dorsiflexion strength to at least 4+/5 to allow him to ambulate with decreased foot drop. Baseline:  Goal status: INITIAL  5.  Pt will report ability to play golf with no immediate increase in back pain. Baseline:  Goal status: INITIAL    PLAN: PT FREQUENCY: 2x/week  PT DURATION: 8 weeks  PLANNED INTERVENTIONS: Therapeutic exercises, Therapeutic activity, Neuromuscular re-education, Balance training, Gait training, Patient/Family education, Self Care, Joint mobilization, Joint manipulation, Stair training, Aquatic Therapy, Dry Needling, Electrical stimulation,  Spinal manipulation, Spinal mobilization, Cryotherapy, Moist heat, Taping, Vasopneumatic device, Traction, Ultrasound, Ionotophoresis 4mg /ml Dexamethasone, Manual therapy, and Re-evaluation.  PLAN FOR NEXT SESSION: Progress HEP as indicated, strengthening, core stability, flexibility   Juel Burrow, PT 08/06/22 11:44 AM   [facility] [address] [place], [place] [address] Phone # [phone] Fax [phone]

## 2022-08-08 ENCOUNTER — Encounter: Payer: Self-pay | Admitting: Rehabilitative and Restorative Service Providers"

## 2022-08-08 ENCOUNTER — Ambulatory Visit: Payer: Medicare PPO | Admitting: Rehabilitative and Restorative Service Providers"

## 2022-08-08 DIAGNOSIS — M5459 Other low back pain: Secondary | ICD-10-CM | POA: Diagnosis not present

## 2022-08-08 DIAGNOSIS — R2689 Other abnormalities of gait and mobility: Secondary | ICD-10-CM | POA: Diagnosis not present

## 2022-08-08 DIAGNOSIS — R252 Cramp and spasm: Secondary | ICD-10-CM

## 2022-08-08 DIAGNOSIS — R262 Difficulty in walking, not elsewhere classified: Secondary | ICD-10-CM | POA: Diagnosis not present

## 2022-08-08 DIAGNOSIS — M6281 Muscle weakness (generalized): Secondary | ICD-10-CM | POA: Diagnosis not present

## 2022-08-08 NOTE — Therapy (Signed)
OUTPATIENT PHYSICAL THERAPY TREATMENT NOTE   Patient Name: Todd Kane MRN: [id number] DOB:11-02-1956, 66 y.o., male Today's Date: 08/08/2022   PT End of Session - 08/08/22 1059     Visit Number 5    Date for PT Re-Evaluation 09/07/22    Authorization Type [employer]    Authorization Time Period 07/16/22 - 09/07/2022    Authorization - Visit Number 5    Authorization - Number of Visits 16    Progress Note Due on Visit 10    PT Start Time 1057    PT Stop Time 1135    PT Time Calculation (min) 38 min    Activity Tolerance Patient tolerated treatment well    Behavior During Therapy [facility] for tasks assessed/performed             Past Medical History:  Diagnosis Date   Back pain    BPH (benign prostatic hyperplasia)    CAD (coronary artery disease)    a. mild-mod by cardiac CT 03/2017.   Chest pain    Hyperlipidemia    Hypertension    OSA (obstructive sleep apnea)    Pre-diabetes    Spondylolisthesis    Past Surgical History:  Procedure Laterality Date   MOUTH SURGERY  1982   3rd molars   TONSILLECTOMY AND ADENOIDECTOMY  1959   Patient Active Problem List   Diagnosis Date Noted   Pre-diabetes 08/03/2020   Coronary artery calcification seen on CAT scan 01/14/2018   Family history of early CAD 01/14/2018   Chest pain 01/29/2017   Chest pain of uncertain etiology 81/11/7508   Dyspnea on exertion 12/23/2014   Sciatica of right side 12/23/2014   Essential hypertension 12/23/2014   Hypercholesterolemia 12/23/2014    PCP: London Pepper, MD  REFERRING PROVIDER: Eustace Moore, MD   REFERRING DIAG: [phone] (ICD-10-CM) - Foot drop, left foot   Rationale for Evaluation and Treatment Rehabilitation  THERAPY DIAG:  Other abnormalities of gait and mobility  Muscle weakness (generalized)  Other low back pain  Cramp and spasm  ONSET DATE: Reports history of back pain, but foot drop started approx 4 weeks ago  SUBJECTIVE:                                                                                                                                                                                            SUBJECTIVE STATEMENT: Pt reports about the same, states that he got his Covid vaccine yesterday.    PERTINENT HISTORY:  Lumbar Spondylolisthesis  PAIN:  Are you having pain? Yes: NPRS scale: 1/10 Pain location: low back Pain description: ache Aggravating factors: standing  for prolonged periods and slow walks Relieving factors: sitting   PRECAUTIONS: None  WEIGHT BEARING RESTRICTIONS No  FALLS:  Has patient fallen in last 6 months? No  LIVING ENVIRONMENT: Lives with: lives with their spouse Lives in: House/apartment Stairs: Yes: Internal: 14 steps; on right going up and on left going up and External: 3 steps; on right going up Has following equipment at home: None  OCCUPATION: Retired Therapist, art for 30 years  PLOF: Independent and Leisure: golfing  PATIENT GOALS:  To avoid surgery and walk without foot drop.   OBJECTIVE:   DIAGNOSTIC FINDINGS:  Reports MRI of back, pt reports that he has lumbar spondylolisthesis of L3-5  PATIENT SURVEYS:  07/16/2022:  LEFS 64 / 80 = 80.0 %  SENSATION: Reports some numbness at times down left leg primarily, but at times down right leg  MUSCLE LENGTH: Tightness bilateral hamstrings (Left 43 deg, Right 52 deg)  POSTURE: rounded shoulders, forward head, and decreased lumbar lordosis  PALPATION: Tenderness to palpation along lumbar region with muscle spasms noted along lumbar multifidi  LUMBAR ROM:  WFL with some pain at times  LOWER EXTREMITY MMT:    07/16/2022: Right hip and knee strength 5/5 Right ankle dorsiflexion 4/5 Left hip 4+/5 Left dorsiflexion 3/5  FUNCTIONAL TESTS:  07/16/2022: Single leg stance:  right LE 19 sec, left LE 1.8 sec  08/08/2022: Single leg stance:  right LE 24 sec, left LE 4 sec on first attempt and 8 sec on second attempt LLE single leg stance  following e-stim:  14.8 sec  GAIT: Distance walked: >200 ft Assistive device utilized: None Level of assistance: Complete Independence Comments: Pt with some instability noted with ankles and decreased ankle dorsiflexion    TODAY'S TREATMENT: 08/08/2022: Nustep level 5 x6 min with PT present to discuss status Single leg stance 4 way ankle strengthening with red tband 2x10 bilat [place] e-stim to left anterior tib:  Cycle time 10/30, Duty Cycle 50%, Ramp 2 sec.  Treatment time 10 min.  Adjusted intensity to allow for muscle contraction. Heel walking at barre 2x10 ft Ambulation down PT hallway and back Standing toe raises 3x10 Standing squats 2x10   08/06/2022: Nustep level 5 x6 min with PT present to discuss status 4 way ankle strengthening with red tband 2x10 bilat Hooklying posterior pelvic tilt (PPT) 2x10 PPT with 90/90 heel tap x 20 Supine piriformis stretch 2x20 sec bilat Supine hamstring stretch with strap 2x20 sec bilat Sidelying clamshell with red tband 2x10 bilat Hoodklying PPT with clamshells with red tband 2x10 Bridging with ball squeeze 2x10 Prone on elbows with pillow under abdomen x1 min Prone press ups 2x10 Childs pose 2x20 sec Heel walking at barre 4x10 ft    07/25/2022: BAPS board: DF/PF, inv/ev, circle cw, circles ccw x 20 each Supine posterior pelvic tilt 2x10 (needed mod verbal cues for correct technique but no pain today) PPT x 20 PPT with 90/90 heel tap x 20 PPT with dying bug x 20 PPT with physio ball pass x 20 4 way ankle tband (red) x 20 each (therapist holding band for isolated DF, PF, inv, and ev) Lower trunk rotation with smaller range of motion 2x10 Supine hamstring stretch with strap 2x20 sec bilat    PATIENT EDUCATION:  Education details: Issued HEP Person educated: Patient Education method: Explanation, Demonstration, and Handouts Education comprehension: verbalized understanding and returned demonstration   HOME EXERCISE  PROGRAM: Access Code: [email or web address] URL: [email or web address] Date: 07/25/2022 Prepared by: Candyce Churn  Exercises - Supine Lower  Trunk Rotation  - 1 x daily - 7 x weekly - 1 sets - 5 reps - 10 sec hold - Supine Transversus Abdominis Bracing - Hands on Stomach  - 1 x daily - 7 x weekly - 2 sets - 10 reps - Supine Piriformis Stretch with Foot on Ground  - 1 x daily - 7 x weekly - 1 sets - 2 reps - 20 sec hold - Supine Hamstring Stretch with Strap  - 1 x daily - 7 x weekly - 1 sets - 2 reps - 20 sec hold - Supine Single Knee to Chest Stretch  - 1 x daily - 7 x weekly - 1 sets - 3 reps - 20 sec hold - Seated Toe Raise  - 1 x daily - 7 x weekly - 2 sets - 10 reps - Heel Toe Raises with Counter Support  - 1 x daily - 7 x weekly - 2 sets - 10 reps - Seated Ankle Eversion with Resistance  - 1 x daily - 7 x weekly - 2 sets - 10 reps - Seated Ankle Inversion with Resistance and Legs Crossed  - 1 x daily - 7 x weekly - 2 sets - 10 reps - Ankle Dorsiflexion with Resistance  - 1 x daily - 7 x weekly - 2 sets - 10 reps - Supine Posterior Pelvic Tilt  - 1 x daily - 7 x weekly - 3 sets - 10 reps - Supine 90/90 Alternating Heel Touches with Posterior Pelvic Tilt  - 1 x daily - 7 x weekly - 3 sets - 10 reps   ASSESSMENT:  CLINICAL IMPRESSION: Mr Luther Parody presents to skilled PT reporting that he goes to see Dr Ronnald Ramp tomorrow, but denies any progress.  Pt educated about [place] e-stim current to assist with muscle fiber recruitment.  Pt able to tolerate [place] e-stim and able to illicit a muscle contraction.  Following e-stim, pt with decreased foot drop noted and improved time with single leg stance on LLE.   Pt will follow up next visit to notify how long benefits from e-stim lasted and recommendations from Dr Ronnald Ramp.   OBJECTIVE IMPAIRMENTS decreased balance, difficulty walking, decreased strength, impaired flexibility, postural dysfunction, and pain.   ACTIVITY LIMITATIONS locomotion  level  PARTICIPATION LIMITATIONS: community activity  PERSONAL FACTORS 1 comorbidity: lumbar spondylolisthesis  are also affecting patient's functional outcome.   REHAB POTENTIAL: Good  CLINICAL DECISION MAKING: Evolving/moderate complexity  EVALUATION COMPLEXITY: Moderate   GOALS: Goals reviewed with patient? Yes  SHORT TERM GOALS: Target date: 08/06/2022  Pt will be independent with initial HEP. Baseline: Goal status: MET on 08/06/2022  2.  Pt will be able to perform heel walking in clinic with UE support. Baseline:  Goal status: MET on 08/08/2022 following e-stim    LONG TERM GOALS: Target date: 09/07/2022  Pt will be independent with advanced HEP. Baseline:  Goal status: INITIAL  2.  Pt will be able to maintain right single leg stance for at least 30 sec and LLE for at least 15 sec to decrease his risk of falling. Baseline: 19 sec RLE, 1.8 sec LLE Goal status: INITIAL  3.  Pt will increase LEFS to at least 90% to demonstrate improvement in functional mobility. Baseline: 80% Goal status: INITIAL  4.  Pt will increase bilateral dorsiflexion strength to at least 4+/5 to allow him to ambulate with decreased foot drop. Baseline:  Goal status: INITIAL  5.  Pt will report ability to play golf with no  immediate increase in back pain. Baseline:  Goal status: INITIAL    PLAN: PT FREQUENCY: 2x/week  PT DURATION: 8 weeks  PLANNED INTERVENTIONS: Therapeutic exercises, Therapeutic activity, Neuromuscular re-education, Balance training, Gait training, Patient/Family education, Self Care, Joint mobilization, Joint manipulation, Stair training, Aquatic Therapy, Dry Needling, Electrical stimulation, Spinal manipulation, Spinal mobilization, Cryotherapy, Moist heat, Taping, Vasopneumatic device, Traction, Ultrasound, Ionotophoresis 7m/ml Dexamethasone, Manual therapy, and Re-evaluation.  PLAN FOR NEXT SESSION: Assess response to e-stim, Progress HEP as indicated,  strengthening, core stability, flexibility   SJuel Burrow PT 08/08/22 11:44 AM   [facility] [address] [place] [address]Phone # [phone] [phone]

## 2022-08-09 DIAGNOSIS — M21372 Foot drop, left foot: Secondary | ICD-10-CM | POA: Diagnosis not present

## 2022-08-09 DIAGNOSIS — Z6828 Body mass index (BMI) 28.0-28.9, adult: Secondary | ICD-10-CM | POA: Diagnosis not present

## 2022-08-13 ENCOUNTER — Ambulatory Visit: Payer: Medicare PPO | Admitting: Rehabilitative and Restorative Service Providers"

## 2022-08-13 ENCOUNTER — Encounter: Payer: Self-pay | Admitting: Rehabilitative and Restorative Service Providers"

## 2022-08-13 DIAGNOSIS — M6281 Muscle weakness (generalized): Secondary | ICD-10-CM | POA: Diagnosis not present

## 2022-08-13 DIAGNOSIS — R2689 Other abnormalities of gait and mobility: Secondary | ICD-10-CM

## 2022-08-13 DIAGNOSIS — R252 Cramp and spasm: Secondary | ICD-10-CM

## 2022-08-13 DIAGNOSIS — R262 Difficulty in walking, not elsewhere classified: Secondary | ICD-10-CM | POA: Diagnosis not present

## 2022-08-13 DIAGNOSIS — M5459 Other low back pain: Secondary | ICD-10-CM | POA: Diagnosis not present

## 2022-08-13 NOTE — Therapy (Signed)
OUTPATIENT PHYSICAL THERAPY TREATMENT NOTE   Patient Name: Todd Kane MRN: [id number] DOB:06/14/1956, 66 y.o., male Today's Date: 08/13/2022   PT End of Session - 08/13/22 0850     Visit Number 6    Date for PT Re-Evaluation 09/07/22    Authorization Type [employer]    Authorization Time Period 07/16/22 - 09/07/2022    Authorization - Visit Number 6    Authorization - Number of Visits 16    Progress Note Due on Visit 10    PT Start Time 0845    PT Stop Time 0925    PT Time Calculation (min) 40 min    Activity Tolerance Patient tolerated treatment well    Behavior During Therapy [facility] for tasks assessed/performed             Past Medical History:  Diagnosis Date   Back pain    BPH (benign prostatic hyperplasia)    CAD (coronary artery disease)    a. mild-mod by cardiac CT 03/2017.   Chest pain    Hyperlipidemia    Hypertension    OSA (obstructive sleep apnea)    Pre-diabetes    Spondylolisthesis    Past Surgical History:  Procedure Laterality Date   MOUTH SURGERY  1982   3rd molars   TONSILLECTOMY AND ADENOIDECTOMY  1959   Patient Active Problem List   Diagnosis Date Noted   Pre-diabetes 08/03/2020   Coronary artery calcification seen on CAT scan 01/14/2018   Family history of early CAD 01/14/2018   Chest pain 01/29/2017   Chest pain of uncertain etiology 00/93/8182   Dyspnea on exertion 12/23/2014   Sciatica of right side 12/23/2014   Essential hypertension 12/23/2014   Hypercholesterolemia 12/23/2014    PCP: London Pepper, MD  REFERRING PROVIDER: Eustace Moore, MD   REFERRING DIAG: [phone] (ICD-10-CM) - Foot drop, left foot   Rationale for Evaluation and Treatment Rehabilitation  THERAPY DIAG:  Other abnormalities of gait and mobility  Muscle weakness (generalized)  Other low back pain  Cramp and spasm  ONSET DATE: Reports history of back pain, but foot drop started approx 4 weeks ago  SUBJECTIVE:                                                                                                                                                                                            SUBJECTIVE STATEMENT: Pt reports that he had some lasting response with the e-stim last session.  Reports that Dr Ronnald Ramp wants to conduct and EMG prior to pursuing surgery option.  PERTINENT HISTORY:  Lumbar Spondylolisthesis  PAIN:  Are you having pain? Yes:  NPRS scale: 0-1/10 Pain location: low back Pain description: ache Aggravating factors: standing for prolonged periods and slow walks Relieving factors: sitting   PRECAUTIONS: None  WEIGHT BEARING RESTRICTIONS No  FALLS:  Has patient fallen in last 6 months? No  LIVING ENVIRONMENT: Lives with: lives with their spouse Lives in: House/apartment Stairs: Yes: Internal: 14 steps; on right going up and on left going up and External: 3 steps; on right going up Has following equipment at home: None  OCCUPATION: Retired Therapist, art for 30 years  PLOF: Independent and Leisure: golfing  PATIENT GOALS:  To avoid surgery and walk without foot drop.   OBJECTIVE:   DIAGNOSTIC FINDINGS:  Reports MRI of back, pt reports that he has lumbar spondylolisthesis of L3-5  PATIENT SURVEYS:  07/16/2022:  LEFS 64 / 80 = 80.0 %  SENSATION: Reports some numbness at times down left leg primarily, but at times down right leg  MUSCLE LENGTH: Tightness bilateral hamstrings (Left 43 deg, Right 52 deg)  POSTURE: rounded shoulders, forward head, and decreased lumbar lordosis  PALPATION: Tenderness to palpation along lumbar region with muscle spasms noted along lumbar multifidi  LUMBAR ROM:  WFL with some pain at times  LOWER EXTREMITY MMT:    07/16/2022: Right hip and knee strength 5/5 Right ankle dorsiflexion 4/5 Left hip 4+/5 Left dorsiflexion 3/5  FUNCTIONAL TESTS:  07/16/2022: Single leg stance:  right LE 19 sec, left LE 1.8 sec  08/08/2022: Single leg stance:  right LE 24 sec, left LE  4 sec on first attempt and 8 sec on second attempt LLE single leg stance following e-stim:  14.8 sec  GAIT: Distance walked: >200 ft Assistive device utilized: None Level of assistance: Complete Independence Comments: Pt with some instability noted with ankles and decreased ankle dorsiflexion    TODAY'S TREATMENT: 08/13/2022: Nustep level 6 x7 min with PT present to discuss status [place] e-stim to left anterior tib:  Cycle time 10/30, Duty Cycle 50%, Ramp 2 sec.  Treatment time 10 min.  Adjusted intensity to allow for muscle contraction 4 way ankle strengthening with red tband 2x10 LLE Heel walking at barre 4x10 ft Single leg stance Wall slides/squats 2x10 Rocker board x1 min   08/08/2022: Nustep level 5 x6 min with PT present to discuss status Single leg stance 4 way ankle strengthening with red tband 2x10 bilat [place] e-stim to left anterior tib:  Cycle time 10/30, Duty Cycle 50%, Ramp 2 sec.  Treatment time 10 min.  Adjusted intensity to allow for muscle contraction.  PT present performing constant assessments and adjustments throughout. Heel walking at barre 2x10 ft Ambulation down PT hallway and back Standing toe raises 3x10 Standing squats 2x10   08/06/2022: Nustep level 5 x6 min with PT present to discuss status 4 way ankle strengthening with red tband 2x10 bilat Hooklying posterior pelvic tilt (PPT) 2x10 PPT with 90/90 heel tap x 20 Supine piriformis stretch 2x20 sec bilat Supine hamstring stretch with strap 2x20 sec bilat Sidelying clamshell with red tband 2x10 bilat Hoodklying PPT with clamshells with red tband 2x10 Bridging with ball squeeze 2x10 Prone on elbows with pillow under abdomen x1 min Prone press ups 2x10 Childs pose 2x20 sec Heel walking at barre 4x10 ft     PATIENT EDUCATION:  Education details: Issued HEP Person educated: Patient Education method: Consulting civil engineer, Media planner, and Handouts Education comprehension: verbalized understanding  and returned demonstration   HOME EXERCISE PROGRAM: Access Code: [email or web address] URL: [email or web address] Date: 07/25/2022 Prepared by: Candyce Churn  Exercises -  Supine Lower Trunk Rotation  - 1 x daily - 7 x weekly - 1 sets - 5 reps - 10 sec hold - Supine Transversus Abdominis Bracing - Hands on Stomach  - 1 x daily - 7 x weekly - 2 sets - 10 reps - Supine Piriformis Stretch with Foot on Ground  - 1 x daily - 7 x weekly - 1 sets - 2 reps - 20 sec hold - Supine Hamstring Stretch with Strap  - 1 x daily - 7 x weekly - 1 sets - 2 reps - 20 sec hold - Supine Single Knee to Chest Stretch  - 1 x daily - 7 x weekly - 1 sets - 3 reps - 20 sec hold - Seated Toe Raise  - 1 x daily - 7 x weekly - 2 sets - 10 reps - Heel Toe Raises with Counter Support  - 1 x daily - 7 x weekly - 2 sets - 10 reps - Seated Ankle Eversion with Resistance  - 1 x daily - 7 x weekly - 2 sets - 10 reps - Seated Ankle Inversion with Resistance and Legs Crossed  - 1 x daily - 7 x weekly - 2 sets - 10 reps - Ankle Dorsiflexion with Resistance  - 1 x daily - 7 x weekly - 2 sets - 10 reps - Supine Posterior Pelvic Tilt  - 1 x daily - 7 x weekly - 3 sets - 10 reps - Supine 90/90 Alternating Heel Touches with Posterior Pelvic Tilt  - 1 x daily - 7 x weekly - 3 sets - 10 reps   ASSESSMENT:  CLINICAL IMPRESSION: Todd Kane presents to skilled PT reporting that he will be undergoing an EMG in the future and Dr Ronnald Ramp does not want to pursue surgical intervention at this time.  Pt continues to tolerate [place] e-stim with his left anterior tibialis.  During e-stim, had pt perform ankle pumps when the current was turned off, then had pt perform dorsiflexion during contraction.  Pt continues to require skilled PT to progress towards goal related activities and improved left dorsiflexion.   OBJECTIVE IMPAIRMENTS decreased balance, difficulty walking, decreased strength, impaired flexibility, postural dysfunction, and  pain.   ACTIVITY LIMITATIONS locomotion level  PARTICIPATION LIMITATIONS: community activity  PERSONAL FACTORS 1 comorbidity: lumbar spondylolisthesis  are also affecting patient's functional outcome.   REHAB POTENTIAL: Good  CLINICAL DECISION MAKING: Evolving/moderate complexity  EVALUATION COMPLEXITY: Moderate   GOALS: Goals reviewed with patient? Yes  SHORT TERM GOALS: Target date: 08/06/2022  Pt will be independent with initial HEP. Baseline: Goal status: MET on 08/06/2022  2.  Pt will be able to perform heel walking in clinic with UE support. Baseline:  Goal status: MET on 08/08/2022 following e-stim    LONG TERM GOALS: Target date: 09/07/2022  Pt will be independent with advanced HEP. Baseline:  Goal status: IN PROGRESS  2.  Pt will be able to maintain right single leg stance for at least 30 sec and LLE for at least 15 sec to decrease his risk of falling. Baseline: 19 sec RLE, 1.8 sec LLE Goal status: INITIAL  3.  Pt will increase LEFS to at least 90% to demonstrate improvement in functional mobility. Baseline: 80% Goal status: INITIAL  4.  Pt will increase bilateral dorsiflexion strength to at least 4+/5 to allow him to ambulate with decreased foot drop. Baseline:  Goal status: INITIAL  5.  Pt will report ability to play golf with no immediate increase  in back pain. Baseline:  Goal status: INITIAL    PLAN: PT FREQUENCY: 2x/week  PT DURATION: 8 weeks  PLANNED INTERVENTIONS: Therapeutic exercises, Therapeutic activity, Neuromuscular re-education, Balance training, Gait training, Patient/Family education, Self Care, Joint mobilization, Joint manipulation, Stair training, Aquatic Therapy, Dry Needling, Electrical stimulation, Spinal manipulation, Spinal mobilization, Cryotherapy, Moist heat, Taping, Vasopneumatic device, Traction, Ultrasound, Ionotophoresis 4mg /ml Dexamethasone, Manual therapy, and Re-evaluation.  PLAN FOR NEXT SESSION: Assess response to  e-stim, Progress HEP as indicated, strengthening, core stability, flexibility   Juel Burrow, PT 08/13/22 9:35 AM   [facility] [address] [place], [place] [address] Phone # [phone] Fax [phone]

## 2022-08-16 ENCOUNTER — Ambulatory Visit: Payer: Medicare PPO

## 2022-08-16 DIAGNOSIS — M6281 Muscle weakness (generalized): Secondary | ICD-10-CM | POA: Diagnosis not present

## 2022-08-16 DIAGNOSIS — R262 Difficulty in walking, not elsewhere classified: Secondary | ICD-10-CM | POA: Diagnosis not present

## 2022-08-16 DIAGNOSIS — M5459 Other low back pain: Secondary | ICD-10-CM | POA: Diagnosis not present

## 2022-08-16 DIAGNOSIS — R252 Cramp and spasm: Secondary | ICD-10-CM | POA: Diagnosis not present

## 2022-08-16 DIAGNOSIS — R2689 Other abnormalities of gait and mobility: Secondary | ICD-10-CM | POA: Diagnosis not present

## 2022-08-16 NOTE — Therapy (Signed)
OUTPATIENT PHYSICAL THERAPY TREATMENT NOTE   Patient Name: Todd Kane MRN: [id number] DOB:Nov 18, 1955, 66 y.o., male Today's Date: 08/16/2022   PT End of Session - 08/16/22 0846     Visit Number 7    Date for PT Re-Evaluation 09/07/22    Authorization Type [employer]    Authorization Time Period 07/16/22 - 09/07/2022    Authorization - Visit Number 7    Authorization - Number of Visits 16    Progress Note Due on Visit 10    PT Start Time 0847    PT Stop Time 0930    PT Time Calculation (min) 43 min    Activity Tolerance Patient tolerated treatment well    Behavior During Therapy [facility] for tasks assessed/performed             Past Medical History:  Diagnosis Date   Back pain    BPH (benign prostatic hyperplasia)    CAD (coronary artery disease)    a. mild-mod by cardiac CT 03/2017.   Chest pain    Hyperlipidemia    Hypertension    OSA (obstructive sleep apnea)    Pre-diabetes    Spondylolisthesis    Past Surgical History:  Procedure Laterality Date   MOUTH SURGERY  1982   3rd molars   TONSILLECTOMY AND ADENOIDECTOMY  1959   Patient Active Problem List   Diagnosis Date Noted   Pre-diabetes 08/03/2020   Coronary artery calcification seen on CAT scan 01/14/2018   Family history of early CAD 01/14/2018   Chest pain 01/29/2017   Chest pain of uncertain etiology 02/77/4128   Dyspnea on exertion 12/23/2014   Sciatica of right side 12/23/2014   Essential hypertension 12/23/2014   Hypercholesterolemia 12/23/2014    PCP: London Pepper, MD  REFERRING PROVIDER: Eustace Moore, MD   REFERRING DIAG: [phone] (ICD-10-CM) - Foot drop, left foot   Rationale for Evaluation and Treatment Rehabilitation  THERAPY DIAG:  Other abnormalities of gait and mobility  Muscle weakness (generalized)  Other low back pain  Cramp and spasm  ONSET DATE: Reports history of back pain, but foot drop started approx 4 weeks ago  SUBJECTIVE:                                                                                                                                                                                            SUBJECTIVE STATEMENT: No new complaints.  Good response to last session.    PERTINENT HISTORY:  Lumbar Spondylolisthesis  PAIN:  Are you having pain? Yes: NPRS scale: 0-1/10 Pain location: low back Pain description: ache Aggravating factors: standing for prolonged periods and  slow walks Relieving factors: sitting   PRECAUTIONS: None  WEIGHT BEARING RESTRICTIONS No  FALLS:  Has patient fallen in last 6 months? No  LIVING ENVIRONMENT: Lives with: lives with their spouse Lives in: House/apartment Stairs: Yes: Internal: 14 steps; on right going up and on left going up and External: 3 steps; on right going up Has following equipment at home: None  OCCUPATION: Retired Therapist, art for 30 years  PLOF: Independent and Leisure: golfing  PATIENT GOALS:  To avoid surgery and walk without foot drop.   OBJECTIVE:   DIAGNOSTIC FINDINGS:  Reports MRI of back, pt reports that he has lumbar spondylolisthesis of L3-5  PATIENT SURVEYS:  07/16/2022:  LEFS 64 / 80 = 80.0 %  SENSATION: Reports some numbness at times down left leg primarily, but at times down right leg  MUSCLE LENGTH: Tightness bilateral hamstrings (Left 43 deg, Right 52 deg)  POSTURE: rounded shoulders, forward head, and decreased lumbar lordosis  PALPATION: Tenderness to palpation along lumbar region with muscle spasms noted along lumbar multifidi  LUMBAR ROM:  WFL with some pain at times  LOWER EXTREMITY MMT:    07/16/2022: Right hip and knee strength 5/5 Right ankle dorsiflexion 4/5 Left hip 4+/5 Left dorsiflexion 3/5  FUNCTIONAL TESTS:  07/16/2022: Single leg stance:  right LE 19 sec, left LE 1.8 sec  08/08/2022: Single leg stance:  right LE 24 sec, left LE 4 sec on first attempt and 8 sec on second attempt LLE single leg stance following e-stim:  14.8  sec  GAIT: Distance walked: >200 ft Assistive device utilized: None Level of assistance: Complete Independence Comments: Pt with some instability noted with ankles and decreased ankle dorsiflexion    TODAY'S TREATMENT: 08/16/2022: Nustep level 6 x7 min with PT present to discuss status [place] e-stim to left anterior tib:  Cycle time 10/30, Duty Cycle 50%, Ramp 2 sec.  Treatment time 10 min.  Adjusted intensity to allow for muscle contraction  (Intensity today at 45 mA) 4 way ankle strengthening with red tband x 20 LLE Heel walking in room 4x10 ft Single leg stance 5 x attempting 10 sec hold Left Educated patient on how to use balance strategies: ankle, knee hip and core to improve balance skills Dynamic balance: cone touches  3 x 10 on left Wall slides/squats 2x10 in tandem (left foot in back)  08/13/2022: Nustep level 6 x7 min with PT present to discuss status [place] e-stim to left anterior tib:  Cycle time 10/30, Duty Cycle 50%, Ramp 2 sec.  Treatment time 10 min.  Adjusted intensity to allow for muscle contraction 4 way ankle strengthening with red tband 2x10 LLE Heel walking at barre 4x10 ft Single leg stance Wall slides/squats 2x10 Rocker board x1 min  08/08/2022: Nustep level 5 x6 min with PT present to discuss status Single leg stance 4 way ankle strengthening with red tband 2x10 bilat [place] e-stim to left anterior tib:  Cycle time 10/30, Duty Cycle 50%, Ramp 2 sec.  Treatment time 10 min.  Adjusted intensity to allow for muscle contraction.  PT present performing constant assessments and adjustments throughout. Heel walking at barre 2x10 ft Ambulation down PT hallway and back Standing toe raises 3x10 Standing squats 2x10  PATIENT EDUCATION:  Education details: Issued HEP Person educated: Patient Education method: Explanation, Demonstration, and Handouts Education comprehension: verbalized understanding and returned demonstration   HOME EXERCISE  PROGRAM: Access Code: [email or web address] URL: [email or web address] Date: 07/25/2022 Prepared by: Candyce Churn  Exercises - Supine Lower Trunk Rotation  -  1 x daily - 7 x weekly - 1 sets - 5 reps - 10 sec hold - Supine Transversus Abdominis Bracing - Hands on Stomach  - 1 x daily - 7 x weekly - 2 sets - 10 reps - Supine Piriformis Stretch with Foot on Ground  - 1 x daily - 7 x weekly - 1 sets - 2 reps - 20 sec hold - Supine Hamstring Stretch with Strap  - 1 x daily - 7 x weekly - 1 sets - 2 reps - 20 sec hold - Supine Single Knee to Chest Stretch  - 1 x daily - 7 x weekly - 1 sets - 3 reps - 20 sec hold - Seated Toe Raise  - 1 x daily - 7 x weekly - 2 sets - 10 reps - Heel Toe Raises with Counter Support  - 1 x daily - 7 x weekly - 2 sets - 10 reps - Seated Ankle Eversion with Resistance  - 1 x daily - 7 x weekly - 2 sets - 10 reps - Seated Ankle Inversion with Resistance and Legs Crossed  - 1 x daily - 7 x weekly - 2 sets - 10 reps - Ankle Dorsiflexion with Resistance  - 1 x daily - 7 x weekly - 2 sets - 10 reps - Supine Posterior Pelvic Tilt  - 1 x daily - 7 x weekly - 3 sets - 10 reps - Supine 90/90 Alternating Heel Touches with Posterior Pelvic Tilt  - 1 x daily - 7 x weekly - 3 sets - 10 reps   ASSESSMENT:  CLINICAL IMPRESSION: Mr Luther Parody is progressing appropriately.  He was able to maintain SLS for 8 sec on left and was able to do 6 cone taps before LOB.  He was able to return understanding of proper use of balance strategies at end of session.  He should continue to improve.    Pt continues to require skilled PT to progress towards goal related activities and improved left dorsiflexion.   OBJECTIVE IMPAIRMENTS decreased balance, difficulty walking, decreased strength, impaired flexibility, postural dysfunction, and pain.   ACTIVITY LIMITATIONS locomotion level  PARTICIPATION LIMITATIONS: community activity  PERSONAL FACTORS 1 comorbidity: lumbar spondylolisthesis   are also affecting patient's functional outcome.   REHAB POTENTIAL: Good  CLINICAL DECISION MAKING: Evolving/moderate complexity  EVALUATION COMPLEXITY: Moderate   GOALS: Goals reviewed with patient? Yes  SHORT TERM GOALS: Target date: 08/06/2022  Pt will be independent with initial HEP. Baseline: Goal status: MET on 08/06/2022  2.  Pt will be able to perform heel walking in clinic with UE support. Baseline:  Goal status: MET on 08/08/2022 following e-stim    LONG TERM GOALS: Target date: 09/07/2022  Pt will be independent with advanced HEP. Baseline:  Goal status: IN PROGRESS  2.  Pt will be able to maintain right single leg stance for at least 30 sec and LLE for at least 15 sec to decrease his risk of falling. Baseline: 19 sec RLE, 1.8 sec LLE Goal status: INITIAL  3.  Pt will increase LEFS to at least 90% to demonstrate improvement in functional mobility. Baseline: 80% Goal status: INITIAL  4.  Pt will increase bilateral dorsiflexion strength to at least 4+/5 to allow him to ambulate with decreased foot drop. Baseline:  Goal status: INITIAL  5.  Pt will report ability to play golf with no immediate increase in back pain. Baseline:  Goal status: INITIAL    PLAN: PT FREQUENCY: 2x/week  PT  DURATION: 8 weeks  PLANNED INTERVENTIONS: Therapeutic exercises, Therapeutic activity, Neuromuscular re-education, Balance training, Gait training, Patient/Family education, Self Care, Joint mobilization, Joint manipulation, Stair training, Aquatic Therapy, Dry Needling, Electrical stimulation, Spinal manipulation, Spinal mobilization, Cryotherapy, Moist heat, Taping, Vasopneumatic device, Traction, Ultrasound, Ionotophoresis 1m/ml Dexamethasone, Manual therapy, and Re-evaluation.  PLAN FOR NEXT SESSION: Assess response to e-stim, Progress HEP as indicated, strengthening, core stability, flexibility   Jennifer B. Fields, PT 08/16/22 9:33 AM   [place] [place] [address]Phone # [phone] [phone]

## 2022-08-21 ENCOUNTER — Ambulatory Visit: Payer: Medicare PPO

## 2022-08-21 DIAGNOSIS — R262 Difficulty in walking, not elsewhere classified: Secondary | ICD-10-CM

## 2022-08-21 DIAGNOSIS — R2689 Other abnormalities of gait and mobility: Secondary | ICD-10-CM

## 2022-08-21 DIAGNOSIS — M5459 Other low back pain: Secondary | ICD-10-CM | POA: Diagnosis not present

## 2022-08-21 DIAGNOSIS — R252 Cramp and spasm: Secondary | ICD-10-CM

## 2022-08-21 DIAGNOSIS — M6281 Muscle weakness (generalized): Secondary | ICD-10-CM | POA: Diagnosis not present

## 2022-08-21 NOTE — Therapy (Signed)
OUTPATIENT PHYSICAL THERAPY TREATMENT NOTE   Patient Name: Todd Kane MRN: [id number] DOB:[birth year], 66 y.o., male Today's Date: 08/21/2022   PT End of Session - 08/21/22 0854     Visit Number 8    Date for PT Re-Evaluation 09/07/22    Authorization Type [employer]    Authorization Time Period 07/16/22 - 09/07/2022    Authorization - Visit Number 8    Authorization - Number of Visits 16    Progress Note Due on Visit 10    PT Start Time 0846    PT Stop Time 0930    PT Time Calculation (min) 44 min    Activity Tolerance Patient tolerated treatment well    Behavior During Therapy [facility] for tasks assessed/performed             Past Medical History:  Diagnosis Date   Back pain    BPH (benign prostatic hyperplasia)    CAD (coronary artery disease)    a. mild-mod by cardiac CT 03/2017.   Chest pain    Hyperlipidemia    Hypertension    OSA (obstructive sleep apnea)    Pre-diabetes    Spondylolisthesis    Past Surgical History:  Procedure Laterality Date   MOUTH SURGERY  1982   3rd molars   TONSILLECTOMY AND ADENOIDECTOMY  1959   Patient Active Problem List   Diagnosis Date Noted   Pre-diabetes 08/03/2020   Coronary artery calcification seen on CAT scan 01/14/2018   Family history of early CAD 01/14/2018   Chest pain 01/29/2017   Chest pain of uncertain etiology 84/16/6063   Dyspnea on exertion 12/23/2014   Sciatica of right side 12/23/2014   Essential hypertension 12/23/2014   Hypercholesterolemia 12/23/2014    PCP: London Pepper, MD  REFERRING PROVIDER: Eustace Moore, MD   REFERRING DIAG: [phone] (ICD-10-CM) - Foot drop, left foot   Rationale for Evaluation and Treatment Rehabilitation  THERAPY DIAG:  Other abnormalities of gait and mobility  Muscle weakness (generalized)  Other low back pain  Cramp and spasm  Difficulty in walking, not elsewhere classified  ONSET DATE: Reports history of back pain, but foot drop started approx 4 weeks  ago  SUBJECTIVE:                                                                                                                                                                                           SUBJECTIVE STATEMENT: No new complaints.  A little fatigued after last session.     PERTINENT HISTORY:  Lumbar Spondylolisthesis  PAIN:  Are you having pain? Yes: NPRS scale: 0-1/10 Pain location: low back Pain  description: ache Aggravating factors: standing for prolonged periods and slow walks Relieving factors: sitting   PRECAUTIONS: None  WEIGHT BEARING RESTRICTIONS No  FALLS:  Has patient fallen in last 6 months? No  LIVING ENVIRONMENT: Lives with: lives with their spouse Lives in: House/apartment Stairs: Yes: Internal: 14 steps; on right going up and on left going up and External: 3 steps; on right going up Has following equipment at home: None  OCCUPATION: Retired Therapist, art for 30 years  PLOF: Independent and Leisure: golfing  PATIENT GOALS:  To avoid surgery and walk without foot drop.   OBJECTIVE:   DIAGNOSTIC FINDINGS:  Reports MRI of back, pt reports that he has lumbar spondylolisthesis of L3-5  PATIENT SURVEYS:  07/16/2022:  LEFS 64 / 80 = 80.0 %  SENSATION: Reports some numbness at times down left leg primarily, but at times down right leg  MUSCLE LENGTH: Tightness bilateral hamstrings (Left 43 deg, Right 52 deg)  POSTURE: rounded shoulders, forward head, and decreased lumbar lordosis  PALPATION: Tenderness to palpation along lumbar region with muscle spasms noted along lumbar multifidi  LUMBAR ROM:  WFL with some pain at times  LOWER EXTREMITY MMT:    07/16/2022: Right hip and knee strength 5/5 Right ankle dorsiflexion 4/5 Left hip 4+/5 Left dorsiflexion 3/5  FUNCTIONAL TESTS:  07/16/2022: Single leg stance:  right LE 19 sec, left LE 1.8 sec  08/08/2022: Single leg stance:  right LE 24 sec, left LE 4 sec on first attempt and 8 sec on second  attempt LLE single leg stance following e-stim:  14.8 sec  GAIT: Distance walked: >200 ft Assistive device utilized: None Level of assistance: Complete Independence Comments: Pt with some instability noted with ankles and decreased ankle dorsiflexion    TODAY'S TREATMENT: 08/21/2022: Nustep level 8 x 8 min with PT present to discuss status Wobble board DF/PF, half roll inv/ev x 2 min each 4 way ankle strengthening with red tband x 20 LLE Heel walking in gym 4x10 ft Single leg stance 5 x attempting 10 sec hold Left Dynamic balance: cone touches  3 x 10 on left Wall slides/squats 2 x 10 with feet equal and then 2 x 10  in tandem (left foot in back) [place] e-stim to left anterior tib:  Cycle time 10/30, Duty Cycle 50%, Ramp 2 sec.  Treatment time 10 min.  Adjusted intensity to allow for muscle contraction  (Intensity today at 52 mA) Educated/reminders on how to use balance strategies: ankle, knee hip and core to improve balance skills   08/16/2022: Nustep level 6 x7 min with PT present to discuss status [place] e-stim to left anterior tib:  Cycle time 10/30, Duty Cycle 50%, Ramp 2 sec.  Treatment time 10 min.  Adjusted intensity to allow for muscle contraction  (Intensity today at 45 mA) 4 way ankle strengthening with red tband x 20 LLE Heel walking in room 4x10 ft Single leg stance 5 x attempting 10 sec hold Left Educated patient on how to use balance strategies: ankle, knee hip and core to improve balance skills Dynamic balance: cone touches  3 x 10 on left Wall slides/squats 2x10 in tandem (left foot in back)  08/13/2022: Nustep level 6 x7 min with PT present to discuss status [place] e-stim to left anterior tib:  Cycle time 10/30, Duty Cycle 50%, Ramp 2 sec.  Treatment time 10 min.  Adjusted intensity to allow for muscle contraction 4 way ankle strengthening with red tband 2x10 LLE Heel walking at barre 4x10  ft Single leg stance Wall slides/squats 2x10 Rocker board x1  min  08/08/2022: Nustep level 5 x6 min with PT present to discuss status Single leg stance 4 way ankle strengthening with red tband 2x10 bilat [place] e-stim to left anterior tib:  Cycle time 10/30, Duty Cycle 50%, Ramp 2 sec.  Treatment time 10 min.  Adjusted intensity to allow for muscle contraction.  PT present performing constant assessments and adjustments throughout. Heel walking at barre 2x10 ft Ambulation down PT hallway and back Standing toe raises 3x10 Standing squats 2x10  PATIENT EDUCATION:  Education details: Issued HEP Person educated: Patient Education method: Explanation, Demonstration, and Handouts Education comprehension: verbalized understanding and returned demonstration   HOME EXERCISE PROGRAM: Access Code: [email or web address] URL: [email or web address] Date: 07/25/2022 Prepared by: Candyce Churn  Exercises - Supine Lower Trunk Rotation  - 1 x daily - 7 x weekly - 1 sets - 5 reps - 10 sec hold - Supine Transversus Abdominis Bracing - Hands on Stomach  - 1 x daily - 7 x weekly - 2 sets - 10 reps - Supine Piriformis Stretch with Foot on Ground  - 1 x daily - 7 x weekly - 1 sets - 2 reps - 20 sec hold - Supine Hamstring Stretch with Strap  - 1 x daily - 7 x weekly - 1 sets - 2 reps - 20 sec hold - Supine Single Knee to Chest Stretch  - 1 x daily - 7 x weekly - 1 sets - 3 reps - 20 sec hold - Seated Toe Raise  - 1 x daily - 7 x weekly - 2 sets - 10 reps - Heel Toe Raises with Counter Support  - 1 x daily - 7 x weekly - 2 sets - 10 reps - Seated Ankle Eversion with Resistance  - 1 x daily - 7 x weekly - 2 sets - 10 reps - Seated Ankle Inversion with Resistance and Legs Crossed  - 1 x daily - 7 x weekly - 2 sets - 10 reps - Ankle Dorsiflexion with Resistance  - 1 x daily - 7 x weekly - 2 sets - 10 reps - Supine Posterior Pelvic Tilt  - 1 x daily - 7 x weekly - 3 sets - 10 reps - Supine 90/90 Alternating Heel Touches with Posterior Pelvic Tilt  - 1 x daily - 7 x  weekly - 3 sets - 10 reps   ASSESSMENT:  CLINICAL IMPRESSION: Mr Luther Parody demonstrates improvement in balance strategies.  He was able to do SLS for 10 sec and was able to do 10 dynamic balance reps (cone touches) for 2 sets without LOB today.  He is well motivated and compliant.   He should continue to improve.    Pt continues to require skilled PT to progress towards goal related activities and improved left dorsiflexion.   OBJECTIVE IMPAIRMENTS decreased balance, difficulty walking, decreased strength, impaired flexibility, postural dysfunction, and pain.   ACTIVITY LIMITATIONS locomotion level  PARTICIPATION LIMITATIONS: community activity  PERSONAL FACTORS 1 comorbidity: lumbar spondylolisthesis  are also affecting patient's functional outcome.   REHAB POTENTIAL: Good  CLINICAL DECISION MAKING: Evolving/moderate complexity  EVALUATION COMPLEXITY: Moderate   GOALS: Goals reviewed with patient? Yes  SHORT TERM GOALS: Target date: 08/06/2022  Pt will be independent with initial HEP. Baseline: Goal status: MET on 08/06/2022  2.  Pt will be able to perform heel walking in clinic with UE support. Baseline:  Goal status: MET on 08/08/2022 following e-stim  LONG TERM GOALS: Target date: 09/07/2022  Pt will be independent with advanced HEP. Baseline:  Goal status: IN PROGRESS  2.  Pt will be able to maintain right single leg stance for at least 30 sec and LLE for at least 15 sec to decrease his risk of falling. Baseline: 19 sec RLE, 1.8 sec LLE Goal status: INITIAL  3.  Pt will increase LEFS to at least 90% to demonstrate improvement in functional mobility. Baseline: 80% Goal status: INITIAL  4.  Pt will increase bilateral dorsiflexion strength to at least 4+/5 to allow him to ambulate with decreased foot drop. Baseline:  Goal status: INITIAL  5.  Pt will report ability to play golf with no immediate increase in back pain. Baseline:  Goal status:  INITIAL    PLAN: PT FREQUENCY: 2x/week  PT DURATION: 8 weeks  PLANNED INTERVENTIONS: Therapeutic exercises, Therapeutic activity, Neuromuscular re-education, Balance training, Gait training, Patient/Family education, Self Care, Joint mobilization, Joint manipulation, Stair training, Aquatic Therapy, Dry Needling, Electrical stimulation, Spinal manipulation, Spinal mobilization, Cryotherapy, Moist heat, Taping, Vasopneumatic device, Traction, Ultrasound, Ionotophoresis 4mg /ml Dexamethasone, Manual therapy, and Re-evaluation.  PLAN FOR NEXT SESSION: Assess response to e-stim, Progress HEP as indicated, strengthening, core stability, flexibility   Harlo Fabela B. Jadian Karman, PT 08/21/22 9:33 AM   [place] [address] [place], [place] [address] Phone # [phone] Fax [phone]

## 2022-08-29 ENCOUNTER — Encounter: Payer: Self-pay | Admitting: Rehabilitative and Restorative Service Providers"

## 2022-08-29 ENCOUNTER — Ambulatory Visit: Payer: Medicare PPO | Admitting: Rehabilitative and Restorative Service Providers"

## 2022-08-29 DIAGNOSIS — M6281 Muscle weakness (generalized): Secondary | ICD-10-CM

## 2022-08-29 DIAGNOSIS — R252 Cramp and spasm: Secondary | ICD-10-CM | POA: Diagnosis not present

## 2022-08-29 DIAGNOSIS — R262 Difficulty in walking, not elsewhere classified: Secondary | ICD-10-CM

## 2022-08-29 DIAGNOSIS — M5459 Other low back pain: Secondary | ICD-10-CM

## 2022-08-29 DIAGNOSIS — R2689 Other abnormalities of gait and mobility: Secondary | ICD-10-CM | POA: Diagnosis not present

## 2022-08-29 NOTE — Therapy (Signed)
OUTPATIENT PHYSICAL THERAPY TREATMENT NOTE   Patient Name: Todd Kane MRN: [id number] DOB:1956/02/11, 66 y.o., male Today's Date: 08/29/2022   PT End of Session - 08/29/22 0848     Visit Number 9    Date for PT Re-Evaluation 09/07/22    Authorization Type [employer]    Authorization Time Period 07/16/22 - 09/07/2022    Authorization - Visit Number 9    Authorization - Number of Visits 16    Progress Note Due on Visit 10    PT Start Time 0845    PT Stop Time 0930    PT Time Calculation (min) 45 min    Activity Tolerance Patient tolerated treatment well    Behavior During Therapy [facility] for tasks assessed/performed             Past Medical History:  Diagnosis Date   Back pain    BPH (benign prostatic hyperplasia)    CAD (coronary artery disease)    a. mild-mod by cardiac CT 03/2017.   Chest pain    Hyperlipidemia    Hypertension    OSA (obstructive sleep apnea)    Pre-diabetes    Spondylolisthesis    Past Surgical History:  Procedure Laterality Date   MOUTH SURGERY  1982   3rd molars   TONSILLECTOMY AND ADENOIDECTOMY  1959   Patient Active Problem List   Diagnosis Date Noted   Pre-diabetes 08/03/2020   Coronary artery calcification seen on CAT scan 01/14/2018   Family history of early CAD 01/14/2018   Chest pain 01/29/2017   Chest pain of uncertain etiology 75/88/3254   Dyspnea on exertion 12/23/2014   Sciatica of right side 12/23/2014   Essential hypertension 12/23/2014   Hypercholesterolemia 12/23/2014    PCP: London Pepper, MD  REFERRING PROVIDER: Eustace Moore, MD   REFERRING DIAG: [phone] (ICD-10-CM) - Foot drop, left foot   Rationale for Evaluation and Treatment Rehabilitation  THERAPY DIAG:  Other abnormalities of gait and mobility  Muscle weakness (generalized)  Other low back pain  Cramp and spasm  Difficulty in walking, not elsewhere classified  ONSET DATE: Reports history of back pain, but foot drop started approx 4 weeks  ago  SUBJECTIVE:                                                                                                                                                                                           SUBJECTIVE STATEMENT: Pt denies pain today, states that he is feeling similar with his foot drop.  PERTINENT HISTORY:  Lumbar Spondylolisthesis  PAIN:  Are you having pain? Yes: NPRS scale: 0-1/10 Pain location: low back  Pain description: ache Aggravating factors: standing for prolonged periods and slow walks Relieving factors: sitting   PRECAUTIONS: None  WEIGHT BEARING RESTRICTIONS No  FALLS:  Has patient fallen in last 6 months? No  LIVING ENVIRONMENT: Lives with: lives with their spouse Lives in: House/apartment Stairs: Yes: Internal: 14 steps; on right going up and on left going up and External: 3 steps; on right going up Has following equipment at home: None  OCCUPATION: Retired Therapist, art for 30 years  PLOF: Independent and Leisure: golfing  PATIENT GOALS:  To avoid surgery and walk without foot drop.   OBJECTIVE:   DIAGNOSTIC FINDINGS:  Reports MRI of back, pt reports that he has lumbar spondylolisthesis of L3-5  PATIENT SURVEYS:  07/16/2022:  LEFS 64 / 80 = 80.0 %  SENSATION: Reports some numbness at times down left leg primarily, but at times down right leg  MUSCLE LENGTH: Tightness bilateral hamstrings (Left 43 deg, Right 52 deg)  POSTURE: rounded shoulders, forward head, and decreased lumbar lordosis  PALPATION: Tenderness to palpation along lumbar region with muscle spasms noted along lumbar multifidi  LUMBAR ROM:  WFL with some pain at times  LOWER EXTREMITY MMT:    07/16/2022: Right hip and knee strength 5/5 Right ankle dorsiflexion 4/5 Left hip 4+/5 Left dorsiflexion 3/5  FUNCTIONAL TESTS:  07/16/2022: Single leg stance:  right LE 19 sec, left LE 1.8 sec  08/08/2022: Single leg stance:  right LE 24 sec, left LE 4 sec on first attempt and 8  sec on second attempt LLE single leg stance following e-stim:  14.8 sec  GAIT: Distance walked: >200 ft Assistive device utilized: None Level of assistance: Complete Independence Comments: Pt with some instability noted with ankles and decreased ankle dorsiflexion    TODAY'S TREATMENT: 08/29/2022: Recumbent bike level 2 x6 min with PT present to discuss status Wobble board DF/PF, half roll inv/ev x 2 min each 4 way ankle strengthening with red tband 2x10 LLE Standing hamstring stretch at step 2x20 sec bilat Standing calf stretch on slanted rocker board 2x20 sec bilat Dynamic balance: cone touches  3 x 10 on left Heel walking in gym 4x10 ft [place] e-stim to left anterior tib:  Cycle time 10/30, Duty Cycle 50%, Ramp 2 sec.  Treatment time 10 min.  Adjusted intensity to allow for muscle contraction.  PT present performing constant assessments and adjustments throughout. (Intensity 78 mA CC).  Cuing throughout for performing dorsiflexion during e-stim contraction.  During one contraction, attempted heel walking x10 ft with pt reporting some marginal improvements.    08/21/2022: Nustep level 8 x 8 min with PT present to discuss status Wobble board DF/PF, half roll inv/ev x 2 min each 4 way ankle strengthening with red tband x 20 LLE Heel walking in gym 4x10 ft Single leg stance 5 x attempting 10 sec hold Left Dynamic balance: cone touches  3 x 10 on left Wall slides/squats 2 x 10 with feet equal and then 2 x 10  in tandem (left foot in back) [place] e-stim to left anterior tib:  Cycle time 10/30, Duty Cycle 50%, Ramp 2 sec.  Treatment time 10 min.  Adjusted intensity to allow for muscle contraction  (Intensity today at 52 mA) Educated/reminders on how to use balance strategies: ankle, knee hip and core to improve balance skills   08/16/2022: Nustep level 6 x7 min with PT present to discuss status [place] e-stim to left anterior tib:  Cycle time 10/30, Duty Cycle 50%, Ramp 2 sec.  Treatment time 10 min.  Adjusted intensity to allow for muscle contraction  (Intensity today at 45 mA) 4 way ankle strengthening with red tband x 20 LLE Heel walking in room 4x10 ft Single leg stance 5 x attempting 10 sec hold Left Educated patient on how to use balance strategies: ankle, knee hip and core to improve balance skills Dynamic balance: cone touches  3 x 10 on left Wall slides/squats 2x10 in tandem (left foot in back)   PATIENT EDUCATION:  Education details: Issued HEP Person educated: Patient Education method: Explanation, Demonstration, and Handouts Education comprehension: verbalized understanding and returned demonstration   HOME EXERCISE PROGRAM: Access Code: [email or web address] URL: [email or web address] Date: 07/25/2022 Prepared by: Candyce Churn  Exercises - Supine Lower Trunk Rotation  - 1 x daily - 7 x weekly - 1 sets - 5 reps - 10 sec hold - Supine Transversus Abdominis Bracing - Hands on Stomach  - 1 x daily - 7 x weekly - 2 sets - 10 reps - Supine Piriformis Stretch with Foot on Ground  - 1 x daily - 7 x weekly - 1 sets - 2 reps - 20 sec hold - Supine Hamstring Stretch with Strap  - 1 x daily - 7 x weekly - 1 sets - 2 reps - 20 sec hold - Supine Single Knee to Chest Stretch  - 1 x daily - 7 x weekly - 1 sets - 3 reps - 20 sec hold - Seated Toe Raise  - 1 x daily - 7 x weekly - 2 sets - 10 reps - Heel Toe Raises with Counter Support  - 1 x daily - 7 x weekly - 2 sets - 10 reps - Seated Ankle Eversion with Resistance  - 1 x daily - 7 x weekly - 2 sets - 10 reps - Seated Ankle Inversion with Resistance and Legs Crossed  - 1 x daily - 7 x weekly - 2 sets - 10 reps - Ankle Dorsiflexion with Resistance  - 1 x daily - 7 x weekly - 2 sets - 10 reps - Supine Posterior Pelvic Tilt  - 1 x daily - 7 x weekly - 3 sets - 10 reps - Supine 90/90 Alternating Heel Touches with Posterior Pelvic Tilt  - 1 x daily - 7 x weekly - 3 sets - 10 reps   ASSESSMENT:  CLINICAL  IMPRESSION: Mr Luther Parody presents to skilled PT reporting similar progress, but does state that he can feel that the e-stim is helping him at least have some short lived improvements with his improved left foot strength.  Pt continues to progress with increased intensity able to withstand during  [place] e-stim.  PT present throughout e-stim providing cuing for improved dorsiflexion during e-stim contraction.  Pt will be reassessed next visit to assess for discharge vs continued PT.  OBJECTIVE IMPAIRMENTS decreased balance, difficulty walking, decreased strength, impaired flexibility, postural dysfunction, and pain.   ACTIVITY LIMITATIONS locomotion level  PARTICIPATION LIMITATIONS: community activity  PERSONAL FACTORS 1 comorbidity: lumbar spondylolisthesis  are also affecting patient's functional outcome.   REHAB POTENTIAL: Good  CLINICAL DECISION MAKING: Evolving/moderate complexity  EVALUATION COMPLEXITY: Moderate   GOALS: Goals reviewed with patient? Yes  SHORT TERM GOALS: Target date: 08/06/2022  Pt will be independent with initial HEP. Baseline: Goal status: MET on 08/06/2022  2.  Pt will be able to perform heel walking in clinic with UE support. Baseline:  Goal status: MET on 08/08/2022 following e-stim    LONG TERM GOALS: Target  date: 09/07/2022  Pt will be independent with advanced HEP. Baseline:  Goal status: IN PROGRESS  2.  Pt will be able to maintain right single leg stance for at least 30 sec and LLE for at least 15 sec to decrease his risk of falling. Baseline: 19 sec RLE, 1.8 sec LLE Goal status: IN PROGRESS  3.  Pt will increase LEFS to at least 90% to demonstrate improvement in functional mobility. Baseline: 80% Goal status: INITIAL  4.  Pt will increase bilateral dorsiflexion strength to at least 4+/5 to allow him to ambulate with decreased foot drop. Baseline:  Goal status: INITIAL  5.  Pt will report ability to play golf with no immediate  increase in back pain. Baseline:  Goal status: INITIAL    PLAN: PT FREQUENCY: 2x/week  PT DURATION: 8 weeks  PLANNED INTERVENTIONS: Therapeutic exercises, Therapeutic activity, Neuromuscular re-education, Balance training, Gait training, Patient/Family education, Self Care, Joint mobilization, Joint manipulation, Stair training, Aquatic Therapy, Dry Needling, Electrical stimulation, Spinal manipulation, Spinal mobilization, Cryotherapy, Moist heat, Taping, Vasopneumatic device, Traction, Ultrasound, Ionotophoresis 1m/ml Dexamethasone, Manual therapy, and Re-evaluation.  PLAN FOR NEXT SESSION: Assess response to e-stim, Progress HEP as indicated, strengthening, core stability, flexibility   SJuel Burrow PT 08/29/22 10:00 AM   [place] SM[place] [address]Phone # [phone] [phone]

## 2022-09-07 ENCOUNTER — Ambulatory Visit: Payer: Medicare PPO | Admitting: Rehabilitative and Restorative Service Providers"

## 2022-09-10 DIAGNOSIS — M5416 Radiculopathy, lumbar region: Secondary | ICD-10-CM | POA: Diagnosis not present

## 2022-09-10 DIAGNOSIS — G609 Hereditary and idiopathic neuropathy, unspecified: Secondary | ICD-10-CM | POA: Diagnosis not present

## 2022-09-19 ENCOUNTER — Ambulatory Visit: Payer: Medicare PPO | Attending: Neurological Surgery | Admitting: Rehabilitative and Restorative Service Providers"

## 2022-09-19 ENCOUNTER — Encounter: Payer: Self-pay | Admitting: Rehabilitative and Restorative Service Providers"

## 2022-09-19 DIAGNOSIS — R262 Difficulty in walking, not elsewhere classified: Secondary | ICD-10-CM | POA: Diagnosis not present

## 2022-09-19 DIAGNOSIS — M5459 Other low back pain: Secondary | ICD-10-CM | POA: Insufficient documentation

## 2022-09-19 DIAGNOSIS — M6281 Muscle weakness (generalized): Secondary | ICD-10-CM | POA: Insufficient documentation

## 2022-09-19 DIAGNOSIS — R252 Cramp and spasm: Secondary | ICD-10-CM | POA: Diagnosis not present

## 2022-09-19 DIAGNOSIS — R2689 Other abnormalities of gait and mobility: Secondary | ICD-10-CM | POA: Insufficient documentation

## 2022-09-19 NOTE — Therapy (Signed)
OUTPATIENT PHYSICAL THERAPY TREATMENT NOTE AND REASSESSMENT   Patient Name: Todd Kane MRN: [id number] DOB:1956/09/12, 66 y.o., male Today's Date: 09/19/2022   Progress Note Reporting Period 07/16/2022 to 09/19/2022  See note below for Objective Data and Assessment of Progress/Goals.       PT End of Session - 09/19/22 0851     Visit Number 10    Date for PT Re-Evaluation 11/09/22    Authorization Type [employer]    Authorization Time Period new auth requested    Authorization - Visit Number 10    Authorization - Number of Visits 16    Progress Note Due on Visit 27    PT Start Time 0845    PT Stop Time 0925    PT Time Calculation (min) 40 min    Activity Tolerance Patient tolerated treatment well    Behavior During Therapy [facility] for tasks assessed/performed             Past Medical History:  Diagnosis Date   Back pain    BPH (benign prostatic hyperplasia)    CAD (coronary artery disease)    a. mild-mod by cardiac CT 03/2017.   Chest pain    Hyperlipidemia    Hypertension    OSA (obstructive sleep apnea)    Pre-diabetes    Spondylolisthesis    Past Surgical History:  Procedure Laterality Date   MOUTH SURGERY  1982   3rd molars   TONSILLECTOMY AND ADENOIDECTOMY  1959   Patient Active Problem List   Diagnosis Date Noted   Pre-diabetes 08/03/2020   Coronary artery calcification seen on CAT scan 01/14/2018   Family history of early CAD 01/14/2018   Chest pain 01/29/2017   Chest pain of uncertain etiology 08/65/7846   Dyspnea on exertion 12/23/2014   Sciatica of right side 12/23/2014   Essential hypertension 12/23/2014   Hypercholesterolemia 12/23/2014    PCP: London Pepper, MD  REFERRING PROVIDER: Eustace Moore, MD   REFERRING DIAG: [phone] (ICD-10-CM) - Foot drop, left foot   Rationale for Evaluation and Treatment Rehabilitation  THERAPY DIAG:  Other abnormalities of gait and mobility - Plan: PT plan of care cert/re-cert  Muscle  weakness (generalized) - Plan: PT plan of care cert/re-cert  Other low back pain - Plan: PT plan of care cert/re-cert  Cramp and spasm - Plan: PT plan of care cert/re-cert  Difficulty in walking, not elsewhere classified - Plan: PT plan of care cert/re-cert  ONSET DATE: Reports history of back pain, but foot drop started approx 4 weeks ago  SUBJECTIVE:  SUBJECTIVE STATEMENT: Pt states that he had his nerve conduction velocity test and he follows back up with Dr Ronnald Ramp on 09/25/22  PERTINENT HISTORY:  Lumbar Spondylolisthesis  PAIN:  Are you having pain? Yes: NPRS scale: 1/10 Pain location: low back Pain description: ache Aggravating factors: standing for prolonged periods and slow walks Relieving factors: sitting   PRECAUTIONS: None  WEIGHT BEARING RESTRICTIONS No  FALLS:  Has patient fallen in last 6 months? No  LIVING ENVIRONMENT: Lives with: lives with their spouse Lives in: House/apartment Stairs: Yes: Internal: 14 steps; on right going up and on left going up and External: 3 steps; on right going up Has following equipment at home: None  OCCUPATION: Retired Therapist, art for 30 years  PLOF: Independent and Leisure: golfing  PATIENT GOALS:  To avoid surgery and walk without foot drop.   OBJECTIVE:   DIAGNOSTIC FINDINGS:  Reports MRI of back, pt reports that he has lumbar spondylolisthesis of L3-5  Nerve Conduction Velocity Test on 09/10/2022: Abnormal study. Electrodiagnostic evidence of a chronic left lumbar radiculopathy, affecting the left L5 nerve root. No active denervation or axonal injury noted in the muscles supplied by the respective nerve root. Evidence of re-innervation noted, suggesting a good prognosis. 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 Absent left sural sensory nerve conduction study. This can be a normal finding in age > 73 yrs old. No electrodiagnostic evidence of a myopathy in the left lower extremity. No electrodiagnostic evidence of a common peroneal motor entrapment neuropathy at the level of the left fibular head.  PATIENT SURVEYS:  07/16/2022:  LEFS 64 / 80 = 80.0 % 09/19/2022:  Lower Extremity Functional Score: 64 / 80 = 80.0 %  SENSATION: Reports some numbness at times down left leg primarily, but at times down right leg  MUSCLE LENGTH: Eval:  Tightness bilateral hamstrings (Left 43 deg, Right 52 deg) 11/15/223:  Hamstring Left 55 degrees, Right 65 degrees  POSTURE: rounded shoulders, forward head, and decreased lumbar lordosis  PALPATION: Tenderness to palpation along lumbar region with muscle spasms noted along lumbar multifidi  LUMBAR ROM:  WFL with some pain at times  LOWER EXTREMITY MMT:    07/16/2022: Right hip and knee strength 5/5 Right ankle dorsiflexion 4/5 Left hip 4+/5 Left dorsiflexion 3/5  09/19/2022: Right hip and knee strength 5/5 Right ankle dorsiflexion 4+/5 Left hip 4+/5 Left dorsiflexion 3+/5  FUNCTIONAL TESTS:  07/16/2022: Single leg stance:  right LE 19 sec, left LE 1.8 sec  08/08/2022: Single leg stance:  right LE 24 sec, left LE 4 sec on first attempt and 8 sec on second attempt LLE single leg stance following e-stim:  14.8 sec  09/19/2022: Single leg stance:  right LE 32 sec, left LE 17.8 sec  GAIT: Distance walked: >200 ft Assistive device utilized: None Level of assistance: Complete Independence Comments: Pt with some instability noted with ankles and decreased ankle dorsiflexion    TODAY'S TREATMENT: 09/19/2022: Recumbent bike level 4 x6 min with PT present to discuss status LEFS Single leg stance and measuring hamstring  length Wobble board DF/PF and inv/ev x 2 min each LLE 4 way ankle strengthening with green tband 2x10 BLE   08/29/2022: Recumbent bike level 2 x6 min with PT present to discuss status Wobble board DF/PF, half roll inv/ev x 2 min each 4 way ankle strengthening with red tband 2x10 LLE Standing hamstring stretch at step  2x20 sec bilat Standing calf stretch on slanted rocker board 2x20 sec bilat Dynamic balance: cone touches  3 x 10 on left Heel walking in gym 4x10 ft [place] e-stim to left anterior tib:  Cycle time 10/30, Duty Cycle 50%, Ramp 2 sec.  Treatment time 10 min.  Adjusted intensity to allow for muscle contraction.  PT present performing constant assessments and adjustments throughout. (Intensity 78 mA CC).  Cuing throughout for performing dorsiflexion during e-stim contraction.  During one contraction, attempted heel walking x10 ft with pt reporting some marginal improvements.    08/21/2022: Nustep level 8 x 8 min with PT present to discuss status Wobble board DF/PF, half roll inv/ev x 2 min each 4 way ankle strengthening with red tband x 20 LLE Heel walking in gym 4x10 ft Single leg stance 5 x attempting 10 sec hold Left Dynamic balance: cone touches  3 x 10 on left Wall slides/squats 2 x 10 with feet equal and then 2 x 10  in tandem (left foot in back) [place] e-stim to left anterior tib:  Cycle time 10/30, Duty Cycle 50%, Ramp 2 sec.  Treatment time 10 min.  Adjusted intensity to allow for muscle contraction  (Intensity today at 52 mA) Educated/reminders on how to use balance strategies: ankle, knee hip and core to improve balance skills   08/16/2022: Nustep level 6 x7 min with PT present to discuss status [place] e-stim to left anterior tib:  Cycle time 10/30, Duty Cycle 50%, Ramp 2 sec.  Treatment time 10 min.  Adjusted intensity to allow for muscle contraction  (Intensity today at 45 mA) 4 way ankle strengthening with red tband x 20 LLE Heel walking in room 4x10  ft Single leg stance 5 x attempting 10 sec hold Left Educated patient on how to use balance strategies: ankle, knee hip and core to improve balance skills Dynamic balance: cone touches  3 x 10 on left Wall slides/squats 2x10 in tandem (left foot in back)   PATIENT EDUCATION:  Education details: Issued HEP Person educated: Patient Education method: Explanation, Demonstration, and Handouts Education comprehension: verbalized understanding and returned demonstration   HOME EXERCISE PROGRAM: Access Code: [email or web address] URL: [email or web address] Date: 07/25/2022 Prepared by: Candyce Churn  Exercises - Supine Lower Trunk Rotation  - 1 x daily - 7 x weekly - 1 sets - 5 reps - 10 sec hold - Supine Transversus Abdominis Bracing - Hands on Stomach  - 1 x daily - 7 x weekly - 2 sets - 10 reps - Supine Piriformis Stretch with Foot on Ground  - 1 x daily - 7 x weekly - 1 sets - 2 reps - 20 sec hold - Supine Hamstring Stretch with Strap  - 1 x daily - 7 x weekly - 1 sets - 2 reps - 20 sec hold - Supine Single Knee to Chest Stretch  - 1 x daily - 7 x weekly - 1 sets - 3 reps - 20 sec hold - Seated Toe Raise  - 1 x daily - 7 x weekly - 2 sets - 10 reps - Heel Toe Raises with Counter Support  - 1 x daily - 7 x weekly - 2 sets - 10 reps - Seated Ankle Eversion with Resistance  - 1 x daily - 7 x weekly - 2 sets - 10 reps - Seated Ankle Inversion with Resistance and Legs Crossed  - 1 x daily - 7 x weekly - 2 sets - 10 reps - Ankle Dorsiflexion with Resistance  -  1 x daily - 7 x weekly - 2 sets - 10 reps - Supine Posterior Pelvic Tilt  - 1 x daily - 7 x weekly - 3 sets - 10 reps - Supine 90/90 Alternating Heel Touches with Posterior Pelvic Tilt  - 1 x daily - 7 x weekly - 3 sets - 10 reps   ASSESSMENT:  CLINICAL IMPRESSION: Mr Luther Parody presents to skilled PT after taking time off until he had his nerve conduction velocity test.  Pt has made progress with hamstring length, strength,  and single leg balance.  Pt continues to have left foot drop and difficulty walking.  Pt able to progress with stronger theraband with exercises.  Pt has not yet met all goals and would benefit from an additional 2x/week for 8 weeks.  OBJECTIVE IMPAIRMENTS decreased balance, difficulty walking, decreased strength, impaired flexibility, postural dysfunction, and pain.   ACTIVITY LIMITATIONS locomotion level  PARTICIPATION LIMITATIONS: community activity  PERSONAL FACTORS 1 comorbidity: lumbar spondylolisthesis  are also affecting patient's functional outcome.   REHAB POTENTIAL: Good  CLINICAL DECISION MAKING: Evolving/moderate complexity  EVALUATION COMPLEXITY: Moderate   GOALS: Goals reviewed with patient? Yes  SHORT TERM GOALS: Target date: 08/06/2022  Pt will be independent with initial HEP. Baseline: Goal status: MET on 08/06/2022  2.  Pt will be able to perform heel walking in clinic with UE support. Baseline:  Goal status: MET on 08/08/2022 following e-stim    LONG TERM GOALS: Target date: 11/09/2022  Pt will be independent with advanced HEP. Baseline:  Goal status: IN PROGRESS  2.  Pt will be able to maintain right single leg stance for at least 30 sec and LLE for at least 15 sec to decrease his risk of falling. Baseline: 19 sec RLE, 1.8 sec LLE Goal status: MET on 09/19/2022  3.  Pt will increase LEFS to at least 90% to demonstrate improvement in functional mobility. Baseline: 80% Goal status: IN PROGRESS  4.  Pt will increase bilateral dorsiflexion strength to at least 4+/5 to allow him to ambulate with decreased foot drop. Baseline:  Goal status: IN PROGRESS  5.  Pt will report ability to play golf with no immediate increase in back pain. Baseline:  Goal status: IN PROGRESS    PLAN: PT FREQUENCY: 2x/week  PT DURATION: 8 weeks  PLANNED INTERVENTIONS: Therapeutic exercises, Therapeutic activity, Neuromuscular re-education, Balance training, Gait  training, Patient/Family education, Self Care, Joint mobilization, Joint manipulation, Stair training, Aquatic Therapy, Dry Needling, Electrical stimulation, Spinal manipulation, Spinal mobilization, Cryotherapy, Moist heat, Taping, Vasopneumatic device, Traction, Ultrasound, Ionotophoresis 95m/ml Dexamethasone, Manual therapy, and Re-evaluation.  PLAN FOR NEXT SESSION: E-stim as indicated, Progress HEP as indicated, strengthening, core stability, flexibility   SJuel Burrow PT 09/19/22 9:50 AM   [facility] [address] [place]orth [place] [address]Phone # [phone] [phone]

## 2022-09-25 DIAGNOSIS — M4316 Spondylolisthesis, lumbar region: Secondary | ICD-10-CM | POA: Diagnosis not present

## 2022-09-25 DIAGNOSIS — Z6829 Body mass index (BMI) 29.0-29.9, adult: Secondary | ICD-10-CM | POA: Diagnosis not present

## 2022-10-03 ENCOUNTER — Ambulatory Visit: Payer: Medicare PPO

## 2022-10-03 DIAGNOSIS — M6281 Muscle weakness (generalized): Secondary | ICD-10-CM

## 2022-10-03 DIAGNOSIS — R252 Cramp and spasm: Secondary | ICD-10-CM | POA: Diagnosis not present

## 2022-10-03 DIAGNOSIS — R262 Difficulty in walking, not elsewhere classified: Secondary | ICD-10-CM | POA: Diagnosis not present

## 2022-10-03 DIAGNOSIS — R2689 Other abnormalities of gait and mobility: Secondary | ICD-10-CM

## 2022-10-03 DIAGNOSIS — M5459 Other low back pain: Secondary | ICD-10-CM

## 2022-10-03 NOTE — Therapy (Signed)
OUTPATIENT PHYSICAL THERAPY TREATMENT NOTE AND REASSESSMENT   Patient Name: Todd Kane MRN: [id number] DOB:January 01, 1956, 66 y.o., male Today's Date: 10/03/2022   Progress Note Reporting Period 07/16/2022 to 09/19/2022  See note below for Objective Data and Assessment of Progress/Goals.       PT End of Session - 10/03/22 1026     Visit Number 11    Date for PT Re-Evaluation 11/09/22    Authorization Type [employer]    Authorization Time Period [employer] Approved 16 visits 09/19/2022-11/09/2022-auth#[id number]    Authorization - Number of Visits 11    Progress Note Due on Visit 67    PT Start Time 1022    PT Stop Time 1103    PT Time Calculation (min) 41 min    Activity Tolerance Patient tolerated treatment well    Behavior During Therapy WFL for tasks assessed/performed             Past Medical History:  Diagnosis Date   Back pain    BPH (benign prostatic hyperplasia)    CAD (coronary artery disease)    a. mild-mod by cardiac CT 03/2017.   Chest pain    Hyperlipidemia    Hypertension    OSA (obstructive sleep apnea)    Pre-diabetes    Spondylolisthesis    Past Surgical History:  Procedure Laterality Date   MOUTH SURGERY  1982   3rd molars   TONSILLECTOMY AND ADENOIDECTOMY  1959   Patient Active Problem List   Diagnosis Date Noted   Pre-diabetes 08/03/2020   Coronary artery calcification seen on CAT scan 01/14/2018   Family history of early CAD 01/14/2018   Chest pain 01/29/2017   Chest pain of uncertain etiology 00/37/0488   Dyspnea on exertion 12/23/2014   Sciatica of right side 12/23/2014   Essential hypertension 12/23/2014   Hypercholesterolemia 12/23/2014    PCP: London Pepper, MD  REFERRING PROVIDER: Eustace Moore, MD   REFERRING DIAG: [phone] (ICD-10-CM) - Foot drop, left foot   Rationale for Evaluation and Treatment Rehabilitation  THERAPY DIAG:  Other abnormalities of gait and mobility  Muscle weakness (generalized)  Other  low back pain  Cramp and spasm  ONSET DATE: Reports history of back pain, but foot drop started approx 4 weeks ago  SUBJECTIVE:                                                                                                                                                                                           SUBJECTIVE STATEMENT: Patient states that Dr. Ronnald Ramp did not see a significant issue with the nerve conduction to the left foot.  He feels  the issues with his feet are more due to neuropathy.  Patient states he is "about the same".    PERTINENT HISTORY:  Lumbar Spondylolisthesis  PAIN:  Are you having pain? Yes: NPRS scale: 1/10 Pain location: low back Pain description: ache Aggravating factors: standing for prolonged periods and slow walks Relieving factors: sitting   PRECAUTIONS: None  WEIGHT BEARING RESTRICTIONS No  FALLS:  Has patient fallen in last 6 months? No  LIVING ENVIRONMENT: Lives with: lives with their spouse Lives in: House/apartment Stairs: Yes: Internal: 14 steps; on right going up and on left going up and External: 3 steps; on right going up Has following equipment at home: None  OCCUPATION: Retired Therapist, art for 30 years  PLOF: Independent and Leisure: golfing  PATIENT GOALS:  To avoid surgery and walk without foot drop.   OBJECTIVE:   DIAGNOSTIC FINDINGS:  Reports MRI of back, pt reports that he has lumbar spondylolisthesis of L3-5  Nerve Conduction Velocity Test on 09/10/2022: Abnormal study. Electrodiagnostic evidence of a chronic left lumbar radiculopathy, affecting the left L5 nerve root. No active denervation or axonal injury noted in the muscles supplied by the respective nerve root. Evidence of re-innervation noted, suggesting a good prognosis. 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 Absent  left sural sensory nerve conduction study. This can be a normal finding in age > 72 yrs old. No electrodiagnostic evidence of a myopathy in the left lower extremity. No electrodiagnostic evidence of a common peroneal motor entrapment neuropathy at the level of the left fibular head.  PATIENT SURVEYS:  07/16/2022:  LEFS 64 / 80 = 80.0 % 09/19/2022:  Lower Extremity Functional Score: 64 / 80 = 80.0 %  SENSATION: Reports some numbness at times down left leg primarily, but at times down right leg  MUSCLE LENGTH: Eval:  Tightness bilateral hamstrings (Left 43 deg, Right 52 deg) 11/15/223:  Hamstring Left 55 degrees, Right 65 degrees  POSTURE: rounded shoulders, forward head, and decreased lumbar lordosis  PALPATION: Tenderness to palpation along lumbar region with muscle spasms noted along lumbar multifidi  LUMBAR ROM:  WFL with some pain at times  LOWER EXTREMITY MMT:    07/16/2022: Right hip and knee strength 5/5 Right ankle dorsiflexion 4/5 Left hip 4+/5 Left dorsiflexion 3/5  09/19/2022: Right hip and knee strength 5/5 Right ankle dorsiflexion 4+/5 Left hip 4+/5 Left dorsiflexion 3+/5  FUNCTIONAL TESTS:  07/16/2022: Single leg stance:  right LE 19 sec, left LE 1.8 sec  08/08/2022: Single leg stance:  right LE 24 sec, left LE 4 sec on first attempt and 8 sec on second attempt LLE single leg stance following e-stim:  14.8 sec  09/19/2022: Single leg stance:  right LE 32 sec, left LE 17.8 sec  GAIT: Distance walked: >200 ft Assistive device utilized: None Level of assistance: Complete Independence Comments: Pt with some instability noted with ankles and decreased ankle dorsiflexion    TODAY'S TREATMENT: 10/03/22 Nustep x 5 min level 5 (PT present to discuss progress and status) Hook lying PPT x 20 PPT with 90/90 heel taps x 20 PPT with dying bug x 20 Educated patient on need to establish good core strength and importance of flexibility if there is any chance of  him avoiding surgery. He admits compliance with his HEP has been sparse.  PT emphasized need to be more diligent to see if we gain better results and so that  he can recognize the merits of doing his HEP consistently.    09/19/2022: Recumbent bike level 4 x6 min with PT present to discuss status LEFS Single leg stance and measuring hamstring length Wobble board DF/PF and inv/ev x 2 min each LLE 4 way ankle strengthening with green tband 2x10 BLE   08/29/2022: Recumbent bike level 2 x6 min with PT present to discuss status Wobble board DF/PF, half roll inv/ev x 2 min each 4 way ankle strengthening with red tband 2x10 LLE Standing hamstring stretch at step 2x20 sec bilat Standing calf stretch on slanted rocker board 2x20 sec bilat Dynamic balance: cone touches  3 x 10 on left Heel walking in gym 4x10 ft [place] e-stim to left anterior tib:  Cycle time 10/30, Duty Cycle 50%, Ramp 2 sec.  Treatment time 10 min.  Adjusted intensity to allow for muscle contraction.  PT present performing constant assessments and adjustments throughout. (Intensity 78 mA CC).  Cuing throughout for performing dorsiflexion during e-stim contraction.  During one contraction, attempted heel walking x10 ft with pt reporting some marginal improvements.   PATIENT EDUCATION:  Education details: Issued HEP Person educated: Patient Education method: Explanation, Media planner, and Handouts Education comprehension: verbalized understanding and returned demonstration   HOME EXERCISE PROGRAM: Access Code: [email or web address] URL: [email or web address] Date: 07/25/2022 Prepared by: Candyce Churn  Exercises - Supine Lower Trunk Rotation  - 1 x daily - 7 x weekly - 1 sets - 5 reps - 10 sec hold - Supine Transversus Abdominis Bracing - Hands on Stomach  - 1 x daily - 7 x weekly - 2 sets - 10 reps - Supine Piriformis Stretch with Foot on Ground  - 1 x daily - 7 x weekly - 1 sets - 2 reps - 20 sec hold - Supine  Hamstring Stretch with Strap  - 1 x daily - 7 x weekly - 1 sets - 2 reps - 20 sec hold - Supine Single Knee to Chest Stretch  - 1 x daily - 7 x weekly - 1 sets - 3 reps - 20 sec hold - Seated Toe Raise  - 1 x daily - 7 x weekly - 2 sets - 10 reps - Heel Toe Raises with Counter Support  - 1 x daily - 7 x weekly - 2 sets - 10 reps - Seated Ankle Eversion with Resistance  - 1 x daily - 7 x weekly - 2 sets - 10 reps - Seated Ankle Inversion with Resistance and Legs Crossed  - 1 x daily - 7 x weekly - 2 sets - 10 reps - Ankle Dorsiflexion with Resistance  - 1 x daily - 7 x weekly - 2 sets - 10 reps - Supine Posterior Pelvic Tilt  - 1 x daily - 7 x weekly - 3 sets - 10 reps - Supine 90/90 Alternating Heel Touches with Posterior Pelvic Tilt  - 1 x daily - 7 x weekly - 3 sets - 10 reps   ASSESSMENT:  CLINICAL IMPRESSION: Mr Luther Parody needed heavy verbal and tactile cues for correct technique on pelvic tilt and core exercises.  He admits he has not been thorough with his HEP but will plan on doing better.  He would benefit from continued skilled PT to improve core stability and flexibility.    OBJECTIVE IMPAIRMENTS decreased balance, difficulty walking, decreased strength, impaired flexibility, postural dysfunction, and pain.   ACTIVITY LIMITATIONS locomotion level  PARTICIPATION LIMITATIONS: community activity  PERSONAL FACTORS 1 comorbidity: lumbar spondylolisthesis  are also  affecting patient's functional outcome.   REHAB POTENTIAL: Good  CLINICAL DECISION MAKING: Evolving/moderate complexity  EVALUATION COMPLEXITY: Moderate   GOALS: Goals reviewed with patient? Yes  SHORT TERM GOALS: Target date: 08/06/2022  Pt will be independent with initial HEP. Baseline: Goal status: MET on 08/06/2022  2.  Pt will be able to perform heel walking in clinic with UE support. Baseline:  Goal status: MET on 08/08/2022 following e-stim    LONG TERM GOALS: Target date: 11/09/2022  Pt will be  independent with advanced HEP. Baseline:  Goal status: IN PROGRESS  2.  Pt will be able to maintain right single leg stance for at least 30 sec and LLE for at least 15 sec to decrease his risk of falling. Baseline: 19 sec RLE, 1.8 sec LLE Goal status: MET on 09/19/2022  3.  Pt will increase LEFS to at least 90% to demonstrate improvement in functional mobility. Baseline: 80% Goal status: IN PROGRESS  4.  Pt will increase bilateral dorsiflexion strength to at least 4+/5 to allow him to ambulate with decreased foot drop. Baseline:  Goal status: IN PROGRESS  5.  Pt will report ability to play golf with no immediate increase in back pain. Baseline:  Goal status: IN PROGRESS    PLAN: PT FREQUENCY: 2x/week  PT DURATION: 8 weeks  PLANNED INTERVENTIONS: Therapeutic exercises, Therapeutic activity, Neuromuscular re-education, Balance training, Gait training, Patient/Family education, Self Care, Joint mobilization, Joint manipulation, Stair training, Aquatic Therapy, Dry Needling, Electrical stimulation, Spinal manipulation, Spinal mobilization, Cryotherapy, Moist heat, Taping, Vasopneumatic device, Traction, Ultrasound, Ionotophoresis 83m/ml Dexamethasone, Manual therapy, and Re-evaluation.  PLAN FOR NEXT SESSION: E-stim as indicated, Progress HEP as indicated, strengthening, core stability, flexibility   Taijon Vink B. Talis Iwan, PT 10/03/22 11:19 AM    [facility] [address] [place]  [address]Phone # [phone] [phone]

## 2022-10-09 ENCOUNTER — Ambulatory Visit: Payer: Medicare PPO | Attending: Neurological Surgery

## 2022-10-09 DIAGNOSIS — M5459 Other low back pain: Secondary | ICD-10-CM | POA: Diagnosis not present

## 2022-10-09 DIAGNOSIS — R252 Cramp and spasm: Secondary | ICD-10-CM | POA: Diagnosis not present

## 2022-10-09 DIAGNOSIS — R2689 Other abnormalities of gait and mobility: Secondary | ICD-10-CM | POA: Diagnosis not present

## 2022-10-09 DIAGNOSIS — R262 Difficulty in walking, not elsewhere classified: Secondary | ICD-10-CM | POA: Insufficient documentation

## 2022-10-09 DIAGNOSIS — M6281 Muscle weakness (generalized): Secondary | ICD-10-CM | POA: Insufficient documentation

## 2022-10-09 NOTE — Therapy (Signed)
OUTPATIENT PHYSICAL THERAPY TREATMENT NOTE AND REASSESSMENT   Patient Name: Todd Kane MRN: [id number] DOB:1956/07/20, 66 y.o., male Today's Date: 10/09/2022   Progress Note Reporting Period 07/16/2022 to 09/19/2022  See note below for Objective Data and Assessment of Progress/Goals.       PT End of Session - 10/09/22 0845     Visit Number 12    Date for PT Re-Evaluation 11/09/22    Authorization Type [employer]    Authorization Time Period [employer] Approved 16 visits 09/19/2022-11/09/2022-auth#[id number]    Authorization - Visit Number 12    Authorization - Number of Visits 16    Progress Note Due on Visit 16    PT Start Time 0844    PT Stop Time 0935    PT Time Calculation (min) 51 min    Activity Tolerance Patient tolerated treatment well    Behavior During Therapy WFL for tasks assessed/performed             Past Medical History:  Diagnosis Date   Back pain    BPH (benign prostatic hyperplasia)    CAD (coronary artery disease)    a. mild-mod by cardiac CT 03/2017.   Chest pain    Hyperlipidemia    Hypertension    OSA (obstructive sleep apnea)    Pre-diabetes    Spondylolisthesis    Past Surgical History:  Procedure Laterality Date   MOUTH SURGERY  1982   3rd molars   TONSILLECTOMY AND ADENOIDECTOMY  1959   Patient Active Problem List   Diagnosis Date Noted   Pre-diabetes 08/03/2020   Coronary artery calcification seen on CAT scan 01/14/2018   Family history of early CAD 01/14/2018   Chest pain 01/29/2017   Chest pain of uncertain etiology 86/76/7209   Dyspnea on exertion 12/23/2014   Sciatica of right side 12/23/2014   Essential hypertension 12/23/2014   Hypercholesterolemia 12/23/2014    PCP: London Pepper, MD  REFERRING PROVIDER: Eustace Moore, MD   REFERRING DIAG: [phone] (ICD-10-CM) - Foot drop, left foot   Rationale for Evaluation and Treatment Rehabilitation  THERAPY DIAG:  Other abnormalities of gait and  mobility  Muscle weakness (generalized)  Other low back pain  Cramp and spasm  ONSET DATE: Reports history of back pain, but foot drop started approx 4 weeks ago  SUBJECTIVE:                                                                                                                                                                                           SUBJECTIVE STATEMENT: Patient reports he "has been practicing" since last visit.  PERTINENT HISTORY:  Lumbar Spondylolisthesis  PAIN:  Are you having pain? Yes: NPRS scale: 1/10 Pain location: low back Pain description: ache Aggravating factors: standing for prolonged periods and slow walks Relieving factors: sitting   PRECAUTIONS: None  WEIGHT BEARING RESTRICTIONS No  FALLS:  Has patient fallen in last 6 months? No  LIVING ENVIRONMENT: Lives with: lives with their spouse Lives in: House/apartment Stairs: Yes: Internal: 14 steps; on right going up and on left going up and External: 3 steps; on right going up Has following equipment at home: None  OCCUPATION: Retired Therapist, art for 30 years  PLOF: Independent and Leisure: golfing  PATIENT GOALS:  To avoid surgery and walk without foot drop.   OBJECTIVE:   DIAGNOSTIC FINDINGS:  Reports MRI of back, pt reports that he has lumbar spondylolisthesis of L3-5  Nerve Conduction Velocity Test on 09/10/2022: Abnormal study. Electrodiagnostic evidence of a chronic left lumbar radiculopathy, affecting the left L5 nerve root. No active denervation or axonal injury noted in the muscles supplied by the respective nerve root. Evidence of re-innervation noted, suggesting a good prognosis. 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 Absent left sural sensory nerve conduction study. This can be a normal finding in age > 47 yrs old. No  electrodiagnostic evidence of a myopathy in the left lower extremity. No electrodiagnostic evidence of a common peroneal motor entrapment neuropathy at the level of the left fibular head.  PATIENT SURVEYS:  07/16/2022:  LEFS 64 / 80 = 80.0 % 09/19/2022:  Lower Extremity Functional Score: 64 / 80 = 80.0 %  SENSATION: Reports some numbness at times down left leg primarily, but at times down right leg  MUSCLE LENGTH: Eval:  Tightness bilateral hamstrings (Left 43 deg, Right 52 deg) 11/15/223:  Hamstring Left 55 degrees, Right 65 degrees  POSTURE: rounded shoulders, forward head, and decreased lumbar lordosis  PALPATION: Tenderness to palpation along lumbar region with muscle spasms noted along lumbar multifidi  LUMBAR ROM:  WFL with some pain at times  LOWER EXTREMITY MMT:    07/16/2022: Right hip and knee strength 5/5 Right ankle dorsiflexion 4/5 Left hip 4+/5 Left dorsiflexion 3/5  09/19/2022: Right hip and knee strength 5/5 Right ankle dorsiflexion 4+/5 Left hip 4+/5 Left dorsiflexion 3+/5  FUNCTIONAL TESTS:  07/16/2022: Single leg stance:  right LE 19 sec, left LE 1.8 sec  08/08/2022: Single leg stance:  right LE 24 sec, left LE 4 sec on first attempt and 8 sec on second attempt LLE single leg stance following e-stim:  14.8 sec  09/19/2022: Single leg stance:  right LE 32 sec, left LE 17.8 sec  GAIT: Distance walked: >200 ft Assistive device utilized: None Level of assistance: Complete Independence Comments: Pt with some instability noted with ankles and decreased ankle dorsiflexion    TODAY'S TREATMENT: 10/09/22 Nustep x 5 min level 5 (PT present to discuss progress and status) Education on dermatomes and sensory vs motor issues with nerve root pressure. Also educated on variations in leg pain including  Supine hamstring stretches (patient needs heavy vc's for correct hamstring isolation) Hook lying PPT x 20 PPT with 90/90 heel taps x 20 PPT with dying bug  x 20 PPT star crunch 2 x 10 PPT with physio ball pass 2 x 10 Reviewed proper hamstring stretching (patient continues to need heavy verbal cues for avoiding rounding the back)  10/03/22 Nustep x 5 min level 5 (  PT present to discuss progress and status) Hook lying PPT x 20 PPT with 90/90 heel taps x 20 PPT with dying bug x 20 Educated patient on need to establish good core strength and importance of flexibility if there is any chance of him avoiding surgery. He admits compliance with his HEP has been sparse.  PT emphasized need to be more diligent to see if we gain better results and so that he can recognize the merits of doing his HEP consistently.    09/19/2022: Recumbent bike level 4 x6 min with PT present to discuss status LEFS Single leg stance and measuring hamstring length Wobble board DF/PF and inv/ev x 2 min each LLE 4 way ankle strengthening with green tband 2x10 BLE  PATIENT EDUCATION:  Education details: Education on dermatomes and sensory vs motor issues with nerve root pressure. Also educated on variations in leg pain including  Supine hamstring stretches (patient needs heavy vc's for correct hamstring isolation) Person educated: Patient Education method: Explanation, Demonstration, and Handouts Education comprehension: verbalized understanding and returned demonstration   HOME EXERCISE PROGRAM: Access Code: [email or web address] URL: [email or web address] Date: 07/25/2022 Prepared by: Candyce Churn  Exercises - Supine Lower Trunk Rotation  - 1 x daily - 7 x weekly - 1 sets - 5 reps - 10 sec hold - Supine Transversus Abdominis Bracing - Hands on Stomach  - 1 x daily - 7 x weekly - 2 sets - 10 reps - Supine Piriformis Stretch with Foot on Ground  - 1 x daily - 7 x weekly - 1 sets - 2 reps - 20 sec hold - Supine Hamstring Stretch with Strap  - 1 x daily - 7 x weekly - 1 sets - 2 reps - 20 sec hold - Supine Single Knee to Chest Stretch  - 1 x daily - 7 x weekly - 1  sets - 3 reps - 20 sec hold - Seated Toe Raise  - 1 x daily - 7 x weekly - 2 sets - 10 reps - Heel Toe Raises with Counter Support  - 1 x daily - 7 x weekly - 2 sets - 10 reps - Seated Ankle Eversion with Resistance  - 1 x daily - 7 x weekly - 2 sets - 10 reps - Seated Ankle Inversion with Resistance and Legs Crossed  - 1 x daily - 7 x weekly - 2 sets - 10 reps - Ankle Dorsiflexion with Resistance  - 1 x daily - 7 x weekly - 2 sets - 10 reps - Supine Posterior Pelvic Tilt  - 1 x daily - 7 x weekly - 3 sets - 10 reps - Supine 90/90 Alternating Heel Touches with Posterior Pelvic Tilt  - 1 x daily - 7 x weekly - 3 sets - 10 reps   ASSESSMENT:  CLINICAL IMPRESSION: Mr Luther Parody shows improvement in ability to perform correct pelvic tilt/neutral pelvis, indicating that he was more compliant with his HEP.  He tolerated addition of some higher level core exercises but did mention some leg pain upon standing.    He would benefit from continued skilled PT to improve core stability and flexibility.    OBJECTIVE IMPAIRMENTS decreased balance, difficulty walking, decreased strength, impaired flexibility, postural dysfunction, and pain.   ACTIVITY LIMITATIONS locomotion level  PARTICIPATION LIMITATIONS: community activity  PERSONAL FACTORS 1 comorbidity: lumbar spondylolisthesis  are also affecting patient's functional outcome.   REHAB POTENTIAL: Good  CLINICAL DECISION MAKING: Evolving/moderate complexity  EVALUATION COMPLEXITY: Moderate   GOALS: Goals reviewed  with patient? Yes  SHORT TERM GOALS: Target date: 08/06/2022  Pt will be independent with initial HEP. Baseline: Goal status: MET on 08/06/2022  2.  Pt will be able to perform heel walking in clinic with UE support. Baseline:  Goal status: MET on 08/08/2022 following e-stim    LONG TERM GOALS: Target date: 11/09/2022  Pt will be independent with advanced HEP. Baseline:  Goal status: IN PROGRESS  2.  Pt will be able to  maintain right single leg stance for at least 30 sec and LLE for at least 15 sec to decrease his risk of falling. Baseline: 19 sec RLE, 1.8 sec LLE Goal status: MET on 09/19/2022  3.  Pt will increase LEFS to at least 90% to demonstrate improvement in functional mobility. Baseline: 80% Goal status: IN PROGRESS  4.  Pt will increase bilateral dorsiflexion strength to at least 4+/5 to allow him to ambulate with decreased foot drop. Baseline:  Goal status: IN PROGRESS  5.  Pt will report ability to play golf with no immediate increase in back pain. Baseline:  Goal status: IN PROGRESS    PLAN: PT FREQUENCY: 2x/week  PT DURATION: 8 weeks  PLANNED INTERVENTIONS: Therapeutic exercises, Therapeutic activity, Neuromuscular re-education, Balance training, Gait training, Patient/Family education, Self Care, Joint mobilization, Joint manipulation, Stair training, Aquatic Therapy, Dry Needling, Electrical stimulation, Spinal manipulation, Spinal mobilization, Cryotherapy, Moist heat, Taping, Vasopneumatic device, Traction, Ultrasound, Ionotophoresis 15m/ml Dexamethasone, Manual therapy, and Re-evaluation.  PLAN FOR NEXT SESSION: E-stim as indicated, Progress HEP as indicated, strengthening, core stability, flexibility   Adriene Padula B. Thara Searing, PT 10/09/22 9:56 AM   [place] [place] [address]Phone # [phone] [phone]

## 2022-10-11 ENCOUNTER — Ambulatory Visit: Payer: Medicare PPO

## 2022-10-16 ENCOUNTER — Ambulatory Visit: Payer: Medicare PPO

## 2022-10-16 DIAGNOSIS — M5459 Other low back pain: Secondary | ICD-10-CM | POA: Diagnosis not present

## 2022-10-16 DIAGNOSIS — R262 Difficulty in walking, not elsewhere classified: Secondary | ICD-10-CM

## 2022-10-16 DIAGNOSIS — M6281 Muscle weakness (generalized): Secondary | ICD-10-CM | POA: Diagnosis not present

## 2022-10-16 DIAGNOSIS — R2689 Other abnormalities of gait and mobility: Secondary | ICD-10-CM | POA: Diagnosis not present

## 2022-10-16 DIAGNOSIS — R252 Cramp and spasm: Secondary | ICD-10-CM

## 2022-10-16 NOTE — Therapy (Signed)
OUTPATIENT PHYSICAL THERAPY TREATMENT NOTE AND REASSESSMENT   Patient Name: Todd Kane MRN: [id number] DOB:Mar 13, 1956, 66 y.o., male Today's Date: 10/16/2022   Progress Note Reporting Period 07/16/2022 to 09/19/2022  See note below for Objective Data and Assessment of Progress/Goals.       PT End of Session - 10/16/22 0851     Visit Number 13    Number of Visits 16    Date for PT Re-Evaluation 11/09/22    Authorization Type [employer]    Authorization Time Period [employer] Approved 16 visits 09/19/2022-11/09/2022-auth#[id number]    Authorization - Visit Number 34    Authorization - Number of Visits 16    Progress Note Due on Visit 73    PT Start Time 0846    PT Stop Time 0930    PT Time Calculation (min) 44 min    Activity Tolerance Patient tolerated treatment well    Behavior During Therapy WFL for tasks assessed/performed             Past Medical History:  Diagnosis Date   Back pain    BPH (benign prostatic hyperplasia)    CAD (coronary artery disease)    a. mild-mod by cardiac CT 03/2017.   Chest pain    Hyperlipidemia    Hypertension    OSA (obstructive sleep apnea)    Pre-diabetes    Spondylolisthesis    Past Surgical History:  Procedure Laterality Date   MOUTH SURGERY  1982   3rd molars   TONSILLECTOMY AND ADENOIDECTOMY  1959   Patient Active Problem List   Diagnosis Date Noted   Pre-diabetes 08/03/2020   Coronary artery calcification seen on CAT scan 01/14/2018   Family history of early CAD 01/14/2018   Chest pain 01/29/2017   Chest pain of uncertain etiology 32/99/2426   Dyspnea on exertion 12/23/2014   Sciatica of right side 12/23/2014   Essential hypertension 12/23/2014   Hypercholesterolemia 12/23/2014    PCP: London Pepper, MD  REFERRING PROVIDER: Eustace Moore, MD   REFERRING DIAG: [phone] (ICD-10-CM) - Foot drop, left foot   Rationale for Evaluation and Treatment Rehabilitation  THERAPY DIAG:  Muscle weakness  (generalized)  Other low back pain  Cramp and spasm  Difficulty in walking, not elsewhere classified  ONSET DATE: Reports history of back pain, but foot drop started approx 4 weeks ago  SUBJECTIVE:                                                                                                                                                                                           SUBJECTIVE STATEMENT: Patient reports he is doing ok, no  pain reported.      PERTINENT HISTORY:  Lumbar Spondylolisthesis  PAIN:  Are you having pain? Yes: NPRS scale: 1/10 Pain location: low back Pain description: ache Aggravating factors: standing for prolonged periods and slow walks Relieving factors: sitting   PRECAUTIONS: None  WEIGHT BEARING RESTRICTIONS No  FALLS:  Has patient fallen in last 6 months? No  LIVING ENVIRONMENT: Lives with: lives with their spouse Lives in: House/apartment Stairs: Yes: Internal: 14 steps; on right going up and on left going up and External: 3 steps; on right going up Has following equipment at home: None  OCCUPATION: Retired Therapist, art for 30 years  PLOF: Independent and Leisure: golfing  PATIENT GOALS:  To avoid surgery and walk without foot drop.   OBJECTIVE:   DIAGNOSTIC FINDINGS:  Reports MRI of back, pt reports that he has lumbar spondylolisthesis of L3-5  Nerve Conduction Velocity Test on 09/10/2022: Abnormal study. Electrodiagnostic evidence of a chronic left lumbar radiculopathy, affecting the left L5 nerve root. No active denervation or axonal injury noted in the muscles supplied by the respective nerve root. Evidence of re-innervation noted, suggesting a good prognosis. 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 Absent left sural sensory nerve conduction study. This can be a normal finding in age > 85  yrs old. No electrodiagnostic evidence of a myopathy in the left lower extremity. No electrodiagnostic evidence of a common peroneal motor entrapment neuropathy at the level of the left fibular head.  PATIENT SURVEYS:  07/16/2022:  LEFS 64 / 80 = 80.0 % 09/19/2022:  Lower Extremity Functional Score: 64 / 80 = 80.0 %  SENSATION: Reports some numbness at times down left leg primarily, but at times down right leg  MUSCLE LENGTH: Eval:  Tightness bilateral hamstrings (Left 43 deg, Right 52 deg) 11/15/223:  Hamstring Left 55 degrees, Right 65 degrees  POSTURE: rounded shoulders, forward head, and decreased lumbar lordosis  PALPATION: Tenderness to palpation along lumbar region with muscle spasms noted along lumbar multifidi  LUMBAR ROM:  WFL with some pain at times  LOWER EXTREMITY MMT:    07/16/2022: Right hip and knee strength 5/5 Right ankle dorsiflexion 4/5 Left hip 4+/5 Left dorsiflexion 3/5  09/19/2022: Right hip and knee strength 5/5 Right ankle dorsiflexion 4+/5 Left hip 4+/5 Left dorsiflexion 3+/5  FUNCTIONAL TESTS:  07/16/2022: Single leg stance:  right LE 19 sec, left LE 1.8 sec  08/08/2022: Single leg stance:  right LE 24 sec, left LE 4 sec on first attempt and 8 sec on second attempt LLE single leg stance following e-stim:  14.8 sec  09/19/2022: Single leg stance:  right LE 32 sec, left LE 17.8 sec  GAIT: Distance walked: >200 ft Assistive device utilized: None Level of assistance: Complete Independence Comments: Pt with some instability noted with ankles and decreased ankle dorsiflexion    TODAY'S TREATMENT: 10/16/22 Nustep x 5 min level 5 (PT present to discuss progress and status) Standing hamstring and quad stretching 3 x 30 sec each LE at steps (for quad -place chair behind patient)  Hook lying PPT x 20 PPT with 90/90 heel taps x 20 Piriformis stretch x 2 each LE  SKTC x 2 each LE PPT with dying bug x 20 PPT star crunch 2 x 10 PPT with physio  ball pass 2 x 10 PPT with shoulder extension to SLR with yellow plyo ball 2 x 10 each side [place] twist  2 x 10 Reviewed use of ice if soreness post session  10/09/22 Nustep x 5 min level 5 (PT present to discuss progress and status) Education on dermatomes and sensory vs motor issues with nerve root pressure. Also educated on variations in leg pain including  Supine hamstring stretches (patient needs heavy vc's for correct hamstring isolation) Hook lying PPT x 20 PPT with 90/90 heel taps x 20 PPT with dying bug x 20 PPT star crunch 2 x 10 PPT with physio ball pass 2 x 10 Reviewed proper hamstring stretching (patient continues to need heavy verbal cues for avoiding rounding the back)  10/03/22 Nustep x 5 min level 5 (PT present to discuss progress and status) Hook lying PPT x 20 PPT with 90/90 heel taps x 20 PPT with dying bug x 20 Educated patient on need to establish good core strength and importance of flexibility if there is any chance of him avoiding surgery. He admits compliance with his HEP has been sparse.  PT emphasized need to be more diligent to see if we gain better results and so that he can recognize the merits of doing his HEP consistently.    09/19/2022: Recumbent bike level 4 x6 min with PT present to discuss status LEFS Single leg stance and measuring hamstring length Wobble board DF/PF and inv/ev x 2 min each LLE 4 way ankle strengthening with green tband 2x10 BLE  PATIENT EDUCATION:  Education details: Education on dermatomes and sensory vs motor issues with nerve root pressure. Also educated on variations in leg pain including  Supine hamstring stretches (patient needs heavy vc's for correct hamstring isolation) Person educated: Patient Education method: Explanation, Demonstration, and Handouts Education comprehension: verbalized understanding and returned demonstration   HOME EXERCISE PROGRAM: Access Code: [email or web address] URL:  [email or web address] Date: 07/25/2022 Prepared by: Candyce Churn  Exercises - Supine Lower Trunk Rotation  - 1 x daily - 7 x weekly - 1 sets - 5 reps - 10 sec hold - Supine Transversus Abdominis Bracing - Hands on Stomach  - 1 x daily - 7 x weekly - 2 sets - 10 reps - Supine Piriformis Stretch with Foot on Ground  - 1 x daily - 7 x weekly - 1 sets - 2 reps - 20 sec hold - Supine Hamstring Stretch with Strap  - 1 x daily - 7 x weekly - 1 sets - 2 reps - 20 sec hold - Supine Single Knee to Chest Stretch  - 1 x daily - 7 x weekly - 1 sets - 3 reps - 20 sec hold - Seated Toe Raise  - 1 x daily - 7 x weekly - 2 sets - 10 reps - Heel Toe Raises with Counter Support  - 1 x daily - 7 x weekly - 2 sets - 10 reps - Seated Ankle Eversion with Resistance  - 1 x daily - 7 x weekly - 2 sets - 10 reps - Seated Ankle Inversion with Resistance and Legs Crossed  - 1 x daily - 7 x weekly - 2 sets - 10 reps - Ankle Dorsiflexion with Resistance  - 1 x daily - 7 x weekly - 2 sets - 10 reps - Supine Posterior Pelvic Tilt  - 1 x daily - 7 x weekly - 3 sets - 10 reps - Supine 90/90 Alternating Heel Touches with Posterior Pelvic Tilt  - 1 x daily - 7 x weekly - 3 sets - 10 reps   ASSESSMENT:  CLINICAL IMPRESSION: Mr Luther Parody is  progressing appropriately.  He is more compliant with his HEP.  He continues to have some tightness in the right low back and has pain with trunk rotation to left in this area.  He was encouraged to continue working on doing this stretch at home, slowing increasing his ROM.     He would benefit from continued skilled PT to improve core stability and flexibility.    OBJECTIVE IMPAIRMENTS decreased balance, difficulty walking, decreased strength, impaired flexibility, postural dysfunction, and pain.   ACTIVITY LIMITATIONS locomotion level  PARTICIPATION LIMITATIONS: community activity  PERSONAL FACTORS 1 comorbidity: lumbar spondylolisthesis  are also affecting  patient's functional outcome.   REHAB POTENTIAL: Good  CLINICAL DECISION MAKING: Evolving/moderate complexity  EVALUATION COMPLEXITY: Moderate   GOALS: Goals reviewed with patient? Yes  SHORT TERM GOALS: Target date: 08/06/2022  Pt will be independent with initial HEP. Baseline: Goal status: MET on 08/06/2022  2.  Pt will be able to perform heel walking in clinic with UE support. Baseline:  Goal status: MET on 08/08/2022 following e-stim    LONG TERM GOALS: Target date: 11/09/2022  Pt will be independent with advanced HEP. Baseline:  Goal status: IN PROGRESS  2.  Pt will be able to maintain right single leg stance for at least 30 sec and LLE for at least 15 sec to decrease his risk of falling. Baseline: 19 sec RLE, 1.8 sec LLE Goal status: MET on 09/19/2022  3.  Pt will increase LEFS to at least 90% to demonstrate improvement in functional mobility. Baseline: 80% Goal status: IN PROGRESS  4.  Pt will increase bilateral dorsiflexion strength to at least 4+/5 to allow him to ambulate with decreased foot drop. Baseline:  Goal status: IN PROGRESS  5.  Pt will report ability to play golf with no immediate increase in back pain. Baseline:  Goal status: IN PROGRESS    PLAN: PT FREQUENCY: 2x/week  PT DURATION: 8 weeks  PLANNED INTERVENTIONS: Therapeutic exercises, Therapeutic activity, Neuromuscular re-education, Balance training, Gait training, Patient/Family education, Self Care, Joint mobilization, Joint manipulation, Stair training, Aquatic Therapy, Dry Needling, Electrical stimulation, Spinal manipulation, Spinal mobilization, Cryotherapy, Moist heat, Taping, Vasopneumatic device, Traction, Ultrasound, Ionotophoresis 53m/ml Dexamethasone, Manual therapy, and Re-evaluation.  PLAN FOR NEXT SESSION: E-stim as indicated, Progress HEP as indicated, strengthening, core stability, flexibility   Nyra Anspaugh B. Devantae Babe, PT 10/16/22 9:30 AM   [id number] S[place] [address]Phone # [phone] [phone]

## 2022-10-18 ENCOUNTER — Ambulatory Visit: Payer: Medicare PPO

## 2022-10-18 DIAGNOSIS — M6281 Muscle weakness (generalized): Secondary | ICD-10-CM | POA: Diagnosis not present

## 2022-10-18 DIAGNOSIS — R262 Difficulty in walking, not elsewhere classified: Secondary | ICD-10-CM

## 2022-10-18 DIAGNOSIS — R252 Cramp and spasm: Secondary | ICD-10-CM

## 2022-10-18 DIAGNOSIS — M5459 Other low back pain: Secondary | ICD-10-CM | POA: Diagnosis not present

## 2022-10-18 DIAGNOSIS — R2689 Other abnormalities of gait and mobility: Secondary | ICD-10-CM | POA: Diagnosis not present

## 2022-10-18 NOTE — Therapy (Signed)
OUTPATIENT PHYSICAL THERAPY TREATMENT NOTE AND REASSESSMENT   Patient Name: Todd Kane MRN: [id number] DOB:1955-12-05, 66 y.o., male Today's Date: 10/18/2022   Progress Note Reporting Period 07/16/2022 to 09/19/2022  See note below for Objective Data and Assessment of Progress/Goals.       PT End of Session - 10/18/22 0900     Visit Number 14    Number of Visits 16    Date for PT Re-Evaluation 11/09/22    Authorization Type [employer]    Authorization Time Period [employer] Approved 16 visits 09/19/2022-11/09/2022-auth#[id number]    Authorization - Visit Number 48    Authorization - Number of Visits 16    Progress Note Due on Visit 15    PT Start Time 0845    PT Stop Time 0930    PT Time Calculation (min) 45 min    Activity Tolerance Patient tolerated treatment well    Behavior During Therapy WFL for tasks assessed/performed             Past Medical History:  Diagnosis Date   Back pain    BPH (benign prostatic hyperplasia)    CAD (coronary artery disease)    a. mild-mod by cardiac CT 03/2017.   Chest pain    Hyperlipidemia    Hypertension    OSA (obstructive sleep apnea)    Pre-diabetes    Spondylolisthesis    Past Surgical History:  Procedure Laterality Date   MOUTH SURGERY  1982   3rd molars   TONSILLECTOMY AND ADENOIDECTOMY  1959   Patient Active Problem List   Diagnosis Date Noted   Pre-diabetes 08/03/2020   Coronary artery calcification seen on CAT scan 01/14/2018   Family history of early CAD 01/14/2018   Chest pain 01/29/2017   Chest pain of uncertain etiology 95/18/8416   Dyspnea on exertion 12/23/2014   Sciatica of right side 12/23/2014   Essential hypertension 12/23/2014   Hypercholesterolemia 12/23/2014    PCP: London Pepper, MD  REFERRING PROVIDER: Eustace Moore, MD   REFERRING DIAG: [phone] (ICD-10-CM) - Foot drop, left foot   Rationale for Evaluation and Treatment Rehabilitation  THERAPY DIAG:  Muscle weakness  (generalized)  Other low back pain  Cramp and spasm  Difficulty in walking, not elsewhere classified  ONSET DATE: Reports history of back pain, but foot drop started approx 4 weeks ago  SUBJECTIVE:                                                                                                                                                                                           SUBJECTIVE STATEMENT: Patient reports he is a little sore  from last session.  Did not have to take any extra pain medication but just a little more sore.        PERTINENT HISTORY:  Lumbar Spondylolisthesis  PAIN:  Are you having pain? Yes: NPRS scale: 1/10 Pain location: low back Pain description: ache Aggravating factors: standing for prolonged periods and slow walks Relieving factors: sitting   PRECAUTIONS: None  WEIGHT BEARING RESTRICTIONS No  FALLS:  Has patient fallen in last 6 months? No  LIVING ENVIRONMENT: Lives with: lives with their spouse Lives in: House/apartment Stairs: Yes: Internal: 14 steps; on right going up and on left going up and External: 3 steps; on right going up Has following equipment at home: None  OCCUPATION: Retired Therapist, art for 30 years  PLOF: Independent and Leisure: golfing  PATIENT GOALS:  To avoid surgery and walk without foot drop.   OBJECTIVE:   DIAGNOSTIC FINDINGS:  Reports MRI of back, pt reports that he has lumbar spondylolisthesis of L3-5  Nerve Conduction Velocity Test on 09/10/2022: Abnormal study. Electrodiagnostic evidence of a chronic left lumbar radiculopathy, affecting the left L5 nerve root. No active denervation or axonal injury noted in the muscles supplied by the respective nerve root. Evidence of re-innervation noted, suggesting a good prognosis. 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 Absent  left sural sensory nerve conduction study. This can be a normal finding in age > 49 yrs old. No electrodiagnostic evidence of a myopathy in the left lower extremity. No electrodiagnostic evidence of a common peroneal motor entrapment neuropathy at the level of the left fibular head.  PATIENT SURVEYS:  07/16/2022:  LEFS 64 / 80 = 80.0 % 09/19/2022:  Lower Extremity Functional Score: 64 / 80 = 80.0 %  SENSATION: Reports some numbness at times down left leg primarily, but at times down right leg  MUSCLE LENGTH: Eval:  Tightness bilateral hamstrings (Left 43 deg, Right 52 deg) 11/15/223:  Hamstring Left 55 degrees, Right 65 degrees  POSTURE: rounded shoulders, forward head, and decreased lumbar lordosis  PALPATION: Tenderness to palpation along lumbar region with muscle spasms noted along lumbar multifidi  LUMBAR ROM:  WFL with some pain at times  LOWER EXTREMITY MMT:    07/16/2022: Right hip and knee strength 5/5 Right ankle dorsiflexion 4/5 Left hip 4+/5 Left dorsiflexion 3/5  09/19/2022: Right hip and knee strength 5/5 Right ankle dorsiflexion 4+/5 Left hip 4+/5 Left dorsiflexion 3+/5  FUNCTIONAL TESTS:  07/16/2022: Single leg stance:  right LE 19 sec, left LE 1.8 sec  08/08/2022: Single leg stance:  right LE 24 sec, left LE 4 sec on first attempt and 8 sec on second attempt LLE single leg stance following e-stim:  14.8 sec  09/19/2022: Single leg stance:  right LE 32 sec, left LE 17.8 sec  GAIT: Distance walked: >200 ft Assistive device utilized: None Level of assistance: Complete Independence Comments: Pt with some instability noted with ankles and decreased ankle dorsiflexion    TODAY'S TREATMENT: 10/18/22 Nustep x 5 min level 5 (PT present to discuss progress and status) Supine hamstring stretch 3 x 30 sec  each LE Seated mini sit ups with blue plyo ball 2 x 10 Seated shoulder to hip with blue plyo ball x 10 each side Seated hip to hip with hollow hold  position 2 x 10 with blue plyo ball Seated ankle DF with yellow loop x 20 (2 sets) Hook lying PPT x  20 PPT with 90/90 heel taps x 20  10/16/22 Nustep x 5 min level 5 (PT present to discuss progress and status) Standing hamstring and quad stretching 3 x 30 sec each LE at steps (for quad -place chair behind patient)  Hook lying PPT x 20 PPT with 90/90 heel taps x 20 Piriformis stretch x 2 each LE  SKTC x 2 each LE PPT with dying bug x 20 PPT star crunch 2 x 10 PPT with physio ball pass 2 x 10 PPT with shoulder extension to SLR with yellow plyo ball 2 x 10 each side [place] twist 2 x 10 Reviewed use of ice if soreness post session  10/09/22 Nustep x 5 min level 5 (PT present to discuss progress and status) Education on dermatomes and sensory vs motor issues with nerve root pressure. Also educated on variations in leg pain including  Supine hamstring stretches (patient needs heavy vc's for correct hamstring isolation) Hook lying PPT x 20 PPT with 90/90 heel taps x 20 PPT with dying bug x 20 PPT star crunch 2 x 10 PPT with physio ball pass 2 x 10 Reviewed proper hamstring stretching (patient continues to need heavy verbal cues for avoiding rounding the back)   PATIENT EDUCATION:  Education details: Education on dermatomes and sensory vs motor issues with nerve root pressure. Also educated on variations in leg pain including  Supine hamstring stretches (patient needs heavy vc's for correct hamstring isolation) Person educated: Patient Education method: Explanation, Demonstration, and Handouts Education comprehension: verbalized understanding and returned demonstration   HOME EXERCISE PROGRAM: Access Code: [email or web address] URL: [email or web address] Date: 07/25/2022 Prepared by: Candyce Churn  Exercises - Supine Lower Trunk Rotation  - 1 x daily - 7 x weekly - 1 sets - 5 reps - 10 sec hold - Supine Transversus Abdominis Bracing - Hands on Stomach  - 1 x daily - 7 x  weekly - 2 sets - 10 reps - Supine Piriformis Stretch with Foot on Ground  - 1 x daily - 7 x weekly - 1 sets - 2 reps - 20 sec hold - Supine Hamstring Stretch with Strap  - 1 x daily - 7 x weekly - 1 sets - 2 reps - 20 sec hold - Supine Single Knee to Chest Stretch  - 1 x daily - 7 x weekly - 1 sets - 3 reps - 20 sec hold - Seated Toe Raise  - 1 x daily - 7 x weekly - 2 sets - 10 reps - Heel Toe Raises with Counter Support  - 1 x daily - 7 x weekly - 2 sets - 10 reps - Seated Ankle Eversion with Resistance  - 1 x daily - 7 x weekly - 2 sets - 10 reps - Seated Ankle Inversion with Resistance and Legs Crossed  - 1 x daily - 7 x weekly - 2 sets - 10 reps - Ankle Dorsiflexion with Resistance  - 1 x daily - 7 x weekly - 2 sets - 10 reps - Supine Posterior Pelvic Tilt  - 1 x daily - 7 x weekly - 3 sets - 10 reps - Supine 90/90 Alternating Heel Touches with Posterior Pelvic Tilt  - 1 x daily - 7 x weekly - 3 sets - 10 reps   ASSESSMENT:  CLINICAL IMPRESSION: Mr Luther Parody was a little sore from advancing his core exercises, therefore we modified today's treatment.  He was able to complete all of today's tasks without pain.     He  would benefit from continued skilled PT to improve core stability and flexibility.    OBJECTIVE IMPAIRMENTS decreased balance, difficulty walking, decreased strength, impaired flexibility, postural dysfunction, and pain.   ACTIVITY LIMITATIONS locomotion level  PARTICIPATION LIMITATIONS: community activity  PERSONAL FACTORS 1 comorbidity: lumbar spondylolisthesis  are also affecting patient's functional outcome.   REHAB POTENTIAL: Good  CLINICAL DECISION MAKING: Evolving/moderate complexity  EVALUATION COMPLEXITY: Moderate   GOALS: Goals reviewed with patient? Yes  SHORT TERM GOALS: Target date: 08/06/2022  Pt will be independent with initial HEP. Baseline: Goal status: MET on 08/06/2022  2.  Pt will be able to perform heel walking in clinic with UE  support. Baseline:  Goal status: MET on 08/08/2022 following e-stim    LONG TERM GOALS: Target date: 11/09/2022  Pt will be independent with advanced HEP. Baseline:  Goal status: IN PROGRESS  2.  Pt will be able to maintain right single leg stance for at least 30 sec and LLE for at least 15 sec to decrease his risk of falling. Baseline: 19 sec RLE, 1.8 sec LLE Goal status: MET on 09/19/2022  3.  Pt will increase LEFS to at least 90% to demonstrate improvement in functional mobility. Baseline: 80% Goal status: IN PROGRESS  4.  Pt will increase bilateral dorsiflexion strength to at least 4+/5 to allow him to ambulate with decreased foot drop. Baseline:  Goal status: IN PROGRESS  5.  Pt will report ability to play golf with no immediate increase in back pain. Baseline:  Goal status: IN PROGRESS    PLAN: PT FREQUENCY: 2x/week  PT DURATION: 8 weeks  PLANNED INTERVENTIONS: Therapeutic exercises, Therapeutic activity, Neuromuscular re-education, Balance training, Gait training, Patient/Family education, Self Care, Joint mobilization, Joint manipulation, Stair training, Aquatic Therapy, Dry Needling, Electrical stimulation, Spinal manipulation, Spinal mobilization, Cryotherapy, Moist heat, Taping, Vasopneumatic device, Traction, Ultrasound, Ionotophoresis 25m/ml Dexamethasone, Manual therapy, and Re-evaluation.  PLAN FOR NEXT SESSION: E-stim as indicated, Progress HEP as indicated, strengthening, core stability, flexibility   Cadynce Garrette B. Paydon Carll, PT 10/18/22 9:31 AM   [id number] SW[place] [address]Phone # [phone] [phone]

## 2022-10-23 ENCOUNTER — Encounter: Payer: Self-pay | Admitting: Rehabilitative and Restorative Service Providers"

## 2022-10-23 ENCOUNTER — Ambulatory Visit: Payer: Medicare PPO | Admitting: Rehabilitative and Restorative Service Providers"

## 2022-10-23 DIAGNOSIS — M5459 Other low back pain: Secondary | ICD-10-CM | POA: Diagnosis not present

## 2022-10-23 DIAGNOSIS — R2689 Other abnormalities of gait and mobility: Secondary | ICD-10-CM

## 2022-10-23 DIAGNOSIS — R262 Difficulty in walking, not elsewhere classified: Secondary | ICD-10-CM | POA: Diagnosis not present

## 2022-10-23 DIAGNOSIS — M6281 Muscle weakness (generalized): Secondary | ICD-10-CM | POA: Diagnosis not present

## 2022-10-23 DIAGNOSIS — R252 Cramp and spasm: Secondary | ICD-10-CM

## 2022-10-23 NOTE — Therapy (Signed)
OUTPATIENT PHYSICAL THERAPY TREATMENT NOTE AND REASSESSMENT   Patient Name: Todd Kane MRN: [id number] DOB:08-30-1956, 66 y.o., male Today's Date: 10/23/2022   Progress Note Reporting Period 07/16/2022 to 09/19/2022  See note below for Objective Data and Assessment of Progress/Goals.       PT End of Session - 10/23/22 0850     Visit Number 15    Number of Visits --    Date for PT Re-Evaluation 11/09/22    Authorization Type [employer]    Authorization Time Period [employer] Approved 16 visits 09/19/2022-11/09/2022-auth#[id number]    Authorization - Visit Number 6    Authorization - Number of Visits 16    Progress Note Due on Visit 13    PT Start Time 0845    PT Stop Time 0925    PT Time Calculation (min) 40 min    Activity Tolerance Patient tolerated treatment well    Behavior During Therapy [facility] for tasks assessed/performed             Past Medical History:  Diagnosis Date   Back pain    BPH (benign prostatic hyperplasia)    CAD (coronary artery disease)    a. mild-mod by cardiac CT 03/2017.   Chest pain    Hyperlipidemia    Hypertension    OSA (obstructive sleep apnea)    Pre-diabetes    Spondylolisthesis    Past Surgical History:  Procedure Laterality Date   MOUTH SURGERY  1982   3rd molars   TONSILLECTOMY AND ADENOIDECTOMY  1959   Patient Active Problem List   Diagnosis Date Noted   Pre-diabetes 08/03/2020   Coronary artery calcification seen on CAT scan 01/14/2018   Family history of early CAD 01/14/2018   Chest pain 01/29/2017   Chest pain of uncertain etiology 63/87/5643   Dyspnea on exertion 12/23/2014   Sciatica of right side 12/23/2014   Essential hypertension 12/23/2014   Hypercholesterolemia 12/23/2014    PCP: London Pepper, MD  REFERRING PROVIDER: Eustace Moore, MD   REFERRING DIAG: [phone] (ICD-10-CM) - Foot drop, left foot   Rationale for Evaluation and Treatment Rehabilitation  THERAPY DIAG:  Muscle weakness  (generalized)  Other low back pain  Cramp and spasm  Difficulty in walking, not elsewhere classified  Other abnormalities of gait and mobility  ONSET DATE: Reports history of back pain, but foot drop started approx 4 weeks ago  SUBJECTIVE:                                                                                                                                                                                           SUBJECTIVE STATEMENT:  Patient reports that he had to reschedule his appointment for Thursday to tomorrow.  "I think one of my problems is that I need to get to the gym"        PERTINENT HISTORY:  Lumbar Spondylolisthesis  PAIN:  Are you having pain? Yes: NPRS scale: 1/10 Pain location: low back Pain description: ache Aggravating factors: standing for prolonged periods and slow walks Relieving factors: sitting   PRECAUTIONS: None  WEIGHT BEARING RESTRICTIONS No  FALLS:  Has patient fallen in last 6 months? No  LIVING ENVIRONMENT: Lives with: lives with their spouse Lives in: House/apartment Stairs: Yes: Internal: 14 steps; on right going up and on left going up and External: 3 steps; on right going up Has following equipment at home: None  OCCUPATION: Retired Therapist, art for 30 years  PLOF: Independent and Leisure: golfing  PATIENT GOALS:  To avoid surgery and walk without foot drop.   OBJECTIVE:   DIAGNOSTIC FINDINGS:  Reports MRI of back, pt reports that he has lumbar spondylolisthesis of L3-5  Nerve Conduction Velocity Test on 09/10/2022: Abnormal study. Electrodiagnostic evidence of a chronic left lumbar radiculopathy, affecting the left L5 nerve root. No active denervation or axonal injury noted in the muscles supplied by the respective nerve root. Evidence of re-innervation noted, suggesting a good prognosis. 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 Absent left sural sensory nerve conduction study. This can be a normal finding in age > 64 yrs old. No electrodiagnostic evidence of a myopathy in the left lower extremity. No electrodiagnostic evidence of a common peroneal motor entrapment neuropathy at the level of the left fibular head.  PATIENT SURVEYS:  07/16/2022:  LEFS 64 / 80 = 80.0 % 09/19/2022:  Lower Extremity Functional Score: 64 / 80 = 80.0 %  SENSATION: Reports some numbness at times down left leg primarily, but at times down right leg  MUSCLE LENGTH: Eval:  Tightness bilateral hamstrings (Left 43 deg, Right 52 deg) 11/15/223:  Hamstring Left 55 degrees, Right 65 degrees  POSTURE: rounded shoulders, forward head, and decreased lumbar lordosis  PALPATION: Tenderness to palpation along lumbar region with muscle spasms noted along lumbar multifidi  LUMBAR ROM:  WFL with some pain at times  LOWER EXTREMITY MMT:    07/16/2022: Right hip and knee strength 5/5 Right ankle dorsiflexion 4/5 Left hip 4+/5 Left dorsiflexion 3/5  09/19/2022: Right hip and knee strength 5/5 Right ankle dorsiflexion 4+/5 Left hip 4+/5 Left dorsiflexion 3+/5  FUNCTIONAL TESTS:  07/16/2022: Single leg stance:  right LE 19 sec, left LE 1.8 sec  08/08/2022: Single leg stance:  right LE 24 sec, left LE 4 sec on first attempt and 8 sec on second attempt LLE single leg stance following e-stim:  14.8 sec  09/19/2022: Single leg stance:  right LE 32 sec, left LE 17.8 sec  GAIT: Distance walked: >200 ft Assistive device utilized: None Level of assistance: Complete Independence Comments: Pt with some instability noted with ankles and decreased ankle dorsiflexion    TODAY'S TREATMENT:  10/23/2022: Nustep level 5  x 8 min with PT present to discuss progress and status Seated mini sit ups with blue plyo ball 2 x 10 Seated shoulder to hip with blue plyo ball x 10 each side Seated modified  dead lift with blue plyo ball 2x10 Supine piriformis stretch 2x20 sec Supine double knee to chest (DKTC) 2x20 sec Supine hamstring stretch with  strap 2x20 sec bilat Supine posterior pelvic tilt (PPT) with 90/90 heel taps x 20 PPT with physio ball pass 2 x 10 Ankle inversion, eversion, and dorsiflexion with yellow loop 2x10 bilat   10/18/22 Nustep x 5 min level 5 (PT present to discuss progress and status) Supine hamstring stretch 3 x 30 sec  each LE Seated mini sit ups with blue plyo ball 2 x 10 Seated shoulder to hip with blue plyo ball x 10 each side Seated hip to hip with hollow hold position 2 x 10 with blue plyo ball Seated ankle DF with yellow loop x 20 (2 sets) Hook lying PPT x 20 PPT with 90/90 heel taps x 20  10/16/22 Nustep x 5 min level 5 (PT present to discuss progress and status) Standing hamstring and quad stretching 3 x 30 sec each LE at steps (for quad -place chair behind patient)  Hook lying PPT x 20 PPT with 90/90 heel taps x 20 Piriformis stretch x 2 each LE  SKTC x 2 each LE PPT with dying bug x 20 PPT star crunch 2 x 10 PPT with physio ball pass 2 x 10 PPT with shoulder extension to SLR with yellow plyo ball 2 x 10 each side Russian twist 2 x 10 Reviewed use of ice if soreness post session  10/09/22 Nustep x 5 min level 5 (PT present to discuss progress and status) Education on dermatomes and sensory vs motor issues with nerve root pressure. Also educated on variations in leg pain including  Supine hamstring stretches (patient needs heavy vc's for correct hamstring isolation) Hook lying PPT x 20 PPT with 90/90 heel taps x 20 PPT with dying bug x 20 PPT star crunch 2 x 10 PPT with physio ball pass 2 x 10 Reviewed proper hamstring stretching (patient continues to need heavy verbal cues for avoiding rounding the back)   PATIENT EDUCATION:  Education details: Education on dermatomes and sensory vs motor issues with nerve root pressure. Also educated on  variations in leg pain including  Supine hamstring stretches (patient needs heavy vc's for correct hamstring isolation) Person educated: Patient Education method: Explanation, Demonstration, and Handouts Education comprehension: verbalized understanding and returned demonstration   HOME EXERCISE PROGRAM: Access Code: [email or web address] URL: [email or web address] Date: 07/25/2022 Prepared by: Candyce Churn  Exercises - Supine Lower Trunk Rotation  - 1 x daily - 7 x weekly - 1 sets - 5 reps - 10 sec hold - Supine Transversus Abdominis Bracing - Hands on Stomach  - 1 x daily - 7 x weekly - 2 sets - 10 reps - Supine Piriformis Stretch with Foot on Ground  - 1 x daily - 7 x weekly - 1 sets - 2 reps - 20 sec hold - Supine Hamstring Stretch with Strap  - 1 x daily - 7 x weekly - 1 sets - 2 reps - 20 sec hold - Supine Single Knee to Chest Stretch  - 1 x daily - 7 x weekly - 1 sets - 3 reps - 20 sec hold - Seated Toe Raise  - 1 x daily - 7 x weekly - 2 sets - 10 reps - Heel Toe Raises with Counter Support  - 1 x daily - 7 x weekly - 2 sets - 10 reps - Seated Ankle Eversion with Resistance  - 1 x daily - 7 x weekly - 2 sets - 10 reps - Seated Ankle Inversion with Resistance and Legs Crossed  - 1 x daily - 7 x  weekly - 2 sets - 10 reps - Ankle Dorsiflexion with Resistance  - 1 x daily - 7 x weekly - 2 sets - 10 reps - Supine Posterior Pelvic Tilt  - 1 x daily - 7 x weekly - 3 sets - 10 reps - Supine 90/90 Alternating Heel Touches with Posterior Pelvic Tilt  - 1 x daily - 7 x weekly - 3 sets - 10 reps   ASSESSMENT:  CLINICAL IMPRESSION: Mr Todd Kane presents to skilled PT admitting that he knows that he needs to increase compliance with going to work out at a gym to continue HEP.  Patient able to progress with exercises with some soreness, especially with higher level core exercises.  Patient continues to require skilled PT to progress towards goal related activities.  OBJECTIVE  IMPAIRMENTS decreased balance, difficulty walking, decreased strength, impaired flexibility, postural dysfunction, and pain.   ACTIVITY LIMITATIONS locomotion level  PARTICIPATION LIMITATIONS: community activity  PERSONAL FACTORS 1 comorbidity: lumbar spondylolisthesis  are also affecting patient's functional outcome.   REHAB POTENTIAL: Good  CLINICAL DECISION MAKING: Evolving/moderate complexity  EVALUATION COMPLEXITY: Moderate   GOALS: Goals reviewed with patient? Yes  SHORT TERM GOALS: Target date: 08/06/2022  Pt will be independent with initial HEP. Baseline: Goal status: MET on 08/06/2022  2.  Pt will be able to perform heel walking in clinic with UE support. Baseline:  Goal status: MET on 08/08/2022 following e-stim    LONG TERM GOALS: Target date: 11/09/2022  Pt will be independent with advanced HEP. Baseline:  Goal status: IN PROGRESS  2.  Pt will be able to maintain right single leg stance for at least 30 sec and LLE for at least 15 sec to decrease his risk of falling. Baseline: 19 sec RLE, 1.8 sec LLE Goal status: MET on 09/19/2022  3.  Pt will increase LEFS to at least 90% to demonstrate improvement in functional mobility. Baseline: 80% Goal status: IN PROGRESS  4.  Pt will increase bilateral dorsiflexion strength to at least 4+/5 to allow him to ambulate with decreased foot drop. Baseline:  Goal status: IN PROGRESS  5.  Pt will report ability to play golf with no immediate increase in back pain. Baseline:  Goal status: IN PROGRESS    PLAN: PT FREQUENCY: 2x/week  PT DURATION: 8 weeks  PLANNED INTERVENTIONS: Therapeutic exercises, Therapeutic activity, Neuromuscular re-education, Balance training, Gait training, Patient/Family education, Self Care, Joint mobilization, Joint manipulation, Stair training, Aquatic Therapy, Dry Needling, Electrical stimulation, Spinal manipulation, Spinal mobilization, Cryotherapy, Moist heat, Taping, Vasopneumatic device,  Traction, Ultrasound, Ionotophoresis 21m/ml Dexamethasone, Manual therapy, and Re-evaluation.  PLAN FOR NEXT SESSION: E-stim as indicated, Progress HEP as indicated, strengthening, core stability, flexibility   SJuel Burrow PT 10/23/22 9:36 AM   [address] [place] [address]Phone # [phone] [phone]

## 2022-10-24 ENCOUNTER — Ambulatory Visit: Payer: Medicare PPO

## 2022-10-24 DIAGNOSIS — R252 Cramp and spasm: Secondary | ICD-10-CM | POA: Diagnosis not present

## 2022-10-24 DIAGNOSIS — M5459 Other low back pain: Secondary | ICD-10-CM

## 2022-10-24 DIAGNOSIS — M6281 Muscle weakness (generalized): Secondary | ICD-10-CM

## 2022-10-24 DIAGNOSIS — R262 Difficulty in walking, not elsewhere classified: Secondary | ICD-10-CM | POA: Diagnosis not present

## 2022-10-24 DIAGNOSIS — R2689 Other abnormalities of gait and mobility: Secondary | ICD-10-CM | POA: Diagnosis not present

## 2022-10-24 NOTE — Therapy (Signed)
OUTPATIENT PHYSICAL THERAPY TREATMENT NOTE AND REASSESSMENT   Patient Name: Todd Kane MRN: [id number] DOB:05-05-1956, 66 y.o., male Today's Date: 10/24/2022   PHYSICAL THERAPY DISCHARGE SUMMARY  Visits from Start of Care: 16  Current functional level related to goals / functional outcomes: See below   Remaining deficits: See below   Education / Equipment: See below   Patient agrees to discharge. Patient goals were partially met. Patient is being discharged due to maximized rehab potential.       PT End of Session - 10/24/22 0850     Visit Number 16    Number of Visits 16    Date for PT Re-Evaluation 11/09/22    Authorization Type [employer]    Authorization - Number of Visits 16    Progress Note Due on Visit 20    PT Start Time 0847    PT Stop Time 0935    PT Time Calculation (min) 48 min    Activity Tolerance Patient tolerated treatment well    Behavior During Therapy [facility] for tasks assessed/performed             Past Medical History:  Diagnosis Date   Back pain    BPH (benign prostatic hyperplasia)    CAD (coronary artery disease)    a. mild-mod by cardiac CT 03/2017.   Chest pain    Hyperlipidemia    Hypertension    OSA (obstructive sleep apnea)    Pre-diabetes    Spondylolisthesis    Past Surgical History:  Procedure Laterality Date   MOUTH SURGERY  1982   3rd molars   TONSILLECTOMY AND ADENOIDECTOMY  1959   Patient Active Problem List   Diagnosis Date Noted   Pre-diabetes 08/03/2020   Coronary artery calcification seen on CAT scan 01/14/2018   Family history of early CAD 01/14/2018   Chest pain 01/29/2017   Chest pain of uncertain etiology 70/35/0093   Dyspnea on exertion 12/23/2014   Sciatica of right side 12/23/2014   Essential hypertension 12/23/2014   Hypercholesterolemia 12/23/2014    PCP: London Pepper, MD  REFERRING PROVIDER: Eustace Moore, MD   REFERRING DIAG: [phone] (ICD-10-CM) - Foot drop, left foot    Rationale for Evaluation and Treatment Rehabilitation  THERAPY DIAG:  Muscle weakness (generalized)  Other low back pain  Cramp and spasm  Difficulty in walking, not elsewhere classified  ONSET DATE: Reports history of back pain, but foot drop started approx 4 weeks ago  SUBJECTIVE:  SUBJECTIVE STATEMENT: Patient reports no changes since yesterday.          PERTINENT HISTORY:  Lumbar Spondylolisthesis  PAIN:  Are you having pain? Yes: NPRS scale: 1/10 Pain location: low back Pain description: ache Aggravating factors: standing for prolonged periods and slow walks Relieving factors: sitting   PRECAUTIONS: None  WEIGHT BEARING RESTRICTIONS No  FALLS:  Has patient fallen in last 6 months? No  LIVING ENVIRONMENT: Lives with: lives with their spouse Lives in: House/apartment Stairs: Yes: Internal: 14 steps; on right going up and on left going up and External: 3 steps; on right going up Has following equipment at home: None  OCCUPATION: Retired Therapist, art for 30 years  PLOF: Independent and Leisure: golfing  PATIENT GOALS:  To avoid surgery and walk without foot drop.   OBJECTIVE:   DIAGNOSTIC FINDINGS:  Reports MRI of back, pt reports that he has lumbar spondylolisthesis of L3-5  Nerve Conduction Velocity Test on 09/10/2022: Abnormal study. Electrodiagnostic evidence of a chronic left lumbar radiculopathy, affecting the left L5 nerve root. No active denervation or axonal injury noted in the muscles supplied by the respective nerve root. Evidence of re-innervation noted, suggesting a good prognosis. 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 Electrodiagnostic evidence of a left tibial axonal motor peripheral  neuropathy. Absent left sural sensory nerve conduction study. This can be a normal finding in age > 60 yrs old. No electrodiagnostic evidence of a myopathy in the left lower extremity. No electrodiagnostic evidence of a common peroneal motor entrapment neuropathy at the level of the left fibular head.  PATIENT SURVEYS:  07/16/2022:  LEFS 64 / 80 = 80.0 % 09/19/2022:  Lower Extremity Functional Score: 64 / 80 = 80.0 % 10/24/22:  LEFS : 67/80 = 82%  SENSATION: Reports some numbness at times down left leg primarily, but at times down right leg 10/24/22: no changes in this per patient  MUSCLE LENGTH: Eval:  Tightness bilateral hamstrings (Left 43 deg, Right 52 deg) 11/15/223:  Hamstring Left 55 degrees, Right 65 degrees 10/24/22: Not tested; patient actively stretching daily  POSTURE: rounded shoulders, forward head, and decreased lumbar lordosis 10/24/22: Minor improvements  PALPATION: Tenderness to palpation along lumbar region with muscle spasms noted along lumbar multifidi  LUMBAR ROM:  WFL with some pain at times  LOWER EXTREMITY MMT:    07/16/2022: Right hip and knee strength 5/5 Right ankle dorsiflexion 4/5 Left hip 4+/5 Left dorsiflexion 3/5  09/19/2022: Right hip and knee strength 5/5 Right ankle dorsiflexion 4+/5 Left hip 4+/5 Left dorsiflexion 3+/5  10/24/22: Right hip and knee strength 5/5 Right ankle dorsiflexion 5/5 Left hip 5/5 Left dorsiflexion 3+/5  FUNCTIONAL TESTS:  07/16/2022: Single leg stance:  right LE 19 sec, left LE 1.8 sec  08/08/2022: Single leg stance:  right LE 24 sec, left LE 4 sec on first attempt and 8 sec on second attempt LLE single leg stance following e-stim:  14.8 sec  09/19/2022: Single leg stance:  right LE 32 sec, left LE 17.8 sec   10/24/2022: Single leg stance:  right LE 32 sec,  left LE 32 sec (with excessive balance corrections and strategies)  GAIT: Distance walked: >200 ft Assistive device utilized: None Level of  assistance: Complete Independence Comments: Pt with some instability noted with ankles and decreased ankle dorsiflexion    TODAY'S TREATMENT: 10/24/2022: Nustep level 5  x 8 min with PT present to discuss progress and status Re-assessment : see above Review  HEP  10/23/2022: Nustep level 5  x 8 min with PT present to discuss progress and status Seated mini sit ups with blue plyo ball 2 x 10 Seated shoulder to hip with blue plyo ball x 10 each side Seated modified dead lift with blue plyo ball 2x10 Supine piriformis stretch 2x20 sec Supine double knee to chest (DKTC) 2x20 sec Supine hamstring stretch with strap 2x20 sec bilat Supine posterior pelvic tilt (PPT) with 90/90 heel taps x 20 PPT with physio ball pass 2 x 10 Ankle inversion, eversion, and dorsiflexion with yellow loop 2x10 bilat   10/18/22 Nustep x 5 min level 5 (PT present to discuss progress and status) Supine hamstring stretch 3 x 30 sec  each LE Seated mini sit ups with blue plyo ball 2 x 10 Seated shoulder to hip with blue plyo ball x 10 each side Seated hip to hip with hollow hold position 2 x 10 with blue plyo ball Seated ankle DF with yellow loop x 20 (2 sets) Hook lying PPT x 20 PPT with 90/90 heel taps x 20   PATIENT EDUCATION:  Education details: Education on dermatomes and sensory vs motor issues with nerve root pressure. Also educated on variations in leg pain including  Supine hamstring stretches (patient needs heavy vc's for correct hamstring isolation) Person educated: Patient Education method: Explanation, Demonstration, and Handouts Education comprehension: verbalized understanding and returned demonstration   HOME EXERCISE PROGRAM: Access Code: [email or web address] URL: [email or web address] Date: 10/24/2022 Prepared by: Candyce Churn  Exercises - Supine Lower Trunk Rotation  - 1 x daily - 7 x weekly - 1 sets - 5 reps - 10 sec hold - Supine Piriformis Stretch with Foot on Ground  - 1 x  daily - 7 x weekly - 1 sets - 2 reps - 20 sec hold - Supine Hamstring Stretch with Strap  - 1 x daily - 7 x weekly - 1 sets - 2 reps - 20 sec hold - Supine Single Knee to Chest Stretch  - 1 x daily - 7 x weekly - 1 sets - 3 reps - 20 sec hold - Seated Toe Raise  - 1 x daily - 7 x weekly - 2 sets - 10 reps - Heel Toe Raises with Counter Support  - 1 x daily - 7 x weekly - 2 sets - 10 reps - Seated Ankle Eversion with Resistance  - 1 x daily - 7 x weekly - 2 sets - 10 reps - Ankle Dorsiflexion with Resistance  - 1 x daily - 7 x weekly - 2 sets - 10 reps - Supine Posterior Pelvic Tilt  - 1 x daily - 7 x weekly - 3 sets - 10 reps - Supine 90/90 Alternating Heel Touches with Posterior Pelvic Tilt  - 1 x daily - 7 x weekly - 3 sets - 10 reps - Supine Dead Bug with Leg Extension  - 1 x daily - 7 x weekly - 3 sets - 10 reps ASSESSMENT:  CLINICAL IMPRESSION: Mr Leinendecker's last approved visit was today.  Based on his objective findings, he appears to have met max rehab potential.  We discussed DC plan and options for intervention that he could discuss with his doctor or be more consistent with his HEP and going to the gym.  He understands and will make some decisions on which direction he will take with this.  He made some gains in strength and ability to perform SLS indicating good co-contraction and stability throughout the left  LE and improved proprioception in the left LE.  He continues, however, to experience numbness in both legs.  This does not seem to have changed since his initial visit in September.  His motor loss seems to have stabilized and foot drop is not as frequent.  We reviewed HEP and updated for DC.    OBJECTIVE IMPAIRMENTS decreased balance, difficulty walking, decreased strength, impaired flexibility, postural dysfunction, and pain.   ACTIVITY LIMITATIONS locomotion level  PARTICIPATION LIMITATIONS: community activity  PERSONAL FACTORS 1 comorbidity: lumbar spondylolisthesis  are  also affecting patient's functional outcome.   REHAB POTENTIAL: Good  CLINICAL DECISION MAKING: Evolving/moderate complexity  EVALUATION COMPLEXITY: Moderate   GOALS: Goals reviewed with patient? Yes  SHORT TERM GOALS: Target date: 08/06/2022  Pt will be independent with initial HEP. Baseline: Goal status: MET on 08/06/2022  2.  Pt will be able to perform heel walking in clinic with UE support. Baseline:  Goal status: MET on 08/08/2022 following e-stim    LONG TERM GOALS: Target date: 11/09/2022  Pt will be independent with advanced HEP. Baseline:  Goal status: MET  2.  Pt will be able to maintain right single leg stance for at least 30 sec and LLE for at least 15 sec to decrease his risk of falling. Baseline: 19 sec RLE, 1.8 sec LLE Goal status: MET on 09/19/2022  3.  Pt will increase LEFS to at least 90% to demonstrate improvement in functional mobility. Baseline: 80% Goal status: NOT MET  4.  Pt will increase bilateral dorsiflexion strength to at least 4+/5 to allow him to ambulate with decreased foot drop. Baseline:  Goal status: NOT MET  5.  Pt will report ability to play golf with no immediate increase in back pain. Baseline:  Goal status: MET able to play for short periods but due to other issues, has not returned to playing consistently    PLAN: PT FREQUENCY: 2x/week  PT DURATION: 8 weeks  PLANNED INTERVENTIONS: Therapeutic exercises, Therapeutic activity, Neuromuscular re-education, Balance training, Gait training, Patient/Family education, Self Care, Joint mobilization, Joint manipulation, Stair training, Aquatic Therapy, Dry Needling, Electrical stimulation, Spinal manipulation, Spinal mobilization, Cryotherapy, Moist heat, Taping, Vasopneumatic device, Traction, Ultrasound, Ionotophoresis 24m/ml Dexamethasone, Manual therapy, and Re-evaluation.  PLAN FOR NEXT SESSION: We will DC at this time.    JAnderson MaltaB. Fields, PT 10/24/22 9:49 AM   [address] [place]  [address]Phone # [phone] [phone]

## 2022-10-25 ENCOUNTER — Ambulatory Visit: Payer: Medicare PPO

## 2022-11-01 ENCOUNTER — Ambulatory Visit: Payer: Medicare PPO

## 2022-11-08 ENCOUNTER — Encounter: Payer: Medicare PPO | Admitting: Rehabilitative and Restorative Service Providers"

## 2022-11-21 DIAGNOSIS — M4310 Spondylolisthesis, site unspecified: Secondary | ICD-10-CM | POA: Diagnosis not present

## 2022-11-21 DIAGNOSIS — E785 Hyperlipidemia, unspecified: Secondary | ICD-10-CM | POA: Diagnosis not present

## 2022-11-21 DIAGNOSIS — R7309 Other abnormal glucose: Secondary | ICD-10-CM | POA: Diagnosis not present

## 2022-11-21 DIAGNOSIS — Z0000 Encounter for general adult medical examination without abnormal findings: Secondary | ICD-10-CM | POA: Diagnosis not present

## 2022-11-21 DIAGNOSIS — I10 Essential (primary) hypertension: Secondary | ICD-10-CM | POA: Diagnosis not present

## 2022-12-10 DIAGNOSIS — U071 COVID-19: Secondary | ICD-10-CM | POA: Diagnosis not present

## 2022-12-10 DIAGNOSIS — I10 Essential (primary) hypertension: Secondary | ICD-10-CM | POA: Diagnosis not present

## 2022-12-27 DIAGNOSIS — M5416 Radiculopathy, lumbar region: Secondary | ICD-10-CM | POA: Diagnosis not present

## 2022-12-27 DIAGNOSIS — M4316 Spondylolisthesis, lumbar region: Secondary | ICD-10-CM | POA: Diagnosis not present

## 2022-12-27 DIAGNOSIS — Z6830 Body mass index (BMI) 30.0-30.9, adult: Secondary | ICD-10-CM | POA: Diagnosis not present

## 2023-02-21 ENCOUNTER — Ambulatory Visit: Payer: Medicare PPO | Admitting: Cardiology

## 2023-04-09 ENCOUNTER — Telehealth: Payer: Self-pay | Admitting: Physician Assistant

## 2023-04-09 DIAGNOSIS — M5416 Radiculopathy, lumbar region: Secondary | ICD-10-CM | POA: Diagnosis not present

## 2023-04-09 NOTE — Telephone Encounter (Signed)
     Pre-operative Risk Assessment    Patient Name: Todd Kane  DOB: Mar 28, 1956 MRN: [id number]      Request for Surgical Clearance    Procedure:   Lumbar fusion   Date of Surgery:  Clearance TBD                                 Surgeon:  Dr. Marikay Alar Surgeon's Group or Practice Name:  [facility]  Phone number:  [phone] Fax number:  [phone]   Type of Clearance Requested:   - Medical    Type of Anesthesia:  General    Additional requests/questions:    Signed, [name]   04/09/2023, 10:45 AM

## 2023-04-09 NOTE — Telephone Encounter (Signed)
Patient is scheduled for a preoperative risk assessment and follow up with Tereso Newcomer, PA-C tomorrow 6/5. Will remove from pool.   Etta Grandchild. Fotini Lemus, DNP, NP-C  04/09/2023, 3:58 PM [facility] [address] Office [phone] Fax [phone]

## 2023-04-10 ENCOUNTER — Encounter: Payer: Self-pay | Admitting: Physician Assistant

## 2023-04-10 ENCOUNTER — Ambulatory Visit: Payer: Medicare PPO | Attending: Physician Assistant | Admitting: Physician Assistant

## 2023-04-10 VITALS — BP 144/86 | HR 53 | Ht 70.0 in | Wt 212.0 lb

## 2023-04-10 DIAGNOSIS — E7800 Pure hypercholesterolemia, unspecified: Secondary | ICD-10-CM

## 2023-04-10 DIAGNOSIS — I2510 Atherosclerotic heart disease of native coronary artery without angina pectoris: Secondary | ICD-10-CM

## 2023-04-10 DIAGNOSIS — Z01810 Encounter for preprocedural cardiovascular examination: Secondary | ICD-10-CM | POA: Insufficient documentation

## 2023-04-10 DIAGNOSIS — I10 Essential (primary) hypertension: Secondary | ICD-10-CM

## 2023-04-10 NOTE — Assessment & Plan Note (Signed)
Moderate nonobstructive disease by coronary CTA in 2018.  Myoview in 2020 low risk.  He is doing well without chest pain to suggest angina.  Continue ASA 81 mg daily, Lipitor 40 mg daily, Zetia 10 mg daily.  Follow-up 1 year.  He was previously seen by Dr. Delton See and was to establish with Dr. Shari Prows.  I will have him follow-up with Dr. Cindee Lame going forward.

## 2023-04-10 NOTE — Assessment & Plan Note (Addendum)
Mr. Brickley's perioperative risk of a major cardiac event is 0.4% according to the Revised Cardiac Risk Index (RCRI).  Therefore, he is at low risk for perioperative complications.   His functional capacity is good at 4.31 METs according to the Duke Activity Status Index (DASI). Recommendations: According to ACC/AHA guidelines, no further cardiovascular testing needed.  The patient may proceed to surgery at acceptable risk.   Antiplatelet and/or Anticoagulation Recommendations: Aspirin can be held for 7 days prior to his surgery.  Please resume Aspirin post operatively when it is felt to be safe from a bleeding standpoint.

## 2023-04-10 NOTE — Assessment & Plan Note (Signed)
Continue Lipitor 40 mg daily, Zetia 10 mg daily.  Obtain most recent lipid panel from the [place].

## 2023-04-10 NOTE — Progress Notes (Signed)
Cardiology Office Note:    Date:  04/10/2023  ID:  Todd Kane, DOB [birth year], MRN [id number] PCP: Farris Has, MD  [facility] Providers Cardiologist:  Tobias Alexander, MD Cardiology APP:  Dyann Kief, PA-C       Patient Profile:      Coronary artery disease Nonobstructive   CCTA 03/08/2017: CAC score 107 (61st percentile); moderate nonobstructive plaque - distal RCA 0-25, proximal LAD 25-50, prox-mid LAD 50-69, mid LAD 25-50, D1 25-50, LCx mid 0-25 Myoview 12/16/2018: No ischemia, EF 63 TTE 09/15/2020: EF 55-60, no RWMA, normal RVSF, trivial MR, RAP 3 Hypertension  Hyperlipidemia  Pre-diabetes  Palpitations   Supraventricular Tachycardia  Monitor 08/2020: NSVT x 1 (4 beats); 24 short runs SVT (longest 9.3 seconds) Aortic atherosclerosis  NSVT OSA       History of Present Illness:   Todd Kane is a 67 y.o. male who returns for surgical clearance.  He was last seen 01/23/2022 by Dayton Scrape, PA-C.  He needs lumbar spine fusion with Dr. Yetta Barre under general anesthesia.  He is here alone.  He remains active.  He exercises on the elliptical for about 20 minutes several times a week.  He has not had chest pain, significant shortness of breath, syncope, orthopnea.  He does have some ankle edema.  Review of Systems  Neurological:  Positive for focal weakness.   see the HPI    Studies Reviewed:    EKG: Sinus bradycardia, HR 53, normal axis, no ST-T wave changes, QTc 431   Risk Assessment/Calculations:           Physical Exam:   VS:  BP (!) 144/86   Pulse (!) 53   Ht 5\' 10"  (1.778 m)   Wt 212 lb (96.2 kg)   SpO2 98%   BMI 30.42 kg/m    Wt Readings from Last 3 Encounters:  04/10/23 212 lb (96.2 kg)  01/23/22 203 lb (92.1 kg)  01/17/21 210 lb (95.3 kg)    Constitutional:      Appearance: Healthy appearance. Not in distress.  Neck:     Vascular: No carotid bruit. JVD normal.  Pulmonary:     Breath sounds: Normal breath sounds. No wheezing. No  rales.  Cardiovascular:     Normal rate. Regular rhythm.     Murmurs: There is no murmur.  Edema:    Peripheral edema absent.  Abdominal:     Palpations: Abdomen is soft.        ASSESSMENT AND PLAN:   Preoperative cardiovascular examination Mr. Pfaff perioperative risk of a major cardiac event is 0.4% according to the Revised Cardiac Risk Index (RCRI).  Therefore, he is at low risk for perioperative complications.   His functional capacity is good at 4.31 METs according to the Duke Activity Status Index (DASI). Recommendations: According to ACC/AHA guidelines, no further cardiovascular testing needed.  The patient may proceed to surgery at acceptable risk.   Antiplatelet and/or Anticoagulation Recommendations: Aspirin can be held for 7 days prior to his surgery.  Please resume Aspirin post operatively when it is felt to be safe from a bleeding standpoint.   Coronary artery calcification seen on CAT scan Moderate nonobstructive disease by coronary CTA in 2018.  Myoview in 2020 low risk.  He is doing well without chest pain to suggest angina.  Continue ASA 81 mg daily, Lipitor 40 mg daily, Zetia 10 mg daily.  Follow-up 1 year.  He was previously seen by Dr. Delton See and was to establish with  Dr. Shari Prows.  I will have him follow-up with Dr. Cindee Lame going forward.  Essential hypertension Blood pressure above target today.  It is usually well-controlled.  Continue to monitor.  Continue losartan/HCTZ 100/12.5 mg daily.  Hypercholesterolemia Continue Lipitor 40 mg daily, Zetia 10 mg daily.  Obtain most recent lipid panel from the [place].       Dispo:  Return in about 1 year (around 04/09/2024) for Routine Follow Up with Dr. Raynelle Jan, or Tereso Newcomer, PA-C.  Signed, Tereso Newcomer, PA-C

## 2023-04-10 NOTE — Patient Instructions (Signed)
Medication Instructions:  Your physician recommends that you continue on your current medications as directed. Please refer to the Current Medication list given to you today.  *If you need a refill on your cardiac medications before your next appointment, please call your pharmacy*   Lab Work: None ordered  If you have labs (blood work) drawn today and your tests are completely normal, you will receive your results only by: MyChart Message (if you have MyChart) OR A paper copy in the mail If you have any lab test that is abnormal or we need to change your treatment, we will call you to review the results.   Testing/Procedures: None ordered   Follow-Up: At [facility], you and your health needs are our priority.  As part of our continuing mission to provide you with exceptional heart care, we have created designated Provider Care Teams.  These Care Teams include your primary Cardiologist (physician) and Advanced Practice Providers (APPs -  Physician Assistants and Nurse Practitioners) who all work together to provide you with the care you need, when you need it.  We recommend signing up for the patient portal called "MyChart".  Sign up information is provided on this After Visit Summary.  MyChart is used to connect with patients for Virtual Visits (Telemedicine).  Patients are able to view lab/test results, encounter notes, upcoming appointments, etc.  Non-urgent messages can be sent to your provider as well.   To learn more about what you can do with MyChart, go to [email or web address].    Your next appointment:   12 month(s)  Provider:   Riley Lam, MD     Other Instructions

## 2023-04-10 NOTE — Assessment & Plan Note (Signed)
Blood pressure above target today.  It is usually well-controlled.  Continue to monitor.  Continue losartan/HCTZ 100/12.5 mg daily.

## 2023-04-11 DIAGNOSIS — M5416 Radiculopathy, lumbar region: Secondary | ICD-10-CM | POA: Diagnosis not present

## 2023-04-11 DIAGNOSIS — M4316 Spondylolisthesis, lumbar region: Secondary | ICD-10-CM | POA: Diagnosis not present

## 2023-04-11 DIAGNOSIS — M545 Low back pain, unspecified: Secondary | ICD-10-CM | POA: Diagnosis not present

## 2023-04-11 DIAGNOSIS — R200 Anesthesia of skin: Secondary | ICD-10-CM | POA: Diagnosis not present

## 2023-04-13 DIAGNOSIS — M5417 Radiculopathy, lumbosacral region: Secondary | ICD-10-CM | POA: Diagnosis not present

## 2023-04-16 DIAGNOSIS — M4316 Spondylolisthesis, lumbar region: Secondary | ICD-10-CM | POA: Diagnosis not present

## 2023-04-16 DIAGNOSIS — Z6830 Body mass index (BMI) 30.0-30.9, adult: Secondary | ICD-10-CM | POA: Diagnosis not present

## 2023-04-16 DIAGNOSIS — M5416 Radiculopathy, lumbar region: Secondary | ICD-10-CM | POA: Diagnosis not present

## 2023-04-17 ENCOUNTER — Other Ambulatory Visit: Payer: Self-pay | Admitting: Neurological Surgery

## 2023-04-25 ENCOUNTER — Other Ambulatory Visit (HOSPITAL_COMMUNITY): Payer: Medicare PPO

## 2023-04-25 NOTE — Pre-Procedure Instructions (Signed)
Surgical Instructions    Your procedure is scheduled on May 06, 2023.  Report to [facility] Main Entrance "A" at 6:45 A.M., then check in with the Admitting office.  Call this number if you have problems the morning of surgery:  [phone]  If you have any questions prior to your surgery date call [phone]: Open Monday-Friday 8am-4pm If you experience any cold or flu symptoms such as cough, fever, chills, shortness of breath, etc. between now and your scheduled surgery, please notify us at the above number.     Remember:  Do not eat or drink after midnight the night before your surgery     Take these medicines the morning of surgery with A SIP OF WATER:  alfuzosin (UROXATRAL)   atorvastatin (LIPITOR)   ezetimibe (ZETIA)   finasteride (PROSCAR)    Follow your surgeon's instructions on when to stop Aspirin.  If no instructions were given by your surgeon then you will need to call the office to get those instructions.     As of today, STOP taking any Aleve, Naproxen, Ibuprofen, Motrin, Advil, Goody's, BC's, all herbal medications, fish oil, and all vitamins.                     Do NOT Smoke (Tobacco/Vaping) for 24 hours prior to your procedure.  If you use a CPAP at night, you may bring your mask/headgear for your overnight stay.   Contacts, glasses, piercing's, hearing aid's, dentures or partials may not be worn into surgery, please bring cases for these belongings.    For patients admitted to the hospital, discharge time will be determined by your treatment team.   Patients discharged the day of surgery will not be allowed to drive home, and someone needs to stay with them for 24 hours.  SURGICAL WAITING ROOM VISITATION Patients having surgery or a procedure may have no more than 2 support people in the waiting area - these visitors may rotate.   Children under the age of 81 must have an adult with them who is not the patient. If the patient needs to stay at the  hospital during part of their recovery, the visitor guidelines for inpatient rooms apply. Pre-op nurse will coordinate an appropriate time for 1 support person to accompany patient in pre-op.  This support person may not rotate.   Please refer to the [facility] website for the visitor guidelines for Inpatients (after your surgery is over and you are in a regular room).   If you received a COVID test during your pre-op visit  it is requested that you wear a mask when out in public, stay away from anyone that may not be feeling well and notify your surgeon if you develop symptoms. If you have been in contact with anyone that has tested positive in the last 10 days please notify you surgeon.    Pre-operative 5 CHG Bath Instructions   You can play a key role in reducing the risk of infection after surgery. Your skin needs to be as free of germs as possible. You can reduce the number of germs on your skin by washing with CHG (chlorhexidine gluconate) soap before surgery. CHG is an antiseptic soap that kills germs and continues to kill germs even after washing.   DO NOT use if you have an allergy to chlorhexidine/CHG or antibacterial soaps. If your skin becomes reddened or irritated, stop using the CHG and notify one of our RNs at [phone].   Please  shower with the CHG soap starting 4 days before surgery using the following schedule:     Please keep in mind the following:  DO NOT shave, including legs and underarms, starting the day of your first shower.   You may shave your face at any point before/day of surgery.  Place clean sheets on your bed the day you start using CHG soap. Use a clean washcloth (not used since being washed) for each shower. DO NOT sleep with pets once you start using the CHG.   CHG Shower Instructions:  If you choose to wash your hair and private area, wash first with your normal shampoo/soap.  After you use shampoo/soap, rinse your hair and body thoroughly to remove  shampoo/soap residue.  Turn the water OFF and apply about 3 tablespoons (45 ml) of CHG soap to a CLEAN washcloth.  Apply CHG soap ONLY FROM YOUR NECK DOWN TO YOUR TOES (washing for 3-5 minutes)  DO NOT use CHG soap on face, private areas, open wounds, or sores.  Pay special attention to the area where your surgery is being performed.  If you are having back surgery, having someone wash your back for you may be helpful. Wait 2 minutes after CHG soap is applied, then you may rinse off the CHG soap.  Pat dry with a clean towel  Put on clean clothes/pajamas   If you choose to wear lotion, please use ONLY the CHG-compatible lotions on the back of this paper.     Additional instructions for the day of surgery: DO NOT APPLY any lotions, deodorants, cologne, or perfumes.   Do not wear jewelry or makeup Do not wear nail polish, gel polish, artificial nails, or any other type of covering on natural nails (fingers and toes) Do not bring valuables to the hospital. [facility] is not responsible for any belongings or valuables. Put on clean/comfortable clothes.  Brush your teeth.  Ask your nurse before applying any prescription medications to the skin.      CHG Compatible Lotions   Aveeno Moisturizing lotion  Cetaphil Moisturizing Cream  Cetaphil Moisturizing Lotion  Clairol Herbal Essence Moisturizing Lotion, Dry Skin  Clairol Herbal Essence Moisturizing Lotion, Extra Dry Skin  Clairol Herbal Essence Moisturizing Lotion, Normal Skin  Curel Age Defying Therapeutic Moisturizing Lotion with Alpha Hydroxy  Curel Extreme Care Body Lotion  Curel Soothing Hands Moisturizing Hand Lotion  Curel Therapeutic Moisturizing Cream, Fragrance-Free  Curel Therapeutic Moisturizing Lotion, Fragrance-Free  Curel Therapeutic Moisturizing Lotion, Original Formula  Eucerin Daily Replenishing Lotion  Eucerin Dry Skin Therapy Plus Alpha Hydroxy Crme  Eucerin Dry Skin Therapy Plus Alpha Hydroxy Lotion  Eucerin  Original Crme  Eucerin Original Lotion  Eucerin Plus Crme Eucerin Plus Lotion  Eucerin TriLipid Replenishing Lotion  Keri Anti-Bacterial Hand Lotion  Keri Deep Conditioning Original Lotion Dry Skin Formula Softly Scented  Keri Deep Conditioning Original Lotion, Fragrance Free Sensitive Skin Formula  Keri Lotion Fast Absorbing Fragrance Free Sensitive Skin Formula  Keri Lotion Fast Absorbing Softly Scented Dry Skin Formula  Keri Original Lotion  Keri Skin Renewal Lotion Keri Silky Smooth Lotion  Keri Silky Smooth Sensitive Skin Lotion  Nivea Body Creamy Conditioning Oil  Nivea Body Extra Enriched Teacher, adult education Moisturizing Lotion Nivea Crme  Nivea Skin Firming Lotion  NutraDerm 30 Skin Lotion  NutraDerm Skin Lotion  NutraDerm Therapeutic Skin Cream  NutraDerm Therapeutic Skin Lotion  ProShield Protective Hand Cream  Provon moisturizing lotion   Please  read over the following fact sheets that you were given.

## 2023-04-26 ENCOUNTER — Encounter (HOSPITAL_COMMUNITY): Payer: Self-pay

## 2023-04-26 ENCOUNTER — Encounter (HOSPITAL_COMMUNITY)
Admission: RE | Admit: 2023-04-26 | Discharge: 2023-04-26 | Disposition: A | Discharge status: Home / Self Care | Payer: Medicare PPO | Source: Ambulatory Visit | Attending: Neurological Surgery | Admitting: Neurological Surgery

## 2023-04-26 ENCOUNTER — Other Ambulatory Visit: Payer: Self-pay

## 2023-04-26 VITALS — BP 145/86 | HR 58 | Temp 98.3°F | Resp 18 | Ht 70.0 in | Wt 212.9 lb

## 2023-04-26 DIAGNOSIS — R7303 Prediabetes: Secondary | ICD-10-CM | POA: Insufficient documentation

## 2023-04-26 DIAGNOSIS — G4733 Obstructive sleep apnea (adult) (pediatric): Secondary | ICD-10-CM | POA: Diagnosis not present

## 2023-04-26 DIAGNOSIS — I10 Essential (primary) hypertension: Secondary | ICD-10-CM | POA: Diagnosis not present

## 2023-04-26 DIAGNOSIS — Z01812 Encounter for preprocedural laboratory examination: Secondary | ICD-10-CM | POA: Insufficient documentation

## 2023-04-26 DIAGNOSIS — I2510 Atherosclerotic heart disease of native coronary artery without angina pectoris: Secondary | ICD-10-CM | POA: Insufficient documentation

## 2023-04-26 DIAGNOSIS — Z01818 Encounter for other preprocedural examination: Secondary | ICD-10-CM

## 2023-04-26 LAB — BASIC METABOLIC PANEL
Anion gap: 7 (ref 5–15)
BUN: 27 mg/dL — ABNORMAL HIGH (ref 8–23)
CO2: 29 mmol/L (ref 22–32)
Calcium: 9.2 mg/dL (ref 8.9–10.3)
Chloride: 104 mmol/L (ref 98–111)
Creatinine, Ser: 1.22 mg/dL (ref 0.61–1.24)
GFR, Estimated: >60 mL/min (ref >60)
Glucose, Bld: 79 mg/dL (ref 70–99)
Potassium: 3.7 mmol/L (ref 3.5–5.1)
Sodium: 140 mmol/L (ref 135–145)

## 2023-04-26 LAB — CBC
HCT: 44.1 % (ref 39.0–52.0)
Hemoglobin: 14.5 g/dL (ref 13.0–17.0)
MCH: 30.5 pg (ref 26.0–34.0)
MCHC: 32.9 g/dL (ref 30.0–36.0)
MCV: 92.6 fL (ref 80.0–100.0)
Platelets: 193 10*3/uL (ref 150–400)
RBC: 4.76 MIL/uL (ref 4.22–5.81)
RDW: 12.9 % (ref 11.5–15.5)
WBC: 4.4 10*3/uL (ref 4.0–10.5)
nRBC: 0 % (ref 0.0–0.2)

## 2023-04-26 LAB — TYPE AND SCREEN
ABO/RH(D): B POS
Antibody Screen: NEGATIVE

## 2023-04-26 LAB — PROTIME-INR
INR: 1 (ref 0.8–1.2)
Prothrombin Time: 13.6 seconds (ref 11.4–15.2)

## 2023-04-26 LAB — SURGICAL PCR SCREEN
MRSA, PCR: NEGATIVE
Staphylococcus aureus: NEGATIVE

## 2023-04-26 NOTE — Progress Notes (Signed)
PCP - Farris Has, MD Cardiologist - Beatrice Lecher, PA-C;  Tobias Alexander, MD   PPM/ICD - denies Device Orders - n/a Rep Notified - n/a  Chest x-ray - denies EKG - 04/10/2023 Stress Test - 12/16/2018 ECHO - 09/15/2020 Cardiac Cath - denies  Sleep Study - yes CPAP - no  Fasting Blood Sugar - no DM   Last dose of GLP1 agonist-  n/a GLP1 instructions: n/a  Blood Thinner Instructions: n/a Aspirin Instructions: hold aspirin for 7 days prior to procedure (per cardiologist's note.) Last dose 04/28/2023  ERAS Protcol - no; NPO after midnight PRE-SURGERY Ensure or G2- no  COVID TEST- no   Anesthesia review: yes (hx of heart disease/ cardiac clearance)  Patient denies shortness of breath, fever, cough and chest pain at PAT appointment and the last 2 months.    All instructions explained to the patient, with a verbal understanding of the material. Patient agrees to go over the instructions while at home for a better understanding. Patient also instructed to self quarantine after being tested for COVID-19. The opportunity to ask questions was provided.

## 2023-04-29 DIAGNOSIS — M5416 Radiculopathy, lumbar region: Secondary | ICD-10-CM | POA: Diagnosis not present

## 2023-04-30 ENCOUNTER — Encounter (HOSPITAL_COMMUNITY): Payer: Self-pay

## 2023-04-30 NOTE — Anesthesia Preprocedure Evaluation (Addendum)
Anesthesia Evaluation  Patient identified by MRN, date of birth, ID band Patient awake    Reviewed: Allergy & Precautions, NPO status , Patient's Chart, lab work & pertinent test results  History of Anesthesia Complications Negative for: history of anesthetic complications  Airway Mallampati: II  TM Distance: >3 FB Neck ROM: Full    Dental  (+) Teeth Intact, Dental Advisory Given   Pulmonary neg shortness of breath, sleep apnea , neg COPD, neg recent URI   breath sounds clear to auscultation       Cardiovascular hypertension, Pt. on medications + CAD  (-) CHF (-) dysrhythmias  Rhythm:Regular   1. Left ventricular ejection fraction, by estimation, is 55 to 60%. The  left ventricle has normal function. The left ventricle has no regional  wall motion abnormalities. Left ventricular diastolic parameters were  normal.   2. Right ventricular systolic function is normal. The right ventricular  size is normal. Tricuspid regurgitation signal is inadequate for assessing  PA pressure.   3. The mitral valve is normal in structure. Trivial mitral valve  regurgitation. No evidence of mitral stenosis.   4. The aortic valve is tricuspid. Aortic valve regurgitation is not  visualized. No aortic stenosis is present.   5. The inferior vena cava is normal in size with greater than 50%  respiratory variability, suggesting right atrial pressure of 3 mmHg.     The left ventricular ejection fraction is normal (55-65%).  Nuclear stress EF: 63%.  Blood pressure demonstrated a normal response to exercise.  There was no ST segment deviation noted during stress.  The study is normal.  This is a low risk study.   Normal stress nuclear study with no ischemia or infarction.  Gated ejection fraction 63% with normal wall motion.    Neuro/Psych neg Seizures  Neuromuscular disease  negative psych ROS   GI/Hepatic negative GI ROS, Neg liver ROS,,,   Endo/Other  negative endocrine ROS    Renal/GU negative Renal ROS     Musculoskeletal negative musculoskeletal ROS (+)    Abdominal   Peds  Hematology negative hematology ROS (+) Lab Results      Component                Value               Date                      WBC                      4.4                 04/26/2023                HGB                      14.5                04/26/2023                HCT                      44.1                04/26/2023                MCV  92.6                04/26/2023                PLT                      193                 04/26/2023              Anesthesia Other Findings   Reproductive/Obstetrics                             Anesthesia Physical Anesthesia Plan  ASA: 2  Anesthesia Plan: General   Post-op Pain Management: Tylenol PO (pre-op)* and Gabapentin PO (pre-op)*   Induction: Intravenous  PONV Risk Score and Plan: 2 and Ondansetron  Airway Management Planned: Oral ETT  Additional Equipment: None  Intra-op Plan:   Post-operative Plan: Extubation in OR  Informed Consent: I have reviewed the patients History and Physical, chart, labs and discussed the procedure including the risks, benefits and alternatives for the proposed anesthesia with the patient or authorized representative who has indicated his/her understanding and acceptance.     Dental advisory given  Plan Discussed with: CRNA  Anesthesia Plan Comments: (See PAT note from 6/21 by Sherlie Ban PA-C )        Anesthesia Quick Evaluation

## 2023-04-30 NOTE — Progress Notes (Signed)
Choose an anesthesia record to view details        DISCUSSION: Todd Kane is a 67 year old male who presents to PAT prior to PLIF on 05/06/2023 with Dr. Marikay Alar.  Past medical history significant for hypertension, OSA, coronary artery disease, prediabetes.  No prior anesthesia complications  Last saw cardiology on 04/10/2023 for preop risk assessment and clearance.  He follows with cardiology yearly for coronary artery calcification seen on CT scan which is medically managed.  Has had low risk stress testing in the past.  Noted to have good functional status without any chest pain or shortness of breath. Advised to f/u in 1 year:  "Preoperative cardiovascular examination Mr. Guettler perioperative risk of a major cardiac event is 0.4% according to the Revised Cardiac Risk Index (RCRI).  Therefore, he is at low risk for perioperative complications.   His functional capacity is good at 4.31 METs according to the Duke Activity Status Index (DASI). Recommendations: According to ACC/AHA guidelines, no further cardiovascular testing needed.  The patient may proceed to surgery at acceptable risk.   Antiplatelet and/or Anticoagulation Recommendations: Aspirin can be held for 7 days prior to his surgery.  Please resume Aspirin post operatively when it is felt to be safe from a bleeding standpoint."   VS: BP (!) 145/86   Pulse (!) 58   Temp 36.8 C (Oral)   Resp 18   Ht 5\' 10"  (1.778 m)   Wt 96.6 kg   SpO2 98%   BMI 30.55 kg/m   PROVIDERS: Farris Has, MD Cardiology: Tobias Alexander, MD  LABS: Labs reviewed: Acceptable for surgery. (all labs ordered are listed, but only abnormal results are displayed)  Labs Reviewed  BASIC METABOLIC PANEL - Abnormal; Notable for the following components:      Result Value   BUN 27 (*)    All other components within normal limits  SURGICAL PCR SCREEN  PROTIME-INR  CBC  TYPE AND SCREEN     IMAGES:  CT Coronary  03/08/2017:  IMPRESSION: 1. Coronary calcium score of 107. This was 28 percentile for age and sex matched control.   2. Normal coronary origin with right dominance.   3. Moderate nonobstructive plaque in proximal to mid LAD. An aggressive risk factor modification is recommended.   EKG 04/10/23:  Sinus bradycardia, rate 53   CV:  Echo 09/15/2020:   IMPRESSIONS     1. Left ventricular ejection fraction, by estimation, is 55 to 60%. The  left ventricle has normal function. The left ventricle has no regional  wall motion abnormalities. Left ventricular diastolic parameters were  normal.   2. Right ventricular systolic function is normal. The right ventricular  size is normal. Tricuspid regurgitation signal is inadequate for assessing  PA pressure.   3. The mitral valve is normal in structure. Trivial mitral valve  regurgitation. No evidence of mitral stenosis.   4. The aortic valve is tricuspid. Aortic valve regurgitation is not  visualized. No aortic stenosis is present.   5. The inferior vena cava is normal in size with greater than 50%  respiratory variability, suggesting right atrial pressure of 3 mmHg.   Holter monitor 08/25/2020:  Patient had a min HR of 39 bpm, max HR of 210 bpm, and avg HR of 57 bpm. 1 run of Ventricular Tachycardia occurred lasting 4 beats with a max rate of 210 bpm. 24 short runs of Supraventricular Tachycardia runs occurred.   Predominant underlying rhythm was Sinus Rhythm.  1 run of Ventricular Tachycardia  occurred lasting 4 beats. 24 short runs of Supraventricular Tachycardia runs occurred, the longest lasting 9.3 secs. Frequent Ventricular Bigeminy and Trigeminy were present.   Patient's symptoms correlated with SVTs. Please obtain an echocardiogram.  NM Stress test 12/16/2018:  The left ventricular ejection fraction is normal (55-65%). Nuclear stress EF: 63%. Blood pressure demonstrated a normal response to exercise. There was no ST  segment deviation noted during stress. The study is normal. This is a low risk study.   Normal stress nuclear study with no ischemia or infarction.  Gated ejection fraction 63% with normal wall motion.  Past Medical History:  Diagnosis Date   Back pain    BPH (benign prostatic hyperplasia)    CAD (coronary artery disease)    a. mild-mod by cardiac CT 03/2017.   Chest pain    Hyperlipidemia    Hypertension    OSA (obstructive sleep apnea) 2013   Pre-diabetes    Spondylolisthesis     Past Surgical History:  Procedure Laterality Date   MOUTH SURGERY  1982   3rd molars   TONSILLECTOMY AND ADENOIDECTOMY  1959    MEDICATIONS:  alfuzosin (UROXATRAL) 10 MG 24 hr tablet   aspirin 81 MG tablet   atorvastatin (LIPITOR) 40 MG tablet   B Complex-C (SUPER B-COMPLEX + VITAMIN C) TABS   cholecalciferol (VITAMIN D) 1000 UNITS tablet   Coenzyme Q10 (COQ-10 PO)   ezetimibe (ZETIA) 10 MG tablet   finasteride (PROSCAR) 5 MG tablet   losartan-hydrochlorothiazide (HYZAAR) 100-12.5 MG tablet   sildenafil (VIAGRA) 50 MG tablet   vitamin B-12 (CYANOCOBALAMIN) 1000 MCG tablet   vitamin E 180 MG (400 UNITS) capsule   No current facility-administered medications for this encounter.   Marcille Blanco [facility] Surgical Short Stay/Anesthesiology [facility] Phone [phone] 04/30/2023 1:23 PM

## 2023-05-06 ENCOUNTER — Encounter (HOSPITAL_COMMUNITY): Payer: Self-pay | Admitting: Neurological Surgery

## 2023-05-06 ENCOUNTER — Inpatient Hospital Stay (HOSPITAL_BASED_OUTPATIENT_CLINIC_OR_DEPARTMENT_OTHER): Payer: Medicare PPO

## 2023-05-06 ENCOUNTER — Encounter (HOSPITAL_COMMUNITY)
Admission: RE | Disposition: A | Discharge status: Home / Self Care | Payer: Self-pay | Source: Home / Self Care | Attending: Neurological Surgery

## 2023-05-06 ENCOUNTER — Inpatient Hospital Stay (HOSPITAL_COMMUNITY): Payer: Medicare PPO

## 2023-05-06 ENCOUNTER — Inpatient Hospital Stay (HOSPITAL_COMMUNITY): Payer: Medicare PPO | Admitting: Medical

## 2023-05-06 ENCOUNTER — Other Ambulatory Visit: Payer: Self-pay

## 2023-05-06 ENCOUNTER — Observation Stay (HOSPITAL_COMMUNITY)
Admission: RE | Admit: 2023-05-06 | Discharge: 2023-05-07 | Disposition: A | Discharge status: Home / Self Care | Payer: Medicare PPO | Attending: Neurological Surgery | Admitting: Neurological Surgery

## 2023-05-06 DIAGNOSIS — M5416 Radiculopathy, lumbar region: Secondary | ICD-10-CM | POA: Diagnosis not present

## 2023-05-06 DIAGNOSIS — Z7982 Long term (current) use of aspirin: Secondary | ICD-10-CM | POA: Insufficient documentation

## 2023-05-06 DIAGNOSIS — Z981 Arthrodesis status: Principal | ICD-10-CM

## 2023-05-06 DIAGNOSIS — I10 Essential (primary) hypertension: Secondary | ICD-10-CM | POA: Diagnosis not present

## 2023-05-06 DIAGNOSIS — Z79899 Other long term (current) drug therapy: Secondary | ICD-10-CM | POA: Insufficient documentation

## 2023-05-06 DIAGNOSIS — M4316 Spondylolisthesis, lumbar region: Principal | ICD-10-CM | POA: Insufficient documentation

## 2023-05-06 DIAGNOSIS — M48061 Spinal stenosis, lumbar region without neurogenic claudication: Secondary | ICD-10-CM | POA: Diagnosis not present

## 2023-05-06 DIAGNOSIS — I2510 Atherosclerotic heart disease of native coronary artery without angina pectoris: Secondary | ICD-10-CM | POA: Insufficient documentation

## 2023-05-06 HISTORY — PX: LAMINECTOMY WITH POSTERIOR LATERAL ARTHRODESIS LEVEL 1: SHX6335

## 2023-05-06 LAB — ABO/RH: ABO/RH(D): B POS

## 2023-05-06 SURGERY — POSTERIOR LUMBAR FUSION 1 LEVEL
Anesthesia: General | Site: Back

## 2023-05-06 MED ORDER — LIDOCAINE 2% (20 MG/ML) 5 ML SYRINGE
INTRAMUSCULAR | Status: AC
  Filled 2023-05-06: qty 5

## 2023-05-06 MED ORDER — THROMBIN 20000 UNITS EX SOLR: CUTANEOUS | Status: DC | PRN

## 2023-05-06 MED ORDER — FENTANYL CITRATE (PF) 100 MCG/2ML IJ SOLN: 25.0000 ug | INTRAMUSCULAR | Status: DC | PRN

## 2023-05-06 MED ORDER — ACETAMINOPHEN 10 MG/ML IV SOLN
1000.0000 mg | Freq: Once | INTRAVENOUS | Status: DC | PRN
  Administered 2023-05-06: 1000 mg via INTRAVENOUS

## 2023-05-06 MED ORDER — HYDROMORPHONE HCL 1 MG/ML IJ SOLN: 0.5000 mg | INTRAMUSCULAR | Status: DC | PRN

## 2023-05-06 MED ORDER — ONDANSETRON HCL 4 MG PO TABS: 4.0000 mg | ORAL_TABLET | Freq: Four times a day (QID) | ORAL | Status: DC | PRN

## 2023-05-06 MED ORDER — METHOCARBAMOL 1000 MG/10ML IJ SOLN: 500.0000 mg | Freq: Four times a day (QID) | INTRAVENOUS | Status: DC | PRN

## 2023-05-06 MED ORDER — ACETAMINOPHEN 500 MG PO TABS: 1000.0000 mg | ORAL_TABLET | Freq: Once | ORAL | Status: DC | PRN

## 2023-05-06 MED ORDER — OXYCODONE HCL 5 MG/5ML PO SOLN: 5.0000 mg | Freq: Once | ORAL | Status: DC | PRN

## 2023-05-06 MED ORDER — ALFUZOSIN HCL ER 10 MG PO TB24
10.0000 mg | ORAL_TABLET | Freq: Every day | ORAL | Status: DC
  Administered 2023-05-07: 10 mg via ORAL
  Filled 2023-05-06: qty 1

## 2023-05-06 MED ORDER — EZETIMIBE 10 MG PO TABS
10.0000 mg | ORAL_TABLET | Freq: Every day | ORAL | Status: DC
  Administered 2023-05-06 – 2023-05-07 (×2): 10 mg via ORAL
  Filled 2023-05-06 (×2): qty 1

## 2023-05-06 MED ORDER — OXYCODONE HCL 5 MG PO TABS
10.0000 mg | ORAL_TABLET | ORAL | Status: DC | PRN
  Administered 2023-05-06 – 2023-05-07 (×5): 10 mg via ORAL
  Filled 2023-05-06 (×5): qty 2

## 2023-05-06 MED ORDER — PHENYLEPHRINE HCL-NACL 20-0.9 MG/250ML-% IV SOLN
INTRAVENOUS | Status: DC | PRN
  Administered 2023-05-06: 30 ug/min via INTRAVENOUS

## 2023-05-06 MED ORDER — PROPOFOL 10 MG/ML IV BOLUS
INTRAVENOUS | Status: AC
  Filled 2023-05-06: qty 20

## 2023-05-06 MED ORDER — FENTANYL CITRATE (PF) 250 MCG/5ML IJ SOLN
INTRAMUSCULAR | Status: AC
  Filled 2023-05-06: qty 5

## 2023-05-06 MED ORDER — ACETAMINOPHEN 500 MG PO TABS
1000.0000 mg | ORAL_TABLET | Freq: Four times a day (QID) | ORAL | Status: AC
  Administered 2023-05-06 – 2023-05-07 (×4): 1000 mg via ORAL
  Filled 2023-05-06 (×4): qty 2

## 2023-05-06 MED ORDER — HYDROCHLOROTHIAZIDE 12.5 MG PO TABS
12.5000 mg | ORAL_TABLET | Freq: Every day | ORAL | Status: DC
  Administered 2023-05-07: 12.5 mg via ORAL
  Filled 2023-05-06: qty 1

## 2023-05-06 MED ORDER — VITAMIN D 25 MCG (1000 UNIT) PO TABS: 2000.0000 [IU] | ORAL_TABLET | Freq: Every day | ORAL | Status: DC

## 2023-05-06 MED ORDER — ROCURONIUM BROMIDE 10 MG/ML (PF) SYRINGE
PREFILLED_SYRINGE | INTRAVENOUS | Status: DC | PRN
  Administered 2023-05-06: 20 mg via INTRAVENOUS
  Administered 2023-05-06: 70 mg via INTRAVENOUS
  Administered 2023-05-06: 10 mg via INTRAVENOUS

## 2023-05-06 MED ORDER — DEXAMETHASONE 4 MG PO TABS
4.0000 mg | ORAL_TABLET | Freq: Four times a day (QID) | ORAL | Status: DC
  Administered 2023-05-06 – 2023-05-07 (×4): 4 mg via ORAL
  Filled 2023-05-06 (×4): qty 1

## 2023-05-06 MED ORDER — BUPIVACAINE HCL (PF) 0.25 % IJ SOLN
INTRAMUSCULAR | Status: DC | PRN
  Administered 2023-05-06: 10 mL

## 2023-05-06 MED ORDER — CHLORHEXIDINE GLUCONATE CLOTH 2 % EX PADS: 6.0000 | MEDICATED_PAD | Freq: Once | CUTANEOUS | Status: DC

## 2023-05-06 MED ORDER — PROPOFOL 10 MG/ML IV BOLUS
INTRAVENOUS | Status: DC | PRN
  Administered 2023-05-06: 110 mg via INTRAVENOUS

## 2023-05-06 MED ORDER — ACETAMINOPHEN 10 MG/ML IV SOLN
INTRAVENOUS | Status: AC
  Filled 2023-05-06: qty 100

## 2023-05-06 MED ORDER — ACETAMINOPHEN 500 MG PO TABS
1000.0000 mg | ORAL_TABLET | ORAL | Status: AC
  Administered 2023-05-06: 1000 mg via ORAL
  Filled 2023-05-06: qty 2

## 2023-05-06 MED ORDER — MIDAZOLAM HCL 2 MG/2ML IJ SOLN
INTRAMUSCULAR | Status: DC | PRN
  Administered 2023-05-06: 2 mg via INTRAVENOUS

## 2023-05-06 MED ORDER — CEFAZOLIN SODIUM-DEXTROSE 2-4 GM/100ML-% IV SOLN
2.0000 g | Freq: Three times a day (TID) | INTRAVENOUS | Status: AC
  Administered 2023-05-06 – 2023-05-07 (×2): 2 g via INTRAVENOUS
  Filled 2023-05-06 (×2): qty 100

## 2023-05-06 MED ORDER — CHLORHEXIDINE GLUCONATE 0.12 % MT SOLN
15.0000 mL | Freq: Once | OROMUCOSAL | Status: AC
  Administered 2023-05-06: 15 mL via OROMUCOSAL
  Filled 2023-05-06: qty 15

## 2023-05-06 MED ORDER — BUPIVACAINE HCL (PF) 0.25 % IJ SOLN
INTRAMUSCULAR | Status: AC
  Filled 2023-05-06: qty 30

## 2023-05-06 MED ORDER — OXYCODONE HCL 5 MG PO TABS: 5.0000 mg | ORAL_TABLET | Freq: Once | ORAL | Status: DC | PRN

## 2023-05-06 MED ORDER — MIDAZOLAM HCL 2 MG/2ML IJ SOLN
INTRAMUSCULAR | Status: AC
  Filled 2023-05-06: qty 2

## 2023-05-06 MED ORDER — PHENOL 1.4 % MT LIQD: 1.0000 | OROMUCOSAL | Status: DC | PRN

## 2023-05-06 MED ORDER — SODIUM CHLORIDE 0.9% FLUSH: 3.0000 mL | INTRAVENOUS | Status: DC | PRN

## 2023-05-06 MED ORDER — SENNA 8.6 MG PO TABS
1.0000 | ORAL_TABLET | Freq: Two times a day (BID) | ORAL | Status: DC
  Administered 2023-05-06 – 2023-05-07 (×2): 8.6 mg via ORAL
  Filled 2023-05-06 (×2): qty 1

## 2023-05-06 MED ORDER — THROMBIN 5000 UNITS EX SOLR
CUTANEOUS | Status: AC
  Filled 2023-05-06: qty 5000

## 2023-05-06 MED ORDER — ONDANSETRON HCL 4 MG/2ML IJ SOLN
INTRAMUSCULAR | Status: DC | PRN
  Administered 2023-05-06: 4 mg via INTRAVENOUS

## 2023-05-06 MED ORDER — METHOCARBAMOL 500 MG PO TABS
500.0000 mg | ORAL_TABLET | Freq: Four times a day (QID) | ORAL | Status: DC | PRN
  Administered 2023-05-06 – 2023-05-07 (×3): 500 mg via ORAL
  Filled 2023-05-06 (×3): qty 1

## 2023-05-06 MED ORDER — ALBUMIN HUMAN 5 % IV SOLN: INTRAVENOUS | Status: DC | PRN

## 2023-05-06 MED ORDER — CEFAZOLIN SODIUM-DEXTROSE 2-3 GM-%(50ML) IV SOLR: INTRAVENOUS | Status: DC | PRN

## 2023-05-06 MED ORDER — SURGIRINSE WOUND IRRIGATION SYSTEM - OPTIME: TOPICAL | Status: DC | PRN

## 2023-05-06 MED ORDER — DEXAMETHASONE SODIUM PHOSPHATE 4 MG/ML IJ SOLN: 4.0000 mg | Freq: Four times a day (QID) | INTRAMUSCULAR | Status: DC

## 2023-05-06 MED ORDER — PHENYLEPHRINE 80 MCG/ML (10ML) SYRINGE FOR IV PUSH (FOR BLOOD PRESSURE SUPPORT)
PREFILLED_SYRINGE | INTRAVENOUS | Status: DC | PRN
  Administered 2023-05-06: 80 ug via INTRAVENOUS

## 2023-05-06 MED ORDER — DEXAMETHASONE SODIUM PHOSPHATE 10 MG/ML IJ SOLN
INTRAMUSCULAR | Status: DC | PRN
  Administered 2023-05-06: 10 mg via INTRAVENOUS

## 2023-05-06 MED ORDER — ORAL CARE MOUTH RINSE: 15.0000 mL | Freq: Once | OROMUCOSAL | Status: AC

## 2023-05-06 MED ORDER — SUGAMMADEX SODIUM 200 MG/2ML IV SOLN
INTRAVENOUS | Status: DC | PRN
  Administered 2023-05-06: 200 mg via INTRAVENOUS

## 2023-05-06 MED ORDER — SODIUM CHLORIDE 0.9 % IV SOLN
250.0000 mL | INTRAVENOUS | Status: DC
  Administered 2023-05-06: 250 mL via INTRAVENOUS

## 2023-05-06 MED ORDER — EPHEDRINE SULFATE-NACL 50-0.9 MG/10ML-% IV SOSY
PREFILLED_SYRINGE | INTRAVENOUS | Status: DC | PRN
  Administered 2023-05-06: 10 mg via INTRAVENOUS
  Administered 2023-05-06: 5 mg via INTRAVENOUS
  Administered 2023-05-06: 10 mg via INTRAVENOUS

## 2023-05-06 MED ORDER — THROMBIN 5000 UNITS EX SOLR: OROMUCOSAL | Status: DC | PRN

## 2023-05-06 MED ORDER — LACTATED RINGERS IV SOLN: INTRAVENOUS | Status: DC

## 2023-05-06 MED ORDER — GABAPENTIN 300 MG PO CAPS
300.0000 mg | ORAL_CAPSULE | ORAL | Status: AC
  Administered 2023-05-06: 300 mg via ORAL
  Filled 2023-05-06: qty 1

## 2023-05-06 MED ORDER — 0.9 % SODIUM CHLORIDE (POUR BTL) OPTIME
TOPICAL | Status: DC | PRN
  Administered 2023-05-06: 1000 mL

## 2023-05-06 MED ORDER — HYDROMORPHONE HCL 1 MG/ML IJ SOLN
INTRAMUSCULAR | Status: DC | PRN
  Administered 2023-05-06: .5 mg via INTRAVENOUS

## 2023-05-06 MED ORDER — LIDOCAINE 2% (20 MG/ML) 5 ML SYRINGE
INTRAMUSCULAR | Status: DC | PRN
  Administered 2023-05-06: 60 mg via INTRAVENOUS

## 2023-05-06 MED ORDER — FINASTERIDE 5 MG PO TABS
5.0000 mg | ORAL_TABLET | Freq: Every day | ORAL | Status: DC
  Administered 2023-05-06 – 2023-05-07 (×2): 5 mg via ORAL
  Filled 2023-05-06 (×2): qty 1

## 2023-05-06 MED ORDER — MENTHOL 3 MG MT LOZG: 1.0000 | LOZENGE | OROMUCOSAL | Status: DC | PRN

## 2023-05-06 MED ORDER — LOSARTAN POTASSIUM 50 MG PO TABS
100.0000 mg | ORAL_TABLET | Freq: Every day | ORAL | Status: DC
  Administered 2023-05-07: 100 mg via ORAL
  Filled 2023-05-06: qty 2

## 2023-05-06 MED ORDER — THROMBIN 20000 UNITS EX SOLR
CUTANEOUS | Status: AC
  Filled 2023-05-06: qty 20000

## 2023-05-06 MED ORDER — SODIUM CHLORIDE 0.9% FLUSH
3.0000 mL | Freq: Two times a day (BID) | INTRAVENOUS | Status: DC
  Administered 2023-05-06: 3 mL via INTRAVENOUS

## 2023-05-06 MED ORDER — ASPIRIN 81 MG PO CHEW
81.0000 mg | CHEWABLE_TABLET | Freq: Every day | ORAL | Status: DC
  Administered 2023-05-07: 81 mg via ORAL
  Filled 2023-05-06: qty 1

## 2023-05-06 MED ORDER — ONDANSETRON HCL 4 MG/2ML IJ SOLN
INTRAMUSCULAR | Status: AC
  Filled 2023-05-06: qty 2

## 2023-05-06 MED ORDER — LOSARTAN POTASSIUM-HCTZ 100-12.5 MG PO TABS: 1.0000 | ORAL_TABLET | Freq: Every day | ORAL | Status: DC

## 2023-05-06 MED ORDER — DEXAMETHASONE SODIUM PHOSPHATE 10 MG/ML IJ SOLN
INTRAMUSCULAR | Status: AC
  Filled 2023-05-06: qty 1

## 2023-05-06 MED ORDER — CEFAZOLIN SODIUM-DEXTROSE 2-4 GM/100ML-% IV SOLN
2.0000 g | INTRAVENOUS | Status: AC
  Administered 2023-05-06: 2 g via INTRAVENOUS
  Filled 2023-05-06: qty 100

## 2023-05-06 MED ORDER — ROCURONIUM BROMIDE 10 MG/ML (PF) SYRINGE
PREFILLED_SYRINGE | INTRAVENOUS | Status: AC
  Filled 2023-05-06: qty 10

## 2023-05-06 MED ORDER — ONDANSETRON HCL 4 MG/2ML IJ SOLN: 4.0000 mg | Freq: Four times a day (QID) | INTRAMUSCULAR | Status: DC | PRN

## 2023-05-06 MED ORDER — POTASSIUM CHLORIDE IN NACL 20-0.9 MEQ/L-% IV SOLN: INTRAVENOUS | Status: DC

## 2023-05-06 MED ORDER — HYDROMORPHONE HCL 1 MG/ML IJ SOLN
INTRAMUSCULAR | Status: AC
  Filled 2023-05-06: qty 0.5

## 2023-05-06 MED ORDER — ACETAMINOPHEN 160 MG/5ML PO SOLN: 1000.0000 mg | Freq: Once | ORAL | Status: DC | PRN

## 2023-05-06 MED ORDER — FENTANYL CITRATE (PF) 250 MCG/5ML IJ SOLN
INTRAMUSCULAR | Status: DC | PRN
  Administered 2023-05-06: 50 ug via INTRAVENOUS
  Administered 2023-05-06 (×2): 100 ug via INTRAVENOUS

## 2023-05-06 SURGICAL SUPPLY — 67 items
ADH SKN CLS APL DERMABOND .7 (GAUZE/BANDAGES/DRESSINGS) ×1
APL SKNCLS STERI-STRIP NONHPOA (GAUZE/BANDAGES/DRESSINGS) ×1
BAG COUNTER SPONGE SURGICOUNT (BAG) ×2 IMPLANT
BAG SPNG CNTER NS LX DISP (BAG) ×2
BASKET BONE COLLECTION (BASKET) ×1 IMPLANT
BENZOIN TINCTURE PRP APPL 2/3 (GAUZE/BANDAGES/DRESSINGS) ×2 IMPLANT
BLADE BONE MILL MEDIUM (MISCELLANEOUS) ×1 IMPLANT
BLADE CLIPPER SURG (BLADE) IMPLANT
BUR CARBIDE MATCH 3.0 (BURR) ×2 IMPLANT
BUR NEURO DRILL SOFT 3.0X3.8M (BURR) IMPLANT
CANISTER SUCT 3000ML PPV (MISCELLANEOUS) ×2 IMPLANT
CNTNR URN SCR LID CUP LEK RST (MISCELLANEOUS) ×2 IMPLANT
CONT SPEC 4OZ STRL OR WHT (MISCELLANEOUS) ×2
COVER BACK TABLE 60X90IN (DRAPES) ×2 IMPLANT
DERMABOND ADVANCED .7 DNX12 (GAUZE/BANDAGES/DRESSINGS) ×1 IMPLANT
DRAPE C-ARM 42X72 X-RAY (DRAPES) ×2 IMPLANT
DRAPE C-ARMOR (DRAPES) ×1 IMPLANT
DRAPE LAPAROTOMY 100X72X124 (DRAPES) ×2 IMPLANT
DRAPE SURG 17X23 STRL (DRAPES) ×2 IMPLANT
DRSG OPSITE POSTOP 4X6 (GAUZE/BANDAGES/DRESSINGS) IMPLANT
DURAPREP 26ML APPLICATOR (WOUND CARE) ×2 IMPLANT
ELECT REM PT RETURN 9FT ADLT (ELECTROSURGICAL) ×2
ELECTRODE REM PT RTRN 9FT ADLT (ELECTROSURGICAL) ×2 IMPLANT
EVACUATOR 1/8 PVC DRAIN (DRAIN) ×1 IMPLANT
FIBER BONE ALLOSYNC EXPAND 5 (Bone Implant) IMPLANT
GAUZE 4X4 16PLY ~~LOC~~+RFID DBL (SPONGE) IMPLANT
GLOVE BIO SURGEON STRL SZ7 (GLOVE) IMPLANT
GLOVE BIO SURGEON STRL SZ8 (GLOVE) ×4 IMPLANT
GLOVE BIOGEL PI IND STRL 7.0 (GLOVE) IMPLANT
GOWN STRL REUS W/ TWL LRG LVL3 (GOWN DISPOSABLE) IMPLANT
GOWN STRL REUS W/ TWL XL LVL3 (GOWN DISPOSABLE) ×4 IMPLANT
GOWN STRL REUS W/TWL 2XL LVL3 (GOWN DISPOSABLE) IMPLANT
GOWN STRL REUS W/TWL LRG LVL3 (GOWN DISPOSABLE)
GOWN STRL REUS W/TWL XL LVL3 (GOWN DISPOSABLE) ×4
HEMOSTAT POWDER KIT SURGIFOAM (HEMOSTASIS) ×1 IMPLANT
KIT BASIN OR (CUSTOM PROCEDURE TRAY) ×2 IMPLANT
KIT GRAFTMAG DEL NEURO DISP (NEUROSURGERY SUPPLIES) IMPLANT
KIT POSITION SURG JACKSON T1 (MISCELLANEOUS) ×1 IMPLANT
KIT TURNOVER KIT B (KITS) ×2 IMPLANT
MILL BONE PREP (MISCELLANEOUS) ×1 IMPLANT
NDL HYPO 25X1 1.5 SAFETY (NEEDLE) ×2 IMPLANT
NEEDLE HYPO 25X1 1.5 SAFETY (NEEDLE) ×2 IMPLANT
NS IRRIG 1000ML POUR BTL (IV SOLUTION) ×2 IMPLANT
PACK LAMINECTOMY NEURO (CUSTOM PROCEDURE TRAY) ×2 IMPLANT
PAD ARMBOARD 7.5X6 YLW CONV (MISCELLANEOUS) ×6 IMPLANT
PUTTY DBM 10CC (Putty) IMPLANT
ROD LORD LIPPED TI 5.5X60 (Rod) IMPLANT
SCREW CORT PA 6.5X50 (Screw) IMPLANT
SCREW KODIAK 6.5X45 (Screw) IMPLANT
SET SCREW (Screw) ×6 IMPLANT
SET SCREW SPNE (Screw) IMPLANT
SOL ELECTROSURG ANTI STICK (MISCELLANEOUS) ×1
SOLUTION ELECTROSURG ANTI STCK (MISCELLANEOUS) ×2 IMPLANT
SOLUTION IRRIG SURGIPHOR (IV SOLUTION) ×1 IMPLANT
SPACER IDENTITI 7X9X25 5D (Spacer) ×2 IMPLANT
SPACER IDENTITI 9X7X25 5D (Spacer) IMPLANT
SPONGE SURGIFOAM ABS GEL 100 (HEMOSTASIS) ×2 IMPLANT
SPONGE T-LAP 4X18 ~~LOC~~+RFID (SPONGE) IMPLANT
STRIP CLOSURE SKIN 1/2X4 (GAUZE/BANDAGES/DRESSINGS) ×4 IMPLANT
SUT VIC AB 0 CT1 18XCR BRD8 (SUTURE) ×2 IMPLANT
SUT VIC AB 0 CT1 8-18 (SUTURE) ×2
SUT VIC AB 2-0 CP2 18 (SUTURE) ×2 IMPLANT
SUT VIC AB 3-0 SH 8-18 (SUTURE) ×4 IMPLANT
TOWEL GREEN STERILE (TOWEL DISPOSABLE) ×2 IMPLANT
TOWEL GREEN STERILE FF (TOWEL DISPOSABLE) ×2 IMPLANT
TRAY FOLEY MTR SLVR 16FR STAT (SET/KITS/TRAYS/PACK) ×1 IMPLANT
WATER STERILE IRR 1000ML POUR (IV SOLUTION) ×2 IMPLANT

## 2023-05-06 NOTE — Op Note (Signed)
05/06/2023  1:59 PM  PATIENT:  Todd Kane  67 y.o. 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 Decompressive lumbar laminectomy, hemi facetectomy and foraminotomies L3-4 and L4-5 requiring more work than would be required for a simple exposure of the disk for PLIF in order to adequately decompress the neural elements and address the spinal stenosis, note that the L3-4 level was not a interbody fusion level and had a lytic spondylolisthesis so a Gill type decompression was done here  2. Posterior lumbar interbody fusion L4-5 using PTI interbody cages packed with morcellized allograft and autograft   3. Posterior fixation L3-L5 inclusive using ATEC cortical pedicle screws.   4. Intertransverse arthrodesis L3-L5 using morcellized autograft and allograft.  SURGEON:  Marikay Alar, MD  ASSISTANTS: Verlin Dike, FNP  ANESTHESIA:  General  EBL: 250 ml  Total I/O In: 1850 [I.V.:1500; IV Piggyback:350] Out: 1195 [Urine:695; Blood:500]  BLOOD ADMINISTERED:none  DRAINS: none   INDICATION FOR PROCEDURE: This patient presented with back pain with radiculopathy and a partial foot drop and weakness of plantarflexion. Imaging revealed bundle listhesis L3-4 L4-5 with spinal stenosis.  Nerve conduction studies showed radiculopathy of L5 and S1. The patient tried a reasonable attempt at conservative medical measures without relief. I recommended decompression and instrumented fusion to address the stenosis as well as the segmental  instability.  Patient understood the risks, benefits, and alternatives and potential outcomes and wished to proceed.  PROCEDURE DETAILS:  The patient was brought to the operating room. After induction of generalized endotracheal anesthesia the patient was rolled into the prone  position on chest rolls and all pressure points were padded. The patient's lumbar region was cleaned and then prepped with DuraPrep and draped in the usual sterile fashion. Anesthesia was injected and then a dorsal midline incision was made and carried down to the lumbosacral fascia. The fascia was opened and the paraspinous musculature was taken down in a subperiosteal fashion to expose L3 4 and L4-5. A self-retaining retractor was placed. Intraoperative fluoroscopy confirmed my level.   I then turned my attention to the decompression and complete lumbar laminectomies, hemi- facetectomies, and foraminotomies were performed at L3-4 and L4-5.  He had a lytic spondylolisthesis at L3-4 and therefore Gill type decompression was performed at this level.  My nurse practitioner was directly involved in the decompression and exposure of the neural elements. the patient had significant spinal stenosis and this required more work than would be required for a simple exposure of the disc for posterior lumbar interbody fusion which would only require a limited laminotomy. Much more generous decompression and generous foraminotomy was undertaken in order to adequately decompress the neural elements and address the patient's leg pain. The yellow ligament was removed to expose the underlying dura and nerve roots, and generous foraminotomies were performed to adequately decompress the neural elements. Both the exiting and traversing nerve roots were decompressed on both sides at both levels until a coronary dilator passed easily along the nerve roots. Once the decompression was complete, I turned my attention to the posterior lower lumbar interbody fusion at L4-5.  It was a posterior lip across the disc base at L3-4 with a completely collapsed disc base therefore we felt that a interbody fusion at this level offered more risk than benefit . the epidural venous vasculature was coagulated and cut sharply. Disc space was incised  at  L4-5 and the initial discectomy was performed with pituitary rongeurs. The disc space was distracted with sequential distractors to a height of 8 mm. We then used a series of scrapers and shavers to prepare the endplates for fusion. The midline was prepared with Epstein curettes. Once the complete discectomy was finished, we packed an appropriate sized interbody cage with local autograft and morcellized allograft, gently retracted the nerve root, and tapped the cage into position at L4-5.  The midline between the cages was packed with morselized autograft and allograft.   We then turned our attention to the placement of the pedicle screws. The pedicle screw entry zones were identified utilizing surface landmarks and fluoroscopy. I drilled into each pedicle utilizing the hand drill, and tapped each pedicle with the appropriate tap. We palpated with a ball probe to assure no break in the cortex. We then placed 6.5 x 50 mm pedicle screws at L3 and 6.5 x 45 mm screws into the pedicles bilaterally at L4 and L5 bilaterally.  My nurse practitioner assisted in placement of the pedicle screws.  We then decorticated the transverse processes and laid a mixture of morcellized autograft and allograft out over these to perform intertransverse arthrodesis at L3-L5 bilaterally. We then placed lordotic rods into the multiaxial screw heads of the pedicle screws and locked these in position with the locking caps and anti-torque device. We then checked our construct with AP and lateral fluoroscopy. Irrigated with copious amounts of 0.5% povidone iodine solution followed by saline solution. Inspected the nerve roots once again to assure adequate decompression, lined to the dura with Gelfoam, placed a medium Hemovac drain through a separate stab incision and then we closed the muscle and the fascia with 0 Vicryl. Closed the subcutaneous tissues with 2-0 Vicryl and subcuticular tissues with 3-0 Vicryl. The skin was closed with benzoin  and Steri-Strips. Dressing was then applied, the patient was awakened from general anesthesia and transported to the recovery room in stable condition. At the end of the procedure all sponge, needle and instrument counts were correct.   PLAN OF CARE: admit to inpatient  PATIENT DISPOSITION:  PACU - hemodynamically stable.   Delay start of Pharmacological VTE agent (>24hrs) due to surgical blood loss or risk of bleeding:  yes

## 2023-05-06 NOTE — Anesthesia Procedure Notes (Addendum)
Procedure Name: Intubation Date/Time: 05/06/2023 10:02 AM  Performed by: Carolynne Edouard, RNPre-anesthesia Checklist: Patient identified, Emergency Drugs available, Suction available and Patient being monitored Patient Re-evaluated:Patient Re-evaluated prior to induction Oxygen Delivery Method: Circle system utilized Preoxygenation: Pre-oxygenation with 100% oxygen Induction Type: IV induction Ventilation: Mask ventilation without difficulty Laryngoscope Size: Mac and 4 Grade View: Grade I Tube type: Oral Tube size: 7.5 mm Number of attempts: 1 Airway Equipment and Method: Stylet and Oral airway Placement Confirmation: ETT inserted through vocal cords under direct vision, positive ETCO2 and breath sounds checked- equal and bilateral Secured at: 23 cm Tube secured with: Tape Dental Injury: Teeth and Oropharynx as per pre-operative assessment  Comments: Airway per A Manglesdorf, SRNA

## 2023-05-06 NOTE — Transfer of Care (Signed)
Immediate Anesthesia Transfer of Care Note  Patient: Todd Kane  Procedure(s) Performed: POSTERIOR LUMBAR INTERBODY FUSION, LUMBAR FOUR-LUMBAR FIVE (Back) POSTEROLATERAL FUSION LUMBAR THREE-LUMBAR FOUR (Back)  Patient Location: PACU  Anesthesia Type:General  Level of Consciousness: drowsy and patient cooperative  Airway & Oxygen Therapy: Patient Spontanous Breathing  Post-op Assessment: Report given to RN and Post -op Vital signs reviewed and stable  Post vital signs: Reviewed and stable  Last Vitals:  Vitals Value Taken Time  BP 129/71 05/06/23 1409  Temp    Pulse 62 05/06/23 1411  Resp 15 05/06/23 1411  SpO2 97 % 05/06/23 1411  Vitals shown include unvalidated device data.  Last Pain:  Vitals:   05/06/23 0711  PainSc: 0-No pain         Complications: No notable events documented.

## 2023-05-06 NOTE — H&P (Signed)
Subjective: Patient is a 67 y.o. male admitted for back and leg pain. Onset of symptoms was several years ago, gradually worsening since that time.  The pain is rated moderate, and is located at the across the lower back and radiates to legs with leg weakness in L5 and S1 distribution. The pain is described as aching and occurs intermittently. The symptoms have been progressive. Symptoms are exacerbated by exercise, standing, and walking for more than a few minutes. MRI or CT showed spondylolisthesis with stenosis, EMGs L L5 and s1 radics   Past Medical History:  Diagnosis Date   Back pain    BPH (benign prostatic hyperplasia)    CAD (coronary artery disease)    a. mild-mod by cardiac CT 03/2017.   Chest pain    Hyperlipidemia    Hypertension    OSA (obstructive sleep apnea) 2013   Pre-diabetes    Spondylolisthesis     Past Surgical History:  Procedure Laterality Date   MOUTH SURGERY  1982   3rd molars   TONSILLECTOMY AND ADENOIDECTOMY  1959    Prior to Admission medications   Medication Sig Start Date End Date Taking? Authorizing Provider  alfuzosin (UROXATRAL) 10 MG 24 hr tablet Take 10 mg by mouth daily with breakfast.   Yes [provider]  aspirin 81 MG tablet Take 81 mg by mouth daily.   Yes [provider]  atorvastatin (LIPITOR) 40 MG tablet Take 40 mg by mouth daily.   Yes [provider]  B Complex-C (SUPER B-COMPLEX + VITAMIN C) TABS Take 1 tablet by mouth daily.   Yes [provider]  cholecalciferol (VITAMIN D) 1000 UNITS tablet Take 2,000 Units by mouth daily.    Yes [provider]  Coenzyme Q10 (COQ-10 PO) Take 100 mg by mouth daily.   Yes [provider]  ezetimibe (ZETIA) 10 MG tablet Take 1 tablet (10 mg total) by mouth daily. Please keep upcoming appt in April 2023 with Cardiologist before anymore refills. Thank you Final Attempt 12/25/21  Yes Dyann Kief, PA-C  finasteride (PROSCAR) 5 MG tablet Take 5 mg by  mouth daily. 11/29/14  Yes [provider]  losartan-hydrochlorothiazide (HYZAAR) 100-12.5 MG tablet Take 1 tablet by mouth daily. 02/22/21  Yes [provider]  sildenafil (VIAGRA) 50 MG tablet as needed.  01/24/20  Yes [provider]  vitamin B-12 (CYANOCOBALAMIN) 1000 MCG tablet Take 1,000 mcg by mouth daily.   Yes [provider]  vitamin E 180 MG (400 UNITS) capsule Take 400 mg by mouth daily.   Yes [provider]   Allergies  Allergen Reactions   Oxytetracycline     Not known   Sulfa Antibiotics     Not known    Social History   Tobacco Use   Smoking status: Never   Smokeless tobacco: Never  Substance Use Topics   Alcohol use: Yes    Alcohol/week: 2.0 standard drinks of alcohol    Types: 1 Glasses of wine, 1 Cans of beer per week    Comment: on social occasions    Family History  Problem Relation Age of Onset   Heart disease Mother    COPD Mother    Breast cancer Mother    Coronary artery disease Mother    Heart Problems Mother    COPD Father    Other Brother        intercranial hemmorage     Review of Systems  Positive ROS: neg  All  other systems have been reviewed and were otherwise negative with the exception of those mentioned in the HPI and as above.  Objective: Vital signs in last 24 hours: Temp:  [98.3 F (36.8 C)] 98.3 F (36.8 C) (07/01 0655) Pulse Rate:  [58] 58 (07/01 0655) Resp:  [18] 18 (07/01 0655) BP: (171)/(98) 171/98 (07/01 0655) SpO2:  [95 %] 95 % (07/01 0655) Weight:  [95.7 kg] 95.7 kg (07/01 0655)  General Appearance: Alert, cooperative, no distress, appears stated age Head: Normocephalic, without obvious abnormality, atraumatic Eyes: PERRL, conjunctiva/corneas clear, EOM's intact    Neck: Supple, symmetrical, trachea midline Back: Symmetric, no curvature, ROM normal, no CVA tenderness Lungs:  respirations unlabored Heart: Regular rate and rhythm Abdomen: Soft, non-tender Extremities:  Extremities normal, atraumatic, no cyanosis or edema Pulses: 2+ and symmetric all extremities Skin: Skin color, texture, turgor normal, no rashes or lesions  NEUROLOGIC:   Mental status: Alert and oriented x4,  no aphasia, good attention span, fund of knowledge, and memory Motor Exam - grossly normal except L df and PF 4/5 Sensory Exam - grossly normal Reflexes: 1+ Coordination - grossly normal Gait - grossly normal Balance - grossly normal Cranial Nerves: I: smell Not tested  II: visual acuity  OS: nl    OD: nl  II: visual fields Full to confrontation  II: pupils Equal, round, reactive to light  III,VII: ptosis None  III,IV,VI: extraocular muscles  Full ROM  V: mastication Normal  V: facial light touch sensation  Normal  V,VII: corneal reflex  Present  VII: facial muscle function - upper  Normal  VII: facial muscle function - lower Normal  VIII: hearing Not tested  IX: soft palate elevation  Normal  IX,X: gag reflex Present  XI: trapezius strength  5/5  XI: sternocleidomastoid strength 5/5  XI: neck flexion strength  5/5  XII: tongue strength  Normal    Data Review Lab Results  Component Value Date   WBC 4.4 04/26/2023   HGB 14.5 04/26/2023   HCT 44.1 04/26/2023   MCV 92.6 04/26/2023   PLT 193 04/26/2023   Lab Results  Component Value Date   NA 140 04/26/2023   K 3.7 04/26/2023   CL 104 04/26/2023   CO2 29 04/26/2023   BUN 27 (H) 04/26/2023   CREATININE 1.22 04/26/2023   GLUCOSE 79 04/26/2023   Lab Results  Component Value Date   INR 1.0 04/26/2023    Assessment/Plan:  Estimated body mass index is 30.28 kg/m as calculated from the following:   Height as of this encounter: 5\' 10"  (1.778 m).   Weight as of this encounter: 95.7 kg. Patient admitted for PLIF L3-4 L4-5. Patient has failed a reasonable attempt at conservative therapy.  I explained the condition and procedure to the patient and answered any questions.  Patient wishes to proceed with  procedure as planned. Understands risks/ benefits and typical outcomes of procedure.   [name] 05/06/2023 9:16 AM

## 2023-05-07 ENCOUNTER — Ambulatory Visit: Payer: Medicare PPO | Admitting: Cardiology

## 2023-05-07 ENCOUNTER — Encounter (HOSPITAL_COMMUNITY): Payer: Self-pay | Admitting: Neurological Surgery

## 2023-05-07 DIAGNOSIS — M48061 Spinal stenosis, lumbar region without neurogenic claudication: Secondary | ICD-10-CM | POA: Diagnosis not present

## 2023-05-07 DIAGNOSIS — Z01810 Encounter for preprocedural cardiovascular examination: Secondary | ICD-10-CM | POA: Diagnosis not present

## 2023-05-07 DIAGNOSIS — I2510 Atherosclerotic heart disease of native coronary artery without angina pectoris: Secondary | ICD-10-CM | POA: Diagnosis not present

## 2023-05-07 DIAGNOSIS — M4316 Spondylolisthesis, lumbar region: Secondary | ICD-10-CM | POA: Diagnosis not present

## 2023-05-07 DIAGNOSIS — Z7982 Long term (current) use of aspirin: Secondary | ICD-10-CM | POA: Diagnosis not present

## 2023-05-07 DIAGNOSIS — Z79899 Other long term (current) drug therapy: Secondary | ICD-10-CM | POA: Diagnosis not present

## 2023-05-07 DIAGNOSIS — R7303 Prediabetes: Secondary | ICD-10-CM | POA: Diagnosis not present

## 2023-05-07 DIAGNOSIS — I10 Essential (primary) hypertension: Secondary | ICD-10-CM | POA: Diagnosis not present

## 2023-05-07 DIAGNOSIS — M5416 Radiculopathy, lumbar region: Secondary | ICD-10-CM | POA: Diagnosis not present

## 2023-05-07 MED ORDER — OXYCODONE-ACETAMINOPHEN 5-325 MG PO TABS: 1.0000 | ORAL_TABLET | ORAL | 0 refills | Status: AC | PRN

## 2023-05-07 MED ORDER — METHOCARBAMOL 500 MG PO TABS: 500.0000 mg | ORAL_TABLET | Freq: Four times a day (QID) | ORAL | 0 refills | Status: AC | PRN

## 2023-05-07 NOTE — Progress Notes (Signed)
Patient awaiting family for discharge home; in no acute distress nor complaints of pain nor discomfort; incision on back with honeycomb dressing  clean, dry and intact; room was checked and accounted for all his belongings; discharge instructions concerning his medications, incision care, follow up appointment and when to call the doctor as needed were all discussed with patient by RN and patient and family expressed understanding on the instructions given.

## 2023-05-07 NOTE — Discharge Summary (Signed)
Physician Discharge Summary  Patient ID: Todd Kane MRN: [id number] DOB/AGE: 1956/06/25 67 y.o.  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 The patient tolerated the procedure well and was taken to the recovery room and then to the floor in stable condition. The hospital course was routine. There were no complications. The wound remained clean dry and intact. Pt had appropriate back soreness. No complaints of leg pain or new N/T/W. The patient remained afebrile with stable vital signs, and tolerated a regular diet. The patient continued to increase activities, and pain was well controlled with oral pain medications.   Consults: None  Significant Diagnostic Studies:  Results for orders placed or performed during the hospital encounter of 05/06/23  ABO/Rh  Result Value Ref Range   ABO/RH(D)      B POS Performed at [facility], [address], [place], [place] [address]     DG Lumbar Spine 2-3 Views  Result Date: 05/06/2023 CLINICAL DATA:  Fluoroscopic assistance for lumbar spinal fusion EXAM: LUMBAR SPINE - 2-3 VIEW COMPARISON:  07/12/2022 FINDINGS: Fluoroscopic images show lumbar spinal fusion from L3-L5 levels. Intervertebral disc spacer is seen at L4-L5 level. There is anterolisthesis at L3-L4 and L4-L5 levels. Fluoroscopy time 1 minute and 22 seconds. Radiation dose 72.89 mGy. IMPRESSION: Fluoroscopic assistance was provided for surgical fusion from L3-L5 levels. Electronically Signed   By: Ernie Avena M.D.   On: 05/06/2023 18:18   DG C-Arm 1-60 Min-No Report  Result Date:  05/06/2023 Fluoroscopy was utilized by the requesting physician.  No radiographic interpretation.   DG C-Arm 1-60 Min-No Report  Result Date: 05/06/2023 Fluoroscopy was utilized by the requesting physician.  No radiographic interpretation.   DG C-Arm 1-60 Min-No Report  Result Date: 05/06/2023 Fluoroscopy was utilized by the requesting physician.  No radiographic interpretation.   DG C-Arm 1-60 Min-No Report  Result Date: 05/06/2023 Fluoroscopy was utilized by the requesting physician.  No radiographic interpretation.    Antibiotics:  Anti-infectives (From admission, onward)    Start     Dose/Rate Route Frequency Ordered Stop   05/06/23 1600  ceFAZolin (ANCEF) IVPB 2g/100 mL premix        2 g 200 mL/hr over 30 Minutes Intravenous Every 8 hours 05/06/23 1505 05/07/23 0045   05/06/23 0700  ceFAZolin (ANCEF) IVPB 2g/100 mL premix        2 g 200 mL/hr over 30 Minutes Intravenous On call to O.R. 05/06/23 0648 05/06/23 1007       Discharge Exam: Blood pressure (!) 143/84, pulse (!) 54, temperature 97.6 F (36.4 C), temperature source Oral, resp. rate 20, height 5\' 10"  (1.778 m), weight 95.7 kg, SpO2 97 %. Neurologic: Grossly normal Ambulating and voiding well incision cdi   Discharge Medications:   Allergies as of 05/07/2023       Reactions   Oxytetracycline    Not known   Sulfa Antibiotics    Not known        Medication List     TAKE these medications    alfuzosin 10 MG 24 hr tablet Commonly known as: UROXATRAL Take 10 mg by mouth daily with breakfast.   aspirin 81  MG tablet Take 81 mg by mouth daily.   atorvastatin 40 MG tablet Commonly known as: LIPITOR Take 40 mg by mouth daily.   cholecalciferol 1000 units tablet Commonly known as: VITAMIN D Take 2,000 Units by mouth daily.   COQ-10 PO Take 100 mg by mouth daily.   cyanocobalamin 1000 MCG tablet Commonly known as: VITAMIN B12 Take 1,000 mcg by mouth daily.   ezetimibe 10 MG tablet Commonly known as:  ZETIA Take 1 tablet (10 mg total) by mouth daily. Please keep upcoming appt in April 2023 with Cardiologist before anymore refills. Thank you Final Attempt   finasteride 5 MG tablet Commonly known as: PROSCAR Take 5 mg by mouth daily.   losartan-hydrochlorothiazide 100-12.5 MG tablet Commonly known as: HYZAAR Take 1 tablet by mouth daily.   methocarbamol 500 MG tablet Commonly known as: ROBAXIN Take 1 tablet (500 mg total) by mouth every 6 (six) hours as needed for muscle spasms.   oxyCODONE-acetaminophen 5-325 MG tablet Commonly known as: Percocet Take 1 tablet by mouth every 4 (four) hours as needed for severe pain.   sildenafil 50 MG tablet Commonly known as: VIAGRA as needed.   Super B-Complex + Vitamin C Tabs Take 1 tablet by mouth daily.   vitamin E 180 MG (400 UNITS) capsule Take 400 mg by mouth daily.               Durable Medical Equipment  (From admission, onward)           Start     Ordered   05/06/23 1526  DME Walker rolling  Once       Question:  Patient needs a walker to treat with the following condition  Answer:  S/P lumbar fusion   05/06/23 1525   05/06/23 1526  DME 3 n 1  Once        05/06/23 1525            Disposition: home   Final Dx: PLIF L4-5, posterior lateral decompression and fusion L3-4  Discharge Instructions      Remove dressing in 72 hours   Complete by: As directed    Call MD for:  difficulty breathing, headache or visual disturbances   Complete by: As directed    Call MD for:  extreme fatigue   Complete by: As directed    Call MD for:  hives   Complete by: As directed    Call MD for:  persistant dizziness or light-headedness   Complete by: As directed    Call MD for:  persistant nausea and vomiting   Complete by: As directed    Call MD for:  redness, tenderness, or signs of infection (pain, swelling, redness, odor or green/yellow discharge around incision site)   Complete by: As directed    Call MD for:   severe uncontrolled pain   Complete by: As directed    Call MD for:  temperature >100.4   Complete by: As directed    Diet - low sodium heart healthy   Complete by: As directed    Driving Restrictions   Complete by: As directed    No driving for 2 weeks, no riding in the car for 1 week   Increase activity slowly   Complete by: As directed    Lifting restrictions   Complete by: As directed    No lifting more than 8 lbs          Signed: Tiana Loft Veronica Fretz 05/07/2023, 8:50 AM

## 2023-05-07 NOTE — Progress Notes (Signed)
OT Cancellation Note and Discharge  Patient Details Name: Todd Kane MRN: [id number] DOB: [birth year]   Cancelled Treatment:    Reason Eval/Treat Not Completed: OT screened, no needs identified, will sign off. Pt's daughter is an acute care OT and is fully aware of what and how her dad needs to do ADLs and activities in general while following back precautions.  Lindon Romp OT Acute Rehabilitation Services Office [phone]    Evette Georges 05/07/2023, 8:57 AM

## 2023-05-07 NOTE — Evaluation (Signed)
Physical Therapy Evaluation Patient Details Name: Todd Kane MRN: [id number] DOB: 1956/01/25 Today's Date: 05/07/2023  History of Present Illness  67 y.o. male admitted for back and leg pain.MRI or CT showed spondylolisthesis with stenosis, EMGs L L5 and s1 radics. S/p 7/1 Posterior lumbar interbody fusion L4-5 using PTI interbody cages, Posterior fixation L3-L5, Intertransverse arthrodesis L3-L5. PMH:CAD, HLD, HTN, OSA, Spondylolisthesis  Clinical Impression  Pt lives with wife in multistory home with bed and bath downstairs and 3 steps to enter. Pt independent prior to sx/ Pt is currently mobilizing at a light min A to supervision level. Able to ambulate with RW and ascend/descent 3 steps. Pt can recall 3/3 spinal precautions. Pt to discharge this morning. Pt will need RW for home use. Pt has no further PT needs at this time. PT signing off.         Assistance Recommended at Discharge Intermittent Supervision/Assistance  If plan is discharge home, recommend the following:  Can travel by private vehicle  Assistance with cooking/housework;Assist for transportation;A little help with bathing/dressing/bathroom;A little help with walking and/or transfers        Equipment Recommendations Rolling walker (2 wheels)     Functional Status Assessment Patient has had a recent decline in their functional status and demonstrates the ability to make significant improvements in function in a reasonable and predictable amount of time.     Precautions / Restrictions Precautions Precautions: Back Precaution Booklet Issued: Yes (comment) Precaution Comments: recalls 3/3 precautions at end of session Restrictions Weight Bearing Restrictions: No      Mobility  Bed Mobility Overal bed mobility: Needs Assistance Bed Mobility: Rolling, Supine to Sit Rolling: Min assist   Supine to sit: Min assist     General bed mobility comments: very light min A for cuing to log roll and min A for  bringing trunk to upright    Transfers Overall transfer level: Needs assistance Equipment used: Rolling walker (2 wheels) Transfers: Sit to/from Stand Sit to Stand: Min guard           General transfer comment: vc for pushing off from the bed to stand, good power up and self steadying    Ambulation/Gait Ambulation/Gait assistance: Min guard, Supervision Gait Distance (Feet): 350 Feet Assistive device: Rolling walker (2 wheels) Gait Pattern/deviations: WFL(Within Functional Limits), Step-through pattern Gait velocity: WFL Gait velocity interpretation: 1.31 - 2.62 ft/sec, indicative of limited community ambulator   General Gait Details: min guard progressing to supervision, vc for not twisting while talking  Stairs Stairs: Yes Stairs assistance: Min assist, Min guard Stair Management: One rail Right, Forwards, Alternating pattern Number of Stairs: 4 General stair comments: min A progressing to min guard, taught step to pattern for weaker L LE but able to complete easily with step over step pattern        Balance Overall balance assessment: Modified Independent (UE support for dynamic activities)                                           Pertinent Vitals/Pain Pain Assessment Pain Assessment: Faces Faces Pain Scale: Hurts a little bit Pain Location: back with movement Pain Descriptors / Indicators: Operative site guarding, Discomfort, Tightness Pain Intervention(s): Limited activity within patient's tolerance, Monitored during session, Premedicated before session, Repositioned    Home Living Family/patient expects to be discharged to:: Private residence Living Arrangements: Spouse/significant other Available Help at  Discharge: Available 24 hours/day Type of Home: House Home Access: Stairs to enter Entrance Stairs-Rails: Right Entrance Stairs-Number of Steps: 3   Home Layout: Multi-level;Able to live on main level with bedroom/bathroom Home  Equipment: None      Prior Function Prior Level of Function : Independent/Modified Independent (dentist in the community)                        Extremity/Trunk Assessment   Upper Extremity Assessment Upper Extremity Assessment: Overall WFL for tasks assessed    Lower Extremity Assessment Lower Extremity Assessment: Overall WFL for tasks assessed    Cervical / Trunk Assessment Cervical / Trunk Assessment: Back Surgery  Communication   Communication: No difficulties  Cognition Arousal/Alertness: Awake/alert Behavior During Therapy: WFL for tasks assessed/performed Overall Cognitive Status: Within Functional Limits for tasks assessed                                          General Comments General comments (skin integrity, edema, etc.): VSS on RA, daughter, who is an acute care OT, and wife are present        Assessment/Plan    PT Assessment Patient does not need any further PT services         PT Goals (Current goals can be found in the Care Plan section)  Acute Rehab PT Goals Patient Stated Goal: have less back pain PT Goal Formulation: With patient/family Time For Goal Achievement: 05/21/23 Potential to Achieve Goals: Good     AM-PAC PT "6 Clicks" Mobility  Outcome Measure Help needed turning from your back to your side while in a flat bed without using bedrails?: A Little Help needed moving from lying on your back to sitting on the side of a flat bed without using bedrails?: A Little Help needed moving to and from a bed to a chair (including a wheelchair)?: None Help needed standing up from a chair using your arms (e.g., wheelchair or bedside chair)?: None Help needed to walk in hospital room?: None Help needed climbing 3-5 steps with a railing? : A Little 6 Click Score: 21    End of Session Equipment Utilized During Treatment: Gait belt;Back brace Activity Tolerance: Patient tolerated treatment well Patient left: with call  bell/phone within reach;with family/visitor present;Other (comment) (sitting EoB waiting for OT) Nurse Communication: Mobility status PT Visit Diagnosis: Pain Pain - part of body:  (back)    Time: 0822-0850 PT Time Calculation (min) (ACUTE ONLY): 28 min   Charges:   PT Evaluation $PT Eval Moderate Complexity: 1 Mod PT Treatments $Gait Training: 8-22 mins PT General Charges $$ ACUTE PT VISIT: 1 Visit         Murrel Freet B. Beverely Risen PT, DPT Acute Rehabilitation Services Please use secure chat or  Call Office [phone]   [facility] 05/07/2023, 9:11 AM

## 2023-05-07 NOTE — Anesthesia Postprocedure Evaluation (Signed)
Anesthesia Post Note  Patient: Todd Kane  Procedure(s) Performed: POSTERIOR LUMBAR INTERBODY FUSION, LUMBAR FOUR-LUMBAR FIVE (Back) POSTEROLATERAL FUSION LUMBAR THREE-LUMBAR FOUR (Back)     Patient location during evaluation: PACU Anesthesia Type: General Level of consciousness: awake and alert Pain management: pain level controlled Vital Signs Assessment: post-procedure vital signs reviewed and stable Respiratory status: spontaneous breathing, nonlabored ventilation, respiratory function stable and patient connected to nasal cannula oxygen Cardiovascular status: blood pressure returned to baseline and stable Postop Assessment: no apparent nausea or vomiting Anesthetic complications: no   No notable events documented.  Last Vitals:  Vitals:   05/07/23 0518 05/07/23 0742  BP: 118/69 (!) 143/84  Pulse: (!) 58 (!) 54  Resp: 20 20  Temp: (!) 36.3 C 36.4 C  SpO2: 94% 97%    Last Pain:  Vitals:   05/07/23 1108  TempSrc:   PainSc: 3                  Brexley Cutshaw

## 2023-05-13 ENCOUNTER — Encounter (HOSPITAL_COMMUNITY): Payer: Self-pay | Admitting: Neurological Surgery

## 2023-06-18 DIAGNOSIS — M4316 Spondylolisthesis, lumbar region: Secondary | ICD-10-CM | POA: Diagnosis not present

## 2023-06-24 DIAGNOSIS — Z981 Arthrodesis status: Secondary | ICD-10-CM | POA: Diagnosis not present

## 2023-06-24 DIAGNOSIS — M4316 Spondylolisthesis, lumbar region: Secondary | ICD-10-CM | POA: Diagnosis not present

## 2023-07-11 ENCOUNTER — Ambulatory Visit: Payer: Medicare PPO | Admitting: Physical Therapy

## 2023-07-16 ENCOUNTER — Ambulatory Visit: Payer: Medicare PPO | Attending: Student | Admitting: Rehabilitative and Restorative Service Providers"

## 2023-07-16 ENCOUNTER — Other Ambulatory Visit: Payer: Self-pay

## 2023-07-16 ENCOUNTER — Encounter: Payer: Self-pay | Admitting: Rehabilitative and Restorative Service Providers"

## 2023-07-16 DIAGNOSIS — M5459 Other low back pain: Secondary | ICD-10-CM | POA: Diagnosis not present

## 2023-07-16 DIAGNOSIS — M6281 Muscle weakness (generalized): Secondary | ICD-10-CM | POA: Diagnosis not present

## 2023-07-16 DIAGNOSIS — R2689 Other abnormalities of gait and mobility: Secondary | ICD-10-CM | POA: Diagnosis not present

## 2023-07-16 DIAGNOSIS — R252 Cramp and spasm: Secondary | ICD-10-CM

## 2023-07-16 DIAGNOSIS — R262 Difficulty in walking, not elsewhere classified: Secondary | ICD-10-CM | POA: Diagnosis not present

## 2023-07-16 NOTE — Therapy (Signed)
OUTPATIENT PHYSICAL THERAPY THORACOLUMBAR EVALUATION   Patient Name: Todd Kane MRN: [id number] DOB:Feb 21, 1956, 67 y.o., male Today's Date: 07/16/2023  END OF SESSION:  PT End of Session - 07/16/23 1108     Visit Number 1    Date for PT Re-Evaluation 09/06/23    Authorization Type [employer]    Progress Note Due on Visit 10    PT Start Time 1100    PT Stop Time 1140    PT Time Calculation (min) 40 min    Activity Tolerance Patient tolerated treatment well    Behavior During Therapy WFL for tasks assessed/performed             Past Medical History:  Diagnosis Date   Back pain    BPH (benign prostatic hyperplasia)    CAD (coronary artery disease)    a. mild-mod by cardiac CT 03/2017.   Chest pain    Hyperlipidemia    Hypertension    OSA (obstructive sleep apnea) 2013   Pre-diabetes    Spondylolisthesis    Past Surgical History:  Procedure Laterality Date   LAMINECTOMY WITH POSTERIOR LATERAL ARTHRODESIS LEVEL 1 N/A 05/06/2023   Procedure: POSTEROLATERAL FUSION LUMBAR THREE-LUMBAR FOUR;  Surgeon: Arman Bogus, MD;  Location: [facility] OR;  Service: Neurosurgery;  Laterality: N/A;   MOUTH SURGERY  1982   3rd molars   TONSILLECTOMY AND ADENOIDECTOMY  1959   Patient Active Problem List   Diagnosis Date Noted   S/P lumbar fusion 05/06/2023   Preoperative cardiovascular examination 04/10/2023   Pre-diabetes 08/03/2020   Coronary artery calcification seen on CAT scan 01/14/2018   Family history of early CAD 01/14/2018   Chest pain 01/29/2017   Chest pain of uncertain etiology 12/23/2014   Dyspnea on exertion 12/23/2014   Sciatica of right side 12/23/2014   Essential hypertension 12/23/2014   Hypercholesterolemia 12/23/2014    PCP: Farris Has, MD  REFERRING PROVIDER: Sherryl Manges, NP  REFERRING DIAG: M43.16 (ICD-10-CM) - Spondylolisthesis, lumbar region  Rationale for Evaluation and Treatment: Rehabilitation  THERAPY DIAG:   Other low back pain  Muscle weakness (generalized)  Cramp and spasm  Difficulty in walking, not elsewhere classified  Other abnormalities of gait and mobility  ONSET DATE: S/p PLIF L4-5 and posterior lateral fusion L3-4 on 05/06/2023 by Dr Yetta Barre  SUBJECTIVE:                                                                                                                                                                                           SUBJECTIVE STATEMENT: Pt was having foot drop noted and after failed conservative treatment, he underwent  a surgical procedure by Dr Yetta Barre for his spondylolisthesis on 05/06/2023.  Pt reports that he has been feeling better, overall, since surgery.  However, pt wants to be able to return to the gym and strengthen to allow him to play golf, so he wanted to come to PT to help him safely return to those activities.  PERTINENT HISTORY:  S/p PLIF L4-5 and posterior lateral fusion L3-4 on 05/06/2023 by Dr Yetta Barre, Spondlyolisthesis, BPH, CAD  PAIN:  Are you having pain? Yes: NPRS scale: 0-2/10 Pain location: low back Pain description: sore Aggravating factors: prolonged periods in certain positions or prolonged ambulation Relieving factors: rest  PRECAUTIONS: Back  RED FLAGS: None   WEIGHT BEARING RESTRICTIONS: No  FALLS:  Has patient fallen in last 6 months? Yes. Number of falls 1 fall when he tripped on something approximately a month after surgery.  LIVING ENVIRONMENT: Lives with: lives with their spouse Lives in: House/apartment Stairs:  multilevel home Has following equipment at home: Dan Humphreys - 2 wheeled and Grab bars  OCCUPATION: Retired Cabin crew for 30 years   PLOF: Independent and Leisure: golfing  PATIENT GOALS: To return to the gym and to playing golf.  NEXT MD VISIT: "a couple of months" for follow up with Dr Yetta Barre  OBJECTIVE:   DIAGNOSTIC FINDINGS:  Lumbar Radiograph on 05/06/2023: IMPRESSION: Fluoroscopic assistance was provided  for surgical fusion from L3-L5 levels.  PATIENT SURVEYS:  Eval:  FOTO 52 (projected 63 by visit 11)   COGNITION: Overall cognitive status: Within functional limits for tasks assessed     SENSATION: Pt reports some numbness and tingling in his feet, primarily left LE.  States that it is improved from what it is from prior to surgery.  MUSCLE LENGTH: Hamstrings: tightness bilat  POSTURE: forward head  PALPATION: Mild tenderness to palpation along lumbar spine  LUMBAR ROM:   Eval: Limited at least 25% with increased pain.  Limited by previous precautions from back surgery.  LOWER EXTREMITY ROM:     Eval:  WFL  LOWER EXTREMITY MMT:    Eval:  RLE strength is WFL, left hip strength is grossly 4 to 4+/5 throughout  FUNCTIONAL TESTS:  Eval: 5 times sit to stand: 11.74 sec Timed up and go (TUG): 8.99 sec Single Leg Stance:  right- >30 sec with min to mod balance corrections/strategies.  Left- >30 sec with excessive balance strategies/corrections)  GAIT: Distance walked: >500 ft Assistive device utilized: None Level of assistance: Complete Independence Comments: Pt reports that he usually stops after 15-20 minutes before he stops and reports discomfort with ambulation.  TODAY'S TREATMENT:                                                                                                                              DATE: 07/16/2023 Reviewed HEP and discussed PT goals  Nustep level 3 x5 min with PT present to discuss status   PATIENT EDUCATION:  Education details: Issued HEP Person educated:  Patient Education method: Explanation, Demonstration, and Handouts Education comprehension: verbalized understanding and returned demonstration  HOME EXERCISE PROGRAM: Access Code: [email or web address] URL: [email or web address] Date: 07/16/2023 Prepared by: Clydie Braun Joshwa Hemric  Exercises - Seated Hamstring Stretch  - 1 x daily - 7 x weekly - 2 reps - 20 sec hold - Seated Piriformis  Stretch with Trunk Bend  - 1 x daily - 7 x weekly - 2 reps - 20 sec hold - Supine Bridge  - 1 x daily - 7 x weekly - 2 sets - 10 reps - Clamshell  - 1 x daily - 7 x weekly - 2 sets - 10 reps  ASSESSMENT:  CLINICAL IMPRESSION: Patient is a 67 y.o. male who was seen today for physical therapy evaluation and treatment for lumbar spondylolisthesis s/p PLIF L4-5 and posterior lateral fusion L3-4 on 05/06/2023 by Dr Yetta Barre. Patient is known to this PT clinic from previous conservative treatment prior to lumbar fusion secondary to foot drop.  Patient presents to PT with some left hip weakness, increased pain, decreased balance, and decreased core stability following lumbar fusion.  Patient presents to skilled PT to progress towards goal related activities and his ability to return to going to the gym and playing golf.  OBJECTIVE IMPAIRMENTS: decreased balance, difficulty walking, decreased strength, increased muscle spasms, impaired flexibility, postural dysfunction, and pain.   ACTIVITY LIMITATIONS: lifting, bending, squatting, and stairs  PARTICIPATION LIMITATIONS: community activity and yard work  PERSONAL FACTORS: Past/current experiences, Time since onset of injury/illness/exacerbation, and 3+ comorbidities: lumbar spondylolisthesis, s/p lumbar fusion, CAD  are also affecting patient's functional outcome.   REHAB POTENTIAL: Good  CLINICAL DECISION MAKING: Evolving/moderate complexity  EVALUATION COMPLEXITY: Moderate   GOALS: Goals reviewed with patient? Yes  SHORT TERM GOALS: Target date: 07/02/2023  Patient will be independent with initial HEP. Baseline: Goal status: INITIAL  2.  Pt will report at least a 30% improvement in symptoms since initial evaluation. Baseline:  Goal status: INITIAL  3.  Pt to report ability to go chip some golf balls in a putting green or simulation of that activity without increased pain. Baseline:  Goal status: INITIAL    LONG TERM GOALS: Target date:  09/06/2023  Pt will be independent with advanced HEP. Baseline:  Goal status: INITIAL  2.  Patient will increase FOTO score to 63 to demonstrate improvement in functional mobility/tasks. Baseline: 52 Goal status: INITIAL  3.  Pt will increase left hip strength to at least 5-/5 to allow him to return to exercising in the gym and easier stair navigation. Baseline:  Goal status: INITIAL  4.  Patient will increase left single leg stance to greater than 30 sec with min/moderate balance compensations/strategies to decrease risk of falling. Baseline:  Goal status: INITIAL  5.  Patient will report ability to return to playing golf or going to the driving range without increased pain. Baseline:  Goal status: INITIAL    PLAN:  PT FREQUENCY: 2x/week  PT DURATION: 8 weeks  PLANNED INTERVENTIONS: Therapeutic exercises, Therapeutic activity, Neuromuscular re-education, Balance training, Gait training, Patient/Family education, Self Care, Joint mobilization, Stair training, Vestibular training, Canalith repositioning, Aquatic Therapy, Dry Needling, Electrical stimulation, Spinal manipulation, Spinal mobilization, Cryotherapy, Moist heat, scar mobilization, Taping, Vasopneumatic device, Traction, Ultrasound, Ionotophoresis 4mg /ml Dexamethasone, Manual therapy, and Re-evaluation.  PLAN FOR NEXT SESSION: Assess and progress HEP as indicated, strengthening, flexibility   Reather Laurence, PT 07/16/2023, 11:09 AM   [facility] [address] [place], [place] [address] Phone # [phone]  Fax [phone]

## 2023-08-01 ENCOUNTER — Ambulatory Visit: Payer: Medicare PPO | Admitting: Rehabilitative and Restorative Service Providers"

## 2023-08-01 ENCOUNTER — Encounter: Payer: Self-pay | Admitting: Rehabilitative and Restorative Service Providers"

## 2023-08-01 DIAGNOSIS — R2689 Other abnormalities of gait and mobility: Secondary | ICD-10-CM | POA: Diagnosis not present

## 2023-08-01 DIAGNOSIS — M6281 Muscle weakness (generalized): Secondary | ICD-10-CM

## 2023-08-01 DIAGNOSIS — M5459 Other low back pain: Secondary | ICD-10-CM

## 2023-08-01 DIAGNOSIS — R262 Difficulty in walking, not elsewhere classified: Secondary | ICD-10-CM

## 2023-08-01 DIAGNOSIS — R252 Cramp and spasm: Secondary | ICD-10-CM

## 2023-08-01 NOTE — Therapy (Signed)
OUTPATIENT PHYSICAL THERAPY TREATMENT NOTE   Patient Name: Todd Kane MRN: [id number] DOB:[birth year], 67 y.o., male Today's Date: 08/01/2023  END OF SESSION:  PT End of Session - 08/01/23 0845     Visit Number 2    Date for PT Re-Evaluation 09/06/23    Authorization Type [employer]    Authorization Time Period 07/16/2023 - 09/06/2023    Authorization - Visit Number 2    Authorization - Number of Visits 16    Progress Note Due on Visit 10    PT Start Time 0843    PT Stop Time 0925    PT Time Calculation (min) 42 min    Activity Tolerance Patient tolerated treatment well    Behavior During Therapy [facility] for tasks assessed/performed             Past Medical History:  Diagnosis Date   Back pain    BPH (benign prostatic hyperplasia)    CAD (coronary artery disease)    a. mild-mod by cardiac CT 03/2017.   Chest pain    Hyperlipidemia    Hypertension    OSA (obstructive sleep apnea) 2013   Pre-diabetes    Spondylolisthesis    Past Surgical History:  Procedure Laterality Date   LAMINECTOMY WITH POSTERIOR LATERAL ARTHRODESIS LEVEL 1 N/A 05/06/2023   Procedure: POSTEROLATERAL FUSION LUMBAR THREE-LUMBAR FOUR;  Surgeon: Arman Bogus, MD;  Location: [facility] OR;  Service: Neurosurgery;  Laterality: N/A;   MOUTH SURGERY  1982   3rd molars   TONSILLECTOMY AND ADENOIDECTOMY  1959   Patient Active Problem List   Diagnosis Date Noted   S/P lumbar fusion 05/06/2023   Preoperative cardiovascular examination 04/10/2023   Pre-diabetes 08/03/2020   Coronary artery calcification seen on CAT scan 01/14/2018   Family history of early CAD 01/14/2018   Chest pain 01/29/2017   Chest pain of uncertain etiology 12/23/2014   Dyspnea on exertion 12/23/2014   Sciatica of right side 12/23/2014   Essential hypertension 12/23/2014   Hypercholesterolemia 12/23/2014    PCP: Farris Has, MD  REFERRING PROVIDER: Sherryl Manges, NP  REFERRING DIAG: M43.16  (ICD-10-CM) - Spondylolisthesis, lumbar region  Rationale for Evaluation and Treatment: Rehabilitation  THERAPY DIAG:  Other low back pain  Muscle weakness (generalized)  Cramp and spasm  Difficulty in walking, not elsewhere classified  Other abnormalities of gait and mobility  ONSET DATE: S/p PLIF L4-5 and posterior lateral fusion L3-4 on 05/06/2023 by Dr Yetta Barre  SUBJECTIVE:  SUBJECTIVE STATEMENT: Pt states that he was offered some waiting list appointments, but the timing didn't work, so he was not able to come in until today.  Patient reports that he is now working with a Systems analyst once a week.  He states that the first thing the trainer did was teach him proper ambulation and balance.  PERTINENT HISTORY:  S/p PLIF L4-5 and posterior lateral fusion L3-4 on 05/06/2023 by Dr Yetta Barre, Spondlyolisthesis, BPH, CAD  PAIN:  Are you having pain? Yes: NPRS scale: 1/10 Pain location: low back Pain description: sore Aggravating factors: prolonged periods in certain positions or prolonged ambulation Relieving factors: rest  PRECAUTIONS: Back  RED FLAGS: None   WEIGHT BEARING RESTRICTIONS: No  FALLS:  Has patient fallen in last 6 months? Yes. Number of falls 1 fall when he tripped on something approximately a month after surgery.  LIVING ENVIRONMENT: Lives with: lives with their spouse Lives in: House/apartment Stairs:  multilevel home Has following equipment at home: Dan Humphreys - 2 wheeled and Grab bars  OCCUPATION: Retired Cabin crew for 30 years   PLOF: Independent and Leisure: golfing  PATIENT GOALS: To return to the gym and to playing golf.  NEXT MD VISIT: "a couple of months" for follow up with Dr Yetta Barre  OBJECTIVE:   DIAGNOSTIC FINDINGS:  Lumbar Radiograph on  05/06/2023: IMPRESSION: Fluoroscopic assistance was provided for surgical fusion from L3-L5 levels.  PATIENT SURVEYS:  Eval:  FOTO 52 (projected 63 by visit 11)   COGNITION: Overall cognitive status: Within functional limits for tasks assessed     SENSATION: Pt reports some numbness and tingling in his feet, primarily left LE.  States that it is improved from what it is from prior to surgery.  MUSCLE LENGTH: Hamstrings: tightness bilat  POSTURE: forward head  PALPATION: Mild tenderness to palpation along lumbar spine  LUMBAR ROM:   Eval: Limited at least 25% with increased pain.  Limited by previous precautions from back surgery.  LOWER EXTREMITY ROM:     Eval:  WFL  LOWER EXTREMITY MMT:    Eval:  RLE strength is WFL, left hip strength is grossly 4 to 4+/5 throughout  FUNCTIONAL TESTS:  Eval: 5 times sit to stand: 11.74 sec Timed up and go (TUG): 8.99 sec Single Leg Stance:  right- >30 sec with min to mod balance corrections/strategies.  Left- >30 sec with excessive balance strategies/corrections)  GAIT: Distance walked: >500 ft Assistive device utilized: None Level of assistance: Complete Independence Comments: Pt reports that he usually stops after 15-20 minutes before he stops and reports discomfort with ambulation.  TODAY'S TREATMENT:                                                                                                                               DATE: 08/01/2023 Nustep level 5 x6 min with PT present to discuss status Standing hamstring stretch at stairs 2x20 sec bilat Standing L stretch at barre 2x20 sec FWD  and backwards monster walking with blue loop around ankles with unilateral UE support on barre x4 laps each direction Side stepping at barre with blue loop around ankles down and back x4 laps Supine bridge 2x10 Supine TA engagement with 90/90 heel tap 2x10 Supine double knees to chest 2x20 sec Sidelying clamshell with red tband 2x10  bilat Prone hip extension 2x10 bilat Quadruped TA contraction/indraw 2x10 Quadruped fire hydrant x10 bilat Quadruped alt UE/LE extension (bird dog) x10 Childs pose x20 sec Leg Press (seat at 8) 90# x15, 110# x15   DATE: 07/16/2023 Reviewed HEP and discussed PT goals  Nustep level 3 x5 min with PT present to discuss status   PATIENT EDUCATION:  Education details: Issued HEP Person educated: Patient Education method: Explanation, Demonstration, and Handouts Education comprehension: verbalized understanding and returned demonstration  HOME EXERCISE PROGRAM: Access Code: [email or web address] URL: [email or web address] Date: 08/01/2023 Prepared by: Clydie Braun Shaylee Stanislawski  Exercises - Seated Hamstring Stretch  - 1 x daily - 7 x weekly - 2 reps - 20 sec hold - Seated Piriformis Stretch with Trunk Bend  - 1 x daily - 7 x weekly - 2 reps - 20 sec hold - Supine Bridge  - 1 x daily - 7 x weekly - 2 sets - 10 reps - Supine 90/90 Alternating Heel Touches with Posterior Pelvic Tilt  - 1 x daily - 7 x weekly - 2 sets - 10 reps - Supine Double Knee to Chest  - 1 x daily - 7 x weekly - 2 reps - 20 sec hold - Clamshell with Resistance  - 1 x daily - 7 x weekly - 2 sets - 10 reps - Bird Dog  - 1 x daily - 7 x weekly - 1-2 sets - 10 reps - Quadruped Fire Hydrant  - 1 x daily - 7 x weekly - 1-2 sets - 10 reps - Quadruped Transversus Abdominis Bracing  - 1 x daily - 7 x weekly - 2 sets - 10 reps - Child's Pose Stretch  - 1 x daily - 7 x weekly - 2 reps - 20 sec hold - Side Stepping with Resistance at Ankles  - 1 x daily - 7 x weekly - 2 sets - 10 reps  ASSESSMENT:  CLINICAL IMPRESSION: Mr Hotte presents to skilled PT reporting that he has been able to do some putting on the putting green once, but has not tried more than that yet.  Patient has been compliant with HEP.  Patient able to progress with strengthening and core stability during session with some muscle fatigue noted, especially with side  stepping with resistance loop.  Patient provided with updated HEP during session and given new handouts.  Patient continues to require skilled PT to progress towards goal related activities.  OBJECTIVE IMPAIRMENTS: decreased balance, difficulty walking, decreased strength, increased muscle spasms, impaired flexibility, postural dysfunction, and pain.   ACTIVITY LIMITATIONS: lifting, bending, squatting, and stairs  PARTICIPATION LIMITATIONS: community activity and yard work  PERSONAL FACTORS: Past/current experiences, Time since onset of injury/illness/exacerbation, and 3+ comorbidities: lumbar spondylolisthesis, s/p lumbar fusion, CAD  are also affecting patient's functional outcome.   REHAB POTENTIAL: Good  CLINICAL DECISION MAKING: Evolving/moderate complexity  EVALUATION COMPLEXITY: Moderate   GOALS: Goals reviewed with patient? Yes  SHORT TERM GOALS: Target date: 08/02/2023  Patient will be independent with initial HEP. Baseline: Goal status: MET  2.  Pt will report at least a 30% improvement in symptoms since initial evaluation. Baseline:  Goal status: Ongoing  3.  Pt to report ability to go chip some golf balls in a putting green or simulation of that activity without increased pain. Baseline:  Goal status: Ongoing    LONG TERM GOALS: Target date: 09/06/2023  Pt will be independent with advanced HEP. Baseline:  Goal status: INITIAL  2.  Patient will increase FOTO score to 63 to demonstrate improvement in functional mobility/tasks. Baseline: 52 Goal status: INITIAL  3.  Pt will increase left hip strength to at least 5-/5 to allow him to return to exercising in the gym and easier stair navigation. Baseline:  Goal status: INITIAL  4.  Patient will increase left single leg stance to greater than 30 sec with min/moderate balance compensations/strategies to decrease risk of falling. Baseline:  Goal status: INITIAL  5.  Patient will report ability to return to  playing golf or going to the driving range without increased pain. Baseline:  Goal status: INITIAL    PLAN:  PT FREQUENCY: 2x/week  PT DURATION: 8 weeks  PLANNED INTERVENTIONS: Therapeutic exercises, Therapeutic activity, Neuromuscular re-education, Balance training, Gait training, Patient/Family education, Self Care, Joint mobilization, Stair training, Vestibular training, Canalith repositioning, Aquatic Therapy, Dry Needling, Electrical stimulation, Spinal manipulation, Spinal mobilization, Cryotherapy, Moist heat, scar mobilization, Taping, Vasopneumatic device, Traction, Ultrasound, Ionotophoresis 4mg /ml Dexamethasone, Manual therapy, and Re-evaluation.  PLAN FOR NEXT SESSION: Assess and progress HEP as indicated, strengthening, flexibility   Reather Laurence, PT, DPT 08/01/23, 10:15 AM  [facility] [address], [address] [place], [place] [address] Phone # [phone] Fax [phone]

## 2023-08-06 ENCOUNTER — Ambulatory Visit: Payer: Medicare PPO | Attending: Student | Admitting: Rehabilitative and Restorative Service Providers"

## 2023-08-06 ENCOUNTER — Encounter: Payer: Self-pay | Admitting: Rehabilitative and Restorative Service Providers"

## 2023-08-06 DIAGNOSIS — M6281 Muscle weakness (generalized): Secondary | ICD-10-CM | POA: Diagnosis not present

## 2023-08-06 DIAGNOSIS — R262 Difficulty in walking, not elsewhere classified: Secondary | ICD-10-CM

## 2023-08-06 DIAGNOSIS — R2689 Other abnormalities of gait and mobility: Secondary | ICD-10-CM | POA: Diagnosis not present

## 2023-08-06 DIAGNOSIS — M5459 Other low back pain: Secondary | ICD-10-CM

## 2023-08-06 DIAGNOSIS — R252 Cramp and spasm: Secondary | ICD-10-CM | POA: Diagnosis not present

## 2023-08-06 DIAGNOSIS — R293 Abnormal posture: Secondary | ICD-10-CM | POA: Insufficient documentation

## 2023-08-06 NOTE — Therapy (Signed)
OUTPATIENT PHYSICAL THERAPY TREATMENT NOTE   Patient Name: Todd Kane MRN: [id number] DOB:[birth year], 67 y.o., male Today's Date: 08/06/2023  END OF SESSION:  PT End of Session - 08/06/23 0851     Visit Number 3    Date for PT Re-Evaluation 09/06/23    Authorization Type [employer]    Authorization Time Period 07/16/2023 - 09/06/2023    Authorization - Visit Number 3    Authorization - Number of Visits 16    Progress Note Due on Visit 10    PT Start Time 0845    PT Stop Time 0925    PT Time Calculation (min) 40 min    Activity Tolerance Patient tolerated treatment well    Behavior During Therapy [facility] for tasks assessed/performed             Past Medical History:  Diagnosis Date   Back pain    BPH (benign prostatic hyperplasia)    CAD (coronary artery disease)    a. mild-mod by cardiac CT 03/2017.   Chest pain    Hyperlipidemia    Hypertension    OSA (obstructive sleep apnea) 2013   Pre-diabetes    Spondylolisthesis    Past Surgical History:  Procedure Laterality Date   LAMINECTOMY WITH POSTERIOR LATERAL ARTHRODESIS LEVEL 1 N/A 05/06/2023   Procedure: POSTEROLATERAL FUSION LUMBAR THREE-LUMBAR FOUR;  Surgeon: Arman Bogus, MD;  Location: [facility] OR;  Service: Neurosurgery;  Laterality: N/A;   MOUTH SURGERY  1982   3rd molars   TONSILLECTOMY AND ADENOIDECTOMY  1959   Patient Active Problem List   Diagnosis Date Noted   S/P lumbar fusion 05/06/2023   Preoperative cardiovascular examination 04/10/2023   Pre-diabetes 08/03/2020   Coronary artery calcification seen on CAT scan 01/14/2018   Family history of early CAD 01/14/2018   Chest pain 01/29/2017   Chest pain of uncertain etiology 12/23/2014   Dyspnea on exertion 12/23/2014   Sciatica of right side 12/23/2014   Essential hypertension 12/23/2014   Hypercholesterolemia 12/23/2014    PCP: Farris Has, MD  REFERRING PROVIDER: Sherryl Manges, NP  REFERRING DIAG: M43.16  (ICD-10-CM) - Spondylolisthesis, lumbar region  Rationale for Evaluation and Treatment: Rehabilitation  THERAPY DIAG:  Other low back pain  Muscle weakness (generalized)  Cramp and spasm  Difficulty in walking, not elsewhere classified  Other abnormalities of gait and mobility  ONSET DATE: S/p PLIF L4-5 and posterior lateral fusion L3-4 on 05/06/2023 by Dr Yetta Barre  SUBJECTIVE:  SUBJECTIVE STATEMENT: Pt states that he is doing well, he is working on isometrics with his Systems analyst.  Pt reports that he is feeling at least 10% improvement since starting PT.  "My legs are still weak."  PERTINENT HISTORY:  S/p PLIF L4-5 and posterior lateral fusion L3-4 on 05/06/2023 by Dr Yetta Barre, Spondlyolisthesis, BPH, CAD  PAIN:  Are you having pain? Yes: NPRS scale: 0-1/10 Pain location: low back Pain description: sore Aggravating factors: prolonged periods in certain positions or prolonged ambulation Relieving factors: rest  PRECAUTIONS: Back  RED FLAGS: None   WEIGHT BEARING RESTRICTIONS: No  FALLS:  Has patient fallen in last 6 months? Yes. Number of falls 1 fall when he tripped on something approximately a month after surgery.  LIVING ENVIRONMENT: Lives with: lives with their spouse Lives in: House/apartment Stairs:  multilevel home Has following equipment at home: Dan Humphreys - 2 wheeled and Grab bars  OCCUPATION: Retired Cabin crew for 30 years   PLOF: Independent and Leisure: golfing  PATIENT GOALS: To return to the gym and to playing golf.  NEXT MD VISIT: "a couple of months" for follow up with Dr Yetta Barre  OBJECTIVE:   DIAGNOSTIC FINDINGS:  Lumbar Radiograph on 05/06/2023: IMPRESSION: Fluoroscopic assistance was provided for surgical fusion from L3-L5 levels.  PATIENT SURVEYS:  Eval:  FOTO 52  (projected 63 by visit 11)   COGNITION: Overall cognitive status: Within functional limits for tasks assessed     SENSATION: Pt reports some numbness and tingling in his feet, primarily left LE.  States that it is improved from what it is from prior to surgery.  MUSCLE LENGTH: Hamstrings: tightness bilat  POSTURE: forward head  PALPATION: Mild tenderness to palpation along lumbar spine  LUMBAR ROM:   Eval: Limited at least 25% with increased pain.  Limited by previous precautions from back surgery.  LOWER EXTREMITY ROM:     Eval:  WFL  LOWER EXTREMITY MMT:    Eval:  RLE strength is WFL, left hip strength is grossly 4 to 4+/5 throughout  FUNCTIONAL TESTS:  Eval: 5 times sit to stand: 11.74 sec Timed up and go (TUG): 8.99 sec Single Leg Stance:  right- >30 sec with min to mod balance corrections/strategies.  Left- >30 sec with excessive balance strategies/corrections)  GAIT: Distance walked: >500 ft Assistive device utilized: None Level of assistance: Complete Independence Comments: Pt reports that he usually stops after 15-20 minutes before he stops and reports discomfort with ambulation.  TODAY'S TREATMENT:                                                                                                                               DATE: 08/01/2023 Nustep level 5 x7 min with PT present to discuss status Seated hamstring stretch at stairs 2x20 sec bilat Supine bridge 2x10 Supine TA engagement with 90/90 heel tap 2x10 Supine double knees to chest 2x20 sec Sidelying clamshell with yellow loop 2x10 bilat Quadruped TA contraction/indraw 2x10  Marjo Bicker pose x20 sec Quadruped alt UE/LE extension (bird dog) x10 Quadruped fire hydrant 2x10 bilat Childs pose x20 sec Side stepping at barre with blue loop around ankles down and back x4 laps FWD and backwards monster walking with blue loop around ankles with unilateral UE support on barre x4 laps each direction Leg Press (seat  at 8) 110# x15, 115# x15   DATE: 08/01/2023 Nustep level 5 x6 min with PT present to discuss status Standing hamstring stretch at stairs 2x20 sec bilat Standing L stretch at barre 2x20 sec FWD and backwards monster walking with blue loop around ankles with unilateral UE support on barre x4 laps each direction Side stepping at barre with blue loop around ankles down and back x4 laps Supine bridge 2x10 Supine TA engagement with 90/90 heel tap 2x10 Supine double knees to chest 2x20 sec Sidelying clamshell with red tband 2x10 bilat Prone hip extension 2x10 bilat Quadruped TA contraction/indraw 2x10 Quadruped fire hydrant x10 bilat Quadruped alt UE/LE extension (bird dog) x10 Childs pose x20 sec Leg Press (seat at 8) 90# x15, 110# x15, 120# x15    PATIENT EDUCATION:  Education details: Issued HEP Person educated: Patient Education method: Explanation, Demonstration, and Handouts Education comprehension: verbalized understanding and returned demonstration  HOME EXERCISE PROGRAM: Access Code: [email or web address] URL: [email or web address] Date: 08/01/2023 Prepared by: Clydie Braun Astin Sayre  Exercises - Seated Hamstring Stretch  - 1 x daily - 7 x weekly - 2 reps - 20 sec hold - Seated Piriformis Stretch with Trunk Bend  - 1 x daily - 7 x weekly - 2 reps - 20 sec hold - Supine Bridge  - 1 x daily - 7 x weekly - 2 sets - 10 reps - Supine 90/90 Alternating Heel Touches with Posterior Pelvic Tilt  - 1 x daily - 7 x weekly - 2 sets - 10 reps - Supine Double Knee to Chest  - 1 x daily - 7 x weekly - 2 reps - 20 sec hold - Clamshell with Resistance  - 1 x daily - 7 x weekly - 2 sets - 10 reps - Bird Dog  - 1 x daily - 7 x weekly - 1-2 sets - 10 reps - Quadruped Fire Hydrant  - 1 x daily - 7 x weekly - 1-2 sets - 10 reps - Quadruped Transversus Abdominis Bracing  - 1 x daily - 7 x weekly - 2 sets - 10 reps - Child's Pose Stretch  - 1 x daily - 7 x weekly - 2 reps - 20 sec hold - Side Stepping  with Resistance at Ankles  - 1 x daily - 7 x weekly - 2 sets - 10 reps  ASSESSMENT:  CLINICAL IMPRESSION: Mr Busta presents to skilled PT reporting at least 10% improvement since initial evaluation.  Patient states that she has been working on isometrics and backwards walking with Systems analyst. Patient able to progress with core strengthening and hip stability during session.  Patient only requires minimal cuing throughout for improved technique.  Patient able to progress on weight with leg press during session.  OBJECTIVE IMPAIRMENTS: decreased balance, difficulty walking, decreased strength, increased muscle spasms, impaired flexibility, postural dysfunction, and pain.   ACTIVITY LIMITATIONS: lifting, bending, squatting, and stairs  PARTICIPATION LIMITATIONS: community activity and yard work  PERSONAL FACTORS: Past/current experiences, Time since onset of injury/illness/exacerbation, and 3+ comorbidities: lumbar spondylolisthesis, s/p lumbar fusion, CAD  are also affecting patient's functional outcome.   REHAB POTENTIAL: Good  CLINICAL DECISION MAKING: Evolving/moderate complexity  EVALUATION COMPLEXITY: Moderate   GOALS: Goals reviewed with patient? Yes  SHORT TERM GOALS: Target date: 08/02/2023  Patient will be independent with initial HEP. Baseline: Goal status: MET  2.  Pt will report at least a 30% improvement in symptoms since initial evaluation. Baseline:  Goal status: Ongoing  3.  Pt to report ability to go chip some golf balls in a putting green or simulation of that activity without increased pain. Baseline:  Goal status: Ongoing    LONG TERM GOALS: Target date: 09/06/2023  Pt will be independent with advanced HEP. Baseline:  Goal status: INITIAL  2.  Patient will increase FOTO score to 63 to demonstrate improvement in functional mobility/tasks. Baseline: 52 Goal status: INITIAL  3.  Pt will increase left hip strength to at least 5-/5 to allow  him to return to exercising in the gym and easier stair navigation. Baseline:  Goal status: INITIAL  4.  Patient will increase left single leg stance to greater than 30 sec with min/moderate balance compensations/strategies to decrease risk of falling. Baseline:  Goal status: INITIAL  5.  Patient will report ability to return to playing golf or going to the driving range without increased pain. Baseline:  Goal status: INITIAL    PLAN:  PT FREQUENCY: 2x/week  PT DURATION: 8 weeks  PLANNED INTERVENTIONS: Therapeutic exercises, Therapeutic activity, Neuromuscular re-education, Balance training, Gait training, Patient/Family education, Self Care, Joint mobilization, Stair training, Vestibular training, Canalith repositioning, Aquatic Therapy, Dry Needling, Electrical stimulation, Spinal manipulation, Spinal mobilization, Cryotherapy, Moist heat, scar mobilization, Taping, Vasopneumatic device, Traction, Ultrasound, Ionotophoresis 4mg /ml Dexamethasone, Manual therapy, and Re-evaluation.  PLAN FOR NEXT SESSION: Assess and progress HEP as indicated, strengthening, flexibility   Reather Laurence, PT, DPT 08/06/23, 9:29 AM  [facility] [address] [place], [place] [address] Phone # [phone] Fax [phone]

## 2023-08-08 ENCOUNTER — Ambulatory Visit: Payer: Medicare PPO | Admitting: Rehabilitative and Restorative Service Providers"

## 2023-08-08 ENCOUNTER — Encounter: Payer: Self-pay | Admitting: Rehabilitative and Restorative Service Providers"

## 2023-08-08 DIAGNOSIS — R252 Cramp and spasm: Secondary | ICD-10-CM | POA: Diagnosis not present

## 2023-08-08 DIAGNOSIS — M6281 Muscle weakness (generalized): Secondary | ICD-10-CM

## 2023-08-08 DIAGNOSIS — R2689 Other abnormalities of gait and mobility: Secondary | ICD-10-CM

## 2023-08-08 DIAGNOSIS — R262 Difficulty in walking, not elsewhere classified: Secondary | ICD-10-CM | POA: Diagnosis not present

## 2023-08-08 DIAGNOSIS — M5459 Other low back pain: Secondary | ICD-10-CM

## 2023-08-08 DIAGNOSIS — R293 Abnormal posture: Secondary | ICD-10-CM | POA: Diagnosis not present

## 2023-08-08 NOTE — Therapy (Signed)
OUTPATIENT PHYSICAL THERAPY TREATMENT NOTE   Patient Name: Todd Kane MRN: [id number] DOB:1955-12-22, 67 y.o., male Today's Date: 08/08/2023  END OF SESSION:  PT End of Session - 08/08/23 0846     Visit Number 4    Date for PT Re-Evaluation 09/06/23    Authorization Type [employer]    Authorization Time Period 07/16/2023 - 09/06/2023    Authorization - Visit Number 4    Authorization - Number of Visits 16    Progress Note Due on Visit 10    PT Start Time 0845    PT Stop Time 0925    PT Time Calculation (min) 40 min    Activity Tolerance Patient tolerated treatment well    Behavior During Therapy [facility] for tasks assessed/performed             Past Medical History:  Diagnosis Date   Back pain    BPH (benign prostatic hyperplasia)    CAD (coronary artery disease)    a. mild-mod by cardiac CT 03/2017.   Chest pain    Hyperlipidemia    Hypertension    OSA (obstructive sleep apnea) 2013   Pre-diabetes    Spondylolisthesis    Past Surgical History:  Procedure Laterality Date   LAMINECTOMY WITH POSTERIOR LATERAL ARTHRODESIS LEVEL 1 N/A 05/06/2023   Procedure: POSTEROLATERAL FUSION LUMBAR THREE-LUMBAR FOUR;  Surgeon: Arman Bogus, MD;  Location: [facility] OR;  Service: Neurosurgery;  Laterality: N/A;   MOUTH SURGERY  1982   3rd molars   TONSILLECTOMY AND ADENOIDECTOMY  1959   Patient Active Problem List   Diagnosis Date Noted   S/P lumbar fusion 05/06/2023   Preoperative cardiovascular examination 04/10/2023   Pre-diabetes 08/03/2020   Coronary artery calcification seen on CAT scan 01/14/2018   Family history of early CAD 01/14/2018   Chest pain 01/29/2017   Chest pain of uncertain etiology 12/23/2014   Dyspnea on exertion 12/23/2014   Sciatica of right side 12/23/2014   Essential hypertension 12/23/2014   Hypercholesterolemia 12/23/2014    PCP: Farris Has, MD  REFERRING PROVIDER: Sherryl Manges, NP  REFERRING DIAG: M43.16  (ICD-10-CM) - Spondylolisthesis, lumbar region  Rationale for Evaluation and Treatment: Rehabilitation  THERAPY DIAG:  Other low back pain  Muscle weakness (generalized)  Cramp and spasm  Difficulty in walking, not elsewhere classified  Other abnormalities of gait and mobility  ONSET DATE: S/p PLIF L4-5 and posterior lateral fusion L3-4 on 05/06/2023 by Dr Yetta Barre  SUBJECTIVE:  SUBJECTIVE STATEMENT: Pt states that he is doing well, he is working on isometrics with his Systems analyst.  Pt reports that he is feeling at least 10% improvement since starting PT.  "My legs are still weak."  PERTINENT HISTORY:  S/p PLIF L4-5 and posterior lateral fusion L3-4 on 05/06/2023 by Dr Yetta Barre, Spondlyolisthesis, BPH, CAD  PAIN:  Are you having pain? Yes: NPRS scale: 0-1/10 Pain location: low back Pain description: sore Aggravating factors: prolonged periods in certain positions or prolonged ambulation Relieving factors: rest  PRECAUTIONS: Back  RED FLAGS: None   WEIGHT BEARING RESTRICTIONS: No  FALLS:  Has patient fallen in last 6 months? Yes. Number of falls 1 fall when he tripped on something approximately a month after surgery.  LIVING ENVIRONMENT: Lives with: lives with their spouse Lives in: House/apartment Stairs:  multilevel home Has following equipment at home: Dan Humphreys - 2 wheeled and Grab bars  OCCUPATION: Retired Cabin crew for 30 years   PLOF: Independent and Leisure: golfing  PATIENT GOALS: To return to the gym and to playing golf.  NEXT MD VISIT: "a couple of months" for follow up with Dr Yetta Barre  OBJECTIVE:   DIAGNOSTIC FINDINGS:  Lumbar Radiograph on 05/06/2023: IMPRESSION: Fluoroscopic assistance was provided for surgical fusion from L3-L5 levels.  PATIENT SURVEYS:  Eval:  FOTO 52  (projected 63 by visit 11)   COGNITION: Overall cognitive status: Within functional limits for tasks assessed     SENSATION: Pt reports some numbness and tingling in his feet, primarily left LE.  States that it is improved from what it is from prior to surgery.  MUSCLE LENGTH: Hamstrings: tightness bilat  POSTURE: forward head  PALPATION: Mild tenderness to palpation along lumbar spine  LUMBAR ROM:   Eval: Limited at least 25% with increased pain.  Limited by previous precautions from back surgery.  LOWER EXTREMITY ROM:     Eval:  WFL  LOWER EXTREMITY MMT:    Eval:  RLE strength is WFL, left hip strength is grossly 4 to 4+/5 throughout  FUNCTIONAL TESTS:  Eval: 5 times sit to stand: 11.74 sec Timed up and go (TUG): 8.99 sec Single Leg Stance:  right- >30 sec with min to mod balance corrections/strategies.  Left- >30 sec with excessive balance strategies/corrections)  GAIT: Distance walked: >500 ft Assistive device utilized: None Level of assistance: Complete Independence Comments: Pt reports that he usually stops after 15-20 minutes before he stops and reports discomfort with ambulation.  TODAY'S TREATMENT:                                                                                                                               DATE: 08/08/2023 Nustep level 5 x6 min with PT present to discuss status Standing hamstring stretch at stairs 2x20 sec bilat Standing rocker board for DF/PF x2 min Leg Press (seat at 8) 120# x15, 130# x15 Hip Matrix 40#:  hip abduction and hip extension.  2x10 each bilat  Hip Matrix 55# hip flexion 2x10 each bilat Standing diagonal chops with 10# cable pulley 2x10 bilat Seated lat pull-down 55# 2x10 Seated rows 55# 2x10 Seated piriformis stretch 2x20 sec bilat   DATE: 08/06/2023 Nustep level 5 x7 min with PT present to discuss status Seated hamstring stretch at stairs 2x20 sec bilat Supine bridge 2x10 Supine TA engagement with 90/90  heel tap 2x10 Supine double knees to chest 2x20 sec Sidelying clamshell with yellow loop 2x10 bilat Quadruped TA contraction/indraw 2x10 Childs pose x20 sec Quadruped alt UE/LE extension (bird dog) x10 Quadruped fire hydrant 2x10 bilat Childs pose x20 sec Side stepping at barre with blue loop around ankles down and back x4 laps FWD and backwards monster walking with blue loop around ankles with unilateral UE support on barre x4 laps each direction Leg Press (seat at 8) 110# x15, 115# x15   DATE: 08/01/2023 Nustep level 5 x6 min with PT present to discuss status Standing hamstring stretch at stairs 2x20 sec bilat Standing L stretch at barre 2x20 sec FWD and backwards monster walking with blue loop around ankles with unilateral UE support on barre x4 laps each direction Side stepping at barre with blue loop around ankles down and back x4 laps Supine bridge 2x10 Supine TA engagement with 90/90 heel tap 2x10 Supine double knees to chest 2x20 sec Sidelying clamshell with red tband 2x10 bilat Prone hip extension 2x10 bilat Quadruped TA contraction/indraw 2x10 Quadruped fire hydrant x10 bilat Quadruped alt UE/LE extension (bird dog) x10 Childs pose x20 sec Leg Press (seat at 8) 90# x15, 110# x15, 120# x15    PATIENT EDUCATION:  Education details: Issued HEP Person educated: Patient Education method: Explanation, Demonstration, and Handouts Education comprehension: verbalized understanding and returned demonstration  HOME EXERCISE PROGRAM: Access Code: [email or web address] URL: [email or web address] Date: 08/01/2023 Prepared by: Clydie Braun Lariyah Shetterly  Exercises - Seated Hamstring Stretch  - 1 x daily - 7 x weekly - 2 reps - 20 sec hold - Seated Piriformis Stretch with Trunk Bend  - 1 x daily - 7 x weekly - 2 reps - 20 sec hold - Supine Bridge  - 1 x daily - 7 x weekly - 2 sets - 10 reps - Supine 90/90 Alternating Heel Touches with Posterior Pelvic Tilt  - 1 x daily - 7 x weekly - 2  sets - 10 reps - Supine Double Knee to Chest  - 1 x daily - 7 x weekly - 2 reps - 20 sec hold - Clamshell with Resistance  - 1 x daily - 7 x weekly - 2 sets - 10 reps - Bird Dog  - 1 x daily - 7 x weekly - 1-2 sets - 10 reps - Quadruped Fire Hydrant  - 1 x daily - 7 x weekly - 1-2 sets - 10 reps - Quadruped Transversus Abdominis Bracing  - 1 x daily - 7 x weekly - 2 sets - 10 reps - Child's Pose Stretch  - 1 x daily - 7 x weekly - 2 reps - 20 sec hold - Side Stepping with Resistance at Ankles  - 1 x daily - 7 x weekly - 2 sets - 10 reps  ASSESSMENT:  CLINICAL IMPRESSION: Mr Maione presents to skilled PT reporting he does not have pain this morning.  Patient able to progress with weight machines during session.  Patient requires minimal cuing for posture and technique during session.  Patient continues to require skilled PT to progress towards goal related activities.  OBJECTIVE IMPAIRMENTS: decreased  balance, difficulty walking, decreased strength, increased muscle spasms, impaired flexibility, postural dysfunction, and pain.   ACTIVITY LIMITATIONS: lifting, bending, squatting, and stairs  PARTICIPATION LIMITATIONS: community activity and yard work  PERSONAL FACTORS: Past/current experiences, Time since onset of injury/illness/exacerbation, and 3+ comorbidities: lumbar spondylolisthesis, s/p lumbar fusion, CAD  are also affecting patient's functional outcome.   REHAB POTENTIAL: Good  CLINICAL DECISION MAKING: Evolving/moderate complexity  EVALUATION COMPLEXITY: Moderate   GOALS: Goals reviewed with patient? Yes  SHORT TERM GOALS: Target date: 08/02/2023  Patient will be independent with initial HEP. Baseline: Goal status: MET  2.  Pt will report at least a 30% improvement in symptoms since initial evaluation. Baseline:  Goal status: Ongoing  3.  Pt to report ability to go chip some golf balls in a putting green or simulation of that activity without increased  pain. Baseline:  Goal status: Met    LONG TERM GOALS: Target date: 09/06/2023  Pt will be independent with advanced HEP. Baseline:  Goal status: INITIAL  2.  Patient will increase FOTO score to 63 to demonstrate improvement in functional mobility/tasks. Baseline: 52 Goal status: INITIAL  3.  Pt will increase left hip strength to at least 5-/5 to allow him to return to exercising in the gym and easier stair navigation. Baseline:  Goal status: INITIAL  4.  Patient will increase left single leg stance to greater than 30 sec with min/moderate balance compensations/strategies to decrease risk of falling. Baseline:  Goal status: INITIAL  5.  Patient will report ability to return to playing golf or going to the driving range without increased pain. Baseline:  Goal status: INITIAL    PLAN:  PT FREQUENCY: 2x/week  PT DURATION: 8 weeks  PLANNED INTERVENTIONS: Therapeutic exercises, Therapeutic activity, Neuromuscular re-education, Balance training, Gait training, Patient/Family education, Self Care, Joint mobilization, Stair training, Vestibular training, Canalith repositioning, Aquatic Therapy, Dry Needling, Electrical stimulation, Spinal manipulation, Spinal mobilization, Cryotherapy, Moist heat, scar mobilization, Taping, Vasopneumatic device, Traction, Ultrasound, Ionotophoresis 4mg /ml Dexamethasone, Manual therapy, and Re-evaluation.  PLAN FOR NEXT SESSION: Assess and progress HEP as indicated, strengthening, flexibility   Reather Laurence, PT, DPT 08/08/23, 9:55 AM  [facility] [address], [address] [place], [place] [address] Phone # [phone] Fax [phone]

## 2023-08-14 ENCOUNTER — Ambulatory Visit: Payer: Medicare PPO | Admitting: Rehabilitative and Restorative Service Providers"

## 2023-08-14 ENCOUNTER — Encounter: Payer: Self-pay | Admitting: Rehabilitative and Restorative Service Providers"

## 2023-08-14 DIAGNOSIS — R252 Cramp and spasm: Secondary | ICD-10-CM | POA: Diagnosis not present

## 2023-08-14 DIAGNOSIS — R2689 Other abnormalities of gait and mobility: Secondary | ICD-10-CM | POA: Diagnosis not present

## 2023-08-14 DIAGNOSIS — M5459 Other low back pain: Secondary | ICD-10-CM

## 2023-08-14 DIAGNOSIS — M6281 Muscle weakness (generalized): Secondary | ICD-10-CM | POA: Diagnosis not present

## 2023-08-14 DIAGNOSIS — R293 Abnormal posture: Secondary | ICD-10-CM | POA: Diagnosis not present

## 2023-08-14 DIAGNOSIS — R262 Difficulty in walking, not elsewhere classified: Secondary | ICD-10-CM

## 2023-08-14 NOTE — Therapy (Signed)
OUTPATIENT PHYSICAL THERAPY TREATMENT NOTE   Patient Name: Todd Kane MRN: [id number] DOB:13-May-1956, 67 y.o., male Today's Date: 08/14/2023  END OF SESSION:  PT End of Session - 08/14/23 0935     Visit Number 5    Date for PT Re-Evaluation 09/06/23    Authorization Type [employer]    Authorization Time Period 07/16/2023 - 09/06/2023    Authorization - Visit Number 5    Authorization - Number of Visits 16    Progress Note Due on Visit 10    PT Start Time 0930    PT Stop Time 1010    PT Time Calculation (min) 40 min    Activity Tolerance Patient tolerated treatment well    Behavior During Therapy [facility] for tasks assessed/performed             Past Medical History:  Diagnosis Date   Back pain    BPH (benign prostatic hyperplasia)    CAD (coronary artery disease)    a. mild-mod by cardiac CT 03/2017.   Chest pain    Hyperlipidemia    Hypertension    OSA (obstructive sleep apnea) 2013   Pre-diabetes    Spondylolisthesis    Past Surgical History:  Procedure Laterality Date   LAMINECTOMY WITH POSTERIOR LATERAL ARTHRODESIS LEVEL 1 N/A 05/06/2023   Procedure: POSTEROLATERAL FUSION LUMBAR THREE-LUMBAR FOUR;  Surgeon: Arman Bogus, MD;  Location: [facility] OR;  Service: Neurosurgery;  Laterality: N/A;   MOUTH SURGERY  1982   3rd molars   TONSILLECTOMY AND ADENOIDECTOMY  1959   Patient Active Problem List   Diagnosis Date Noted   S/P lumbar fusion 05/06/2023   Preoperative cardiovascular examination 04/10/2023   Pre-diabetes 08/03/2020   Coronary artery calcification seen on CAT scan 01/14/2018   Family history of early CAD 01/14/2018   Chest pain 01/29/2017   Chest pain of uncertain etiology 12/23/2014   Dyspnea on exertion 12/23/2014   Sciatica of right side 12/23/2014   Essential hypertension 12/23/2014   Hypercholesterolemia 12/23/2014    PCP: Farris Has, MD  REFERRING PROVIDER: Sherryl Manges, NP  REFERRING DIAG: M43.16  (ICD-10-CM) - Spondylolisthesis, lumbar region  Rationale for Evaluation and Treatment: Rehabilitation  THERAPY DIAG:  Other low back pain  Muscle weakness (generalized)  Cramp and spasm  Difficulty in walking, not elsewhere classified  Other abnormalities of gait and mobility  ONSET DATE: S/p PLIF L4-5 and posterior lateral fusion L3-4 on 05/06/2023 by Dr Yetta Barre  SUBJECTIVE:  SUBJECTIVE STATEMENT: Pt states that he is overall feeling at least 20% better since starting PT.  PERTINENT HISTORY:  S/p PLIF L4-5 and posterior lateral fusion L3-4 on 05/06/2023 by Dr Yetta Barre, Spondlyolisthesis, BPH, CAD  PAIN:  Are you having pain? Yes: NPRS scale: 0-1/10 Pain location: low back Pain description: sore Aggravating factors: prolonged periods in certain positions or prolonged ambulation Relieving factors: rest  PRECAUTIONS: Back  RED FLAGS: None   WEIGHT BEARING RESTRICTIONS: No  FALLS:  Has patient fallen in last 6 months? Yes. Number of falls 1 fall when he tripped on something approximately a month after surgery.  LIVING ENVIRONMENT: Lives with: lives with their spouse Lives in: House/apartment Stairs:  multilevel home Has following equipment at home: Dan Humphreys - 2 wheeled and Grab bars  OCCUPATION: Retired Cabin crew for 30 years   PLOF: Independent and Leisure: golfing  PATIENT GOALS: To return to the gym and to playing golf.  NEXT MD VISIT: "a couple of months" for follow up with Dr Yetta Barre  OBJECTIVE:   DIAGNOSTIC FINDINGS:  Lumbar Radiograph on 05/06/2023: IMPRESSION: Fluoroscopic assistance was provided for surgical fusion from L3-L5 levels.  PATIENT SURVEYS:  Eval:  FOTO 52 (projected 63 by visit 11)   COGNITION: Overall cognitive status: Within functional limits for tasks  assessed     SENSATION: Pt reports some numbness and tingling in his feet, primarily left LE.  States that it is improved from what it is from prior to surgery.  MUSCLE LENGTH: Hamstrings: tightness bilat  POSTURE: forward head  PALPATION: Mild tenderness to palpation along lumbar spine  LUMBAR ROM:   Eval: Limited at least 25% with increased pain.  Limited by previous precautions from back surgery.  LOWER EXTREMITY ROM:     Eval:  WFL  LOWER EXTREMITY MMT:    Eval:  RLE strength is WFL, left hip strength is grossly 4 to 4+/5 throughout  FUNCTIONAL TESTS:  Eval: 5 times sit to stand: 11.74 sec Timed up and go (TUG): 8.99 sec Single Leg Stance:  right- >30 sec with min to mod balance corrections/strategies.  Left- >30 sec with excessive balance strategies/corrections)  GAIT: Distance walked: >500 ft Assistive device utilized: None Level of assistance: Complete Independence Comments: Pt reports that he usually stops after 15-20 minutes before he stops and reports discomfort with ambulation.  TODAY'S TREATMENT:                                                                                                                               DATE: 08/14/2023 Nustep level 5 x6 min with PT present to discuss status Standing hamstring stretch at stairs 2x20 sec bilat Seated lat pull-down 55# 2x10 Standing diagonal chops with 10# cable pulley 2x10 bilat Seated rows 55# 2x10 Leg Press (seat at 8) 140# 2x15 Hip Matrix 45#:  hip abduction and hip extension.  2x10 each bilat Seated piriformis stretch 2x20 sec bilat Seated blue pball rollout 5x10 sec Quadruped  alt UE/LE extension (bird dog) 2x10   DATE: 08/08/2023 Nustep level 5 x6 min with PT present to discuss status Standing hamstring stretch at stairs 2x20 sec bilat Standing rocker board for DF/PF x2 min Leg Press (seat at 8) 120# x15, 130# x15 Hip Matrix 40#:  hip abduction and hip extension.  2x10 each bilat Hip Matrix 55#  hip flexion 2x10 each bilat Standing diagonal chops with 10# cable pulley 2x10 bilat Seated lat pull-down 55# 2x10 Seated rows 55# 2x10 Seated piriformis stretch 2x20 sec bilat    DATE: 08/06/2023 Nustep level 5 x7 min with PT present to discuss status Seated hamstring stretch at stairs 2x20 sec bilat Supine bridge 2x10 Supine TA engagement with 90/90 heel tap 2x10 Supine double knees to chest 2x20 sec Sidelying clamshell with yellow loop 2x10 bilat Quadruped TA contraction/indraw 2x10 Childs pose x20 sec Quadruped alt UE/LE extension (bird dog) x10 Quadruped fire hydrant 2x10 bilat Childs pose x20 sec Side stepping at barre with blue loop around ankles down and back x4 laps FWD and backwards monster walking with blue loop around ankles with unilateral UE support on barre x4 laps each direction Leg Press (seat at 8) 110# x15, 115# x15     PATIENT EDUCATION:  Education details: Issued HEP Person educated: Patient Education method: Explanation, Demonstration, and Handouts Education comprehension: verbalized understanding and returned demonstration  HOME EXERCISE PROGRAM: Access Code: [email or web address] URL: [email or web address] Date: 08/01/2023 Prepared by: Clydie Braun Cailan Antonucci  Exercises - Seated Hamstring Stretch  - 1 x daily - 7 x weekly - 2 reps - 20 sec hold - Seated Piriformis Stretch with Trunk Bend  - 1 x daily - 7 x weekly - 2 reps - 20 sec hold - Supine Bridge  - 1 x daily - 7 x weekly - 2 sets - 10 reps - Supine 90/90 Alternating Heel Touches with Posterior Pelvic Tilt  - 1 x daily - 7 x weekly - 2 sets - 10 reps - Supine Double Knee to Chest  - 1 x daily - 7 x weekly - 2 reps - 20 sec hold - Clamshell with Resistance  - 1 x daily - 7 x weekly - 2 sets - 10 reps - Bird Dog  - 1 x daily - 7 x weekly - 1-2 sets - 10 reps - Quadruped Fire Hydrant  - 1 x daily - 7 x weekly - 1-2 sets - 10 reps - Quadruped Transversus Abdominis Bracing  - 1 x daily - 7 x weekly - 2  sets - 10 reps - Child's Pose Stretch  - 1 x daily - 7 x weekly - 2 reps - 20 sec hold - Side Stepping with Resistance at Ankles  - 1 x daily - 7 x weekly - 2 sets - 10 reps  ASSESSMENT:  CLINICAL IMPRESSION: Mr Rawl presents to skilled PT reporting he does not have pain this morning, but did state 1/10 pain with nustep.  Patient is progressing towards overall increased flexibility and core stability.  Patient continues to progress with strengthening exercises and increased weight.  Patient requires less cuing for technique each visit.  Patient is progressing appropriately towards goals.   OBJECTIVE IMPAIRMENTS: decreased balance, difficulty walking, decreased strength, increased muscle spasms, impaired flexibility, postural dysfunction, and pain.   ACTIVITY LIMITATIONS: lifting, bending, squatting, and stairs  PARTICIPATION LIMITATIONS: community activity and yard work  PERSONAL FACTORS: Past/current experiences, Time since onset of injury/illness/exacerbation, and 3+ comorbidities: lumbar spondylolisthesis, s/p lumbar fusion, CAD  are also  affecting patient's functional outcome.   REHAB POTENTIAL: Good  CLINICAL DECISION MAKING: Evolving/moderate complexity  EVALUATION COMPLEXITY: Moderate   GOALS: Goals reviewed with patient? Yes  SHORT TERM GOALS: Target date: 08/02/2023  Patient will be independent with initial HEP. Baseline: Goal status: MET  2.  Pt will report at least a 30% improvement in symptoms since initial evaluation. Baseline:  Goal status: Ongoing  3.  Pt to report ability to go chip some golf balls in a putting green or simulation of that activity without increased pain. Baseline:  Goal status: Met    LONG TERM GOALS: Target date: 09/06/2023  Pt will be independent with advanced HEP. Baseline:  Goal status: Ongoing  2.  Patient will increase FOTO score to 63 to demonstrate improvement in functional mobility/tasks. Baseline: 52 Goal status:  INITIAL  3.  Pt will increase left hip strength to at least 5-/5 to allow him to return to exercising in the gym and easier stair navigation. Baseline:  Goal status: INITIAL  4.  Patient will increase left single leg stance to greater than 30 sec with min/moderate balance compensations/strategies to decrease risk of falling. Baseline:  Goal status: INITIAL  5.  Patient will report ability to return to playing golf or going to the driving range without increased pain. Baseline:  Goal status: INITIAL    PLAN:  PT FREQUENCY: 2x/week  PT DURATION: 8 weeks  PLANNED INTERVENTIONS: Therapeutic exercises, Therapeutic activity, Neuromuscular re-education, Balance training, Gait training, Patient/Family education, Self Care, Joint mobilization, Stair training, Vestibular training, Canalith repositioning, Aquatic Therapy, Dry Needling, Electrical stimulation, Spinal manipulation, Spinal mobilization, Cryotherapy, Moist heat, scar mobilization, Taping, Vasopneumatic device, Traction, Ultrasound, Ionotophoresis 4mg /ml Dexamethasone, Manual therapy, and Re-evaluation.  PLAN FOR NEXT SESSION: Assess and progress HEP as indicated, strengthening, flexibility   Reather Laurence, PT, DPT 08/14/23, 11:08 AM  [facility] [address] [place], [place] [address] Phone # [phone] Fax [phone]

## 2023-08-16 ENCOUNTER — Ambulatory Visit: Payer: Medicare PPO | Admitting: Rehabilitative and Restorative Service Providers"

## 2023-08-16 ENCOUNTER — Encounter: Payer: Self-pay | Admitting: Rehabilitative and Restorative Service Providers"

## 2023-08-16 DIAGNOSIS — R2689 Other abnormalities of gait and mobility: Secondary | ICD-10-CM

## 2023-08-16 DIAGNOSIS — M5459 Other low back pain: Secondary | ICD-10-CM

## 2023-08-16 DIAGNOSIS — R252 Cramp and spasm: Secondary | ICD-10-CM

## 2023-08-16 DIAGNOSIS — R262 Difficulty in walking, not elsewhere classified: Secondary | ICD-10-CM | POA: Diagnosis not present

## 2023-08-16 DIAGNOSIS — M6281 Muscle weakness (generalized): Secondary | ICD-10-CM | POA: Diagnosis not present

## 2023-08-16 DIAGNOSIS — R293 Abnormal posture: Secondary | ICD-10-CM | POA: Diagnosis not present

## 2023-08-16 NOTE — Therapy (Signed)
OUTPATIENT PHYSICAL THERAPY TREATMENT NOTE   Patient Name: Todd Kane MRN: [id number] DOB:[birth year], 67 y.o., male Today's Date: 08/16/2023  END OF SESSION:  PT End of Session - 08/16/23 1025     Visit Number 6    Date for PT Re-Evaluation 09/06/23    Authorization Type [employer]    Authorization Time Period 07/16/2023 - 09/06/2023    Authorization - Visit Number 6    Authorization - Number of Visits 16    Progress Note Due on Visit 10    PT Start Time 1015    PT Stop Time 1055    PT Time Calculation (min) 40 min    Activity Tolerance Patient tolerated treatment well    Behavior During Therapy [facility] for tasks assessed/performed             Past Medical History:  Diagnosis Date   Back pain    BPH (benign prostatic hyperplasia)    CAD (coronary artery disease)    a. mild-mod by cardiac CT 03/2017.   Chest pain    Hyperlipidemia    Hypertension    OSA (obstructive sleep apnea) 2013   Pre-diabetes    Spondylolisthesis    Past Surgical History:  Procedure Laterality Date   LAMINECTOMY WITH POSTERIOR LATERAL ARTHRODESIS LEVEL 1 N/A 05/06/2023   Procedure: POSTEROLATERAL FUSION LUMBAR THREE-LUMBAR FOUR;  Surgeon: Arman Bogus, MD;  Location: [facility] OR;  Service: Neurosurgery;  Laterality: N/A;   MOUTH SURGERY  1982   3rd molars   TONSILLECTOMY AND ADENOIDECTOMY  1959   Patient Active Problem List   Diagnosis Date Noted   S/P lumbar fusion 05/06/2023   Preoperative cardiovascular examination 04/10/2023   Pre-diabetes 08/03/2020   Coronary artery calcification seen on CAT scan 01/14/2018   Family history of early CAD 01/14/2018   Chest pain 01/29/2017   Chest pain of uncertain etiology 12/23/2014   Dyspnea on exertion 12/23/2014   Sciatica of right side 12/23/2014   Essential hypertension 12/23/2014   Hypercholesterolemia 12/23/2014    PCP: Farris Has, MD  REFERRING PROVIDER: Sherryl Manges, NP  REFERRING DIAG: M43.16  (ICD-10-CM) - Spondylolisthesis, lumbar region  Rationale for Evaluation and Treatment: Rehabilitation  THERAPY DIAG:  Other low back pain  Muscle weakness (generalized)  Cramp and spasm  Difficulty in walking, not elsewhere classified  Other abnormalities of gait and mobility  ONSET DATE: S/p PLIF L4-5 and posterior lateral fusion L3-4 on 05/06/2023 by Dr Yetta Barre  SUBJECTIVE:  SUBJECTIVE STATEMENT: Pt states that he is overall feeling at least 20% better since starting PT.  PERTINENT HISTORY:  S/p PLIF L4-5 and posterior lateral fusion L3-4 on 05/06/2023 by Dr Yetta Barre, Spondlyolisthesis, BPH, CAD  PAIN:  Are you having pain? Yes: NPRS scale: 0-1/10 Pain location: low back Pain description: sore Aggravating factors: prolonged periods in certain positions or prolonged ambulation Relieving factors: rest  PRECAUTIONS: Back  RED FLAGS: None   WEIGHT BEARING RESTRICTIONS: No  FALLS:  Has patient fallen in last 6 months? Yes. Number of falls 1 fall when he tripped on something approximately a month after surgery.  LIVING ENVIRONMENT: Lives with: lives with their spouse Lives in: House/apartment Stairs:  multilevel home Has following equipment at home: Dan Humphreys - 2 wheeled and Grab bars  OCCUPATION: Retired Cabin crew for 30 years   PLOF: Independent and Leisure: golfing  PATIENT GOALS: To return to the gym and to playing golf.  NEXT MD VISIT: "a couple of months" for follow up with Dr Yetta Barre  OBJECTIVE:   DIAGNOSTIC FINDINGS:  Lumbar Radiograph on 05/06/2023: IMPRESSION: Fluoroscopic assistance was provided for surgical fusion from L3-L5 levels.  PATIENT SURVEYS:  Eval:  FOTO 52 (projected 63 by visit 11)   COGNITION: Overall cognitive status: Within functional limits for tasks  assessed     SENSATION: Pt reports some numbness and tingling in his feet, primarily left LE.  States that it is improved from what it is from prior to surgery.  MUSCLE LENGTH: Hamstrings: tightness bilat  POSTURE: forward head  PALPATION: Mild tenderness to palpation along lumbar spine  LUMBAR ROM:   Eval: Limited at least 25% with increased pain.  Limited by previous precautions from back surgery.  LOWER EXTREMITY ROM:     Eval:  WFL  LOWER EXTREMITY MMT:    Eval:  RLE strength is WFL, left hip strength is grossly 4 to 4+/5 throughout  FUNCTIONAL TESTS:  Eval: 5 times sit to stand: 11.74 sec Timed up and go (TUG): 8.99 sec Single Leg Stance:  right- >30 sec with min to mod balance corrections/strategies.  Left- >30 sec with excessive balance strategies/corrections)  GAIT: Distance walked: >500 ft Assistive device utilized: None Level of assistance: Complete Independence Comments: Pt reports that he usually stops after 15-20 minutes before he stops and reports discomfort with ambulation.  TODAY'S TREATMENT:                                                                                                                               DATE: 08/14/2023 Nustep level 5 x6 min with PT present to discuss status Standing rocker board for DF/PF x2 min Standing rocker board for lateral weight shift x2 min Standing rocker board to maintain balance x1 min Single leg stance on flat side of half foam bolster 2x30 sec Seated rows 55# 2x10 Seated lat pull-down 55# 2x10 Leg Press (seat at 8) 140# x15, 150# x15 Hip Matrix 45#:  hip  abduction and hip extension.  2x10 each bilat Seated blue pball rollout 3x10 sec   DATE: 08/14/2023 Nustep level 5 x6 min with PT present to discuss status Standing hamstring stretch at stairs 2x20 sec bilat Seated lat pull-down 55# 2x10 Standing diagonal chops with 10# cable pulley 2x10 bilat Seated rows 55# 2x10 Leg Press (seat at 8) 140# 2x15 Hip  Matrix 45#:  hip abduction and hip extension.  2x10 each bilat Seated piriformis stretch 2x20 sec bilat Seated blue pball rollout 5x10 sec Quadruped alt UE/LE extension (bird dog) 2x10   DATE: 08/08/2023 Nustep level 5 x6 min with PT present to discuss status Standing hamstring stretch at stairs 2x20 sec bilat Standing rocker board for DF/PF x2 min Leg Press (seat at 8) 120# x15, 130# x15 Hip Matrix 40#:  hip abduction and hip extension.  2x10 each bilat Hip Matrix 55# hip flexion 2x10 each bilat Standing diagonal chops with 10# cable pulley 2x10 bilat Seated lat pull-down 55# 2x10 Seated rows 55# 2x10 Seated piriformis stretch 2x20 sec bilat      PATIENT EDUCATION:  Education details: Issued HEP Person educated: Patient Education method: Explanation, Facilities manager, and Handouts Education comprehension: verbalized understanding and returned demonstration  HOME EXERCISE PROGRAM: Access Code: [email or web address] URL: [email or web address] Date: 08/01/2023 Prepared by: Clydie Braun Christoper Bushey  Exercises - Seated Hamstring Stretch  - 1 x daily - 7 x weekly - 2 reps - 20 sec hold - Seated Piriformis Stretch with Trunk Bend  - 1 x daily - 7 x weekly - 2 reps - 20 sec hold - Supine Bridge  - 1 x daily - 7 x weekly - 2 sets - 10 reps - Supine 90/90 Alternating Heel Touches with Posterior Pelvic Tilt  - 1 x daily - 7 x weekly - 2 sets - 10 reps - Supine Double Knee to Chest  - 1 x daily - 7 x weekly - 2 reps - 20 sec hold - Clamshell with Resistance  - 1 x daily - 7 x weekly - 2 sets - 10 reps - Bird Dog  - 1 x daily - 7 x weekly - 1-2 sets - 10 reps - Quadruped Fire Hydrant  - 1 x daily - 7 x weekly - 1-2 sets - 10 reps - Quadruped Transversus Abdominis Bracing  - 1 x daily - 7 x weekly - 2 sets - 10 reps - Child's Pose Stretch  - 1 x daily - 7 x weekly - 2 reps - 20 sec hold - Side Stepping with Resistance at Ankles  - 1 x daily - 7 x weekly - 2 sets - 10 reps  ASSESSMENT:  CLINICAL  IMPRESSION: Mr Corkery presents to skilled PT without complaints of pain, stating that he just went to see his personal trainer this morning.  Patient able to progress through session for balance and core stability.  Patient continues to progress with hip and LE strengthening with weight machines.  Patient able to tolerate single leg stance with only occasional support of UE for improved balance.  Patient continues to require skilled PT to progress towards goal related activities.   OBJECTIVE IMPAIRMENTS: decreased balance, difficulty walking, decreased strength, increased muscle spasms, impaired flexibility, postural dysfunction, and pain.   ACTIVITY LIMITATIONS: lifting, bending, squatting, and stairs  PARTICIPATION LIMITATIONS: community activity and yard work  PERSONAL FACTORS: Past/current experiences, Time since onset of injury/illness/exacerbation, and 3+ comorbidities: lumbar spondylolisthesis, s/p lumbar fusion, CAD  are also affecting patient's functional outcome.   REHAB POTENTIAL: Good  CLINICAL DECISION MAKING: Evolving/moderate complexity  EVALUATION COMPLEXITY: Moderate   GOALS: Goals reviewed with patient? Yes  SHORT TERM GOALS: Target date: 08/02/2023  Patient will be independent with initial HEP. Baseline: Goal status: MET  2.  Pt will report at least a 30% improvement in symptoms since initial evaluation. Baseline:  Goal status: Ongoing  3.  Pt to report ability to go chip some golf balls in a putting green or simulation of that activity without increased pain. Baseline:  Goal status: Met    LONG TERM GOALS: Target date: 09/06/2023  Pt will be independent with advanced HEP. Baseline:  Goal status: Ongoing  2.  Patient will increase FOTO score to 63 to demonstrate improvement in functional mobility/tasks. Baseline: 52 Goal status: INITIAL  3.  Pt will increase left hip strength to at least 5-/5 to allow him to return to exercising in the gym and  easier stair navigation. Baseline:  Goal status: INITIAL  4.  Patient will increase left single leg stance to greater than 30 sec with min/moderate balance compensations/strategies to decrease risk of falling. Baseline:  Goal status: INITIAL  5.  Patient will report ability to return to playing golf or going to the driving range without increased pain. Baseline:  Goal status: INITIAL    PLAN:  PT FREQUENCY: 2x/week  PT DURATION: 8 weeks  PLANNED INTERVENTIONS: Therapeutic exercises, Therapeutic activity, Neuromuscular re-education, Balance training, Gait training, Patient/Family education, Self Care, Joint mobilization, Stair training, Vestibular training, Canalith repositioning, Aquatic Therapy, Dry Needling, Electrical stimulation, Spinal manipulation, Spinal mobilization, Cryotherapy, Moist heat, scar mobilization, Taping, Vasopneumatic device, Traction, Ultrasound, Ionotophoresis 4mg /ml Dexamethasone, Manual therapy, and Re-evaluation.  PLAN FOR NEXT SESSION: Assess and progress HEP as indicated, strengthening, flexibility   Reather Laurence, PT, DPT 08/16/23, 11:12 AM  [facility] [address], [address] [place], [place] [address] Phone # [phone] Fax [phone]

## 2023-08-20 ENCOUNTER — Encounter: Payer: Medicare PPO | Admitting: Rehabilitative and Restorative Service Providers"

## 2023-08-20 DIAGNOSIS — M4316 Spondylolisthesis, lumbar region: Secondary | ICD-10-CM | POA: Diagnosis not present

## 2023-08-20 DIAGNOSIS — Z6831 Body mass index (BMI) 31.0-31.9, adult: Secondary | ICD-10-CM | POA: Diagnosis not present

## 2023-08-21 ENCOUNTER — Ambulatory Visit: Payer: Medicare PPO

## 2023-08-21 DIAGNOSIS — M6281 Muscle weakness (generalized): Secondary | ICD-10-CM | POA: Diagnosis not present

## 2023-08-21 DIAGNOSIS — R262 Difficulty in walking, not elsewhere classified: Secondary | ICD-10-CM | POA: Diagnosis not present

## 2023-08-21 DIAGNOSIS — R252 Cramp and spasm: Secondary | ICD-10-CM

## 2023-08-21 DIAGNOSIS — R293 Abnormal posture: Secondary | ICD-10-CM | POA: Diagnosis not present

## 2023-08-21 DIAGNOSIS — M5459 Other low back pain: Secondary | ICD-10-CM

## 2023-08-21 DIAGNOSIS — R2689 Other abnormalities of gait and mobility: Secondary | ICD-10-CM | POA: Diagnosis not present

## 2023-08-21 NOTE — Therapy (Signed)
OUTPATIENT PHYSICAL THERAPY TREATMENT NOTE   Patient Name: Todd Kane MRN: [id number] DOB:1955/12/24, 67 y.o., male Today's Date: 08/21/2023  END OF SESSION:  PT End of Session - 08/21/23 0938     Visit Number 7    Date for PT Re-Evaluation 09/06/23    Authorization Type [employer]    Authorization Time Period 07/16/2023 - 09/06/2023    Authorization - Visit Number 7    Authorization - Number of Visits 16    Progress Note Due on Visit 10    PT Start Time 0933    PT Stop Time 1014    PT Time Calculation (min) 41 min    Activity Tolerance Patient tolerated treatment well    Behavior During Therapy [facility] for tasks assessed/performed             Past Medical History:  Diagnosis Date   Back pain    BPH (benign prostatic hyperplasia)    CAD (coronary artery disease)    a. mild-mod by cardiac CT 03/2017.   Chest pain    Hyperlipidemia    Hypertension    OSA (obstructive sleep apnea) 2013   Pre-diabetes    Spondylolisthesis    Past Surgical History:  Procedure Laterality Date   LAMINECTOMY WITH POSTERIOR LATERAL ARTHRODESIS LEVEL 1 N/A 05/06/2023   Procedure: POSTEROLATERAL FUSION LUMBAR THREE-LUMBAR FOUR;  Surgeon: Arman Bogus, MD;  Location: [facility] OR;  Service: Neurosurgery;  Laterality: N/A;   MOUTH SURGERY  1982   3rd molars   TONSILLECTOMY AND ADENOIDECTOMY  1959   Patient Active Problem List   Diagnosis Date Noted   S/P lumbar fusion 05/06/2023   Preoperative cardiovascular examination 04/10/2023   Pre-diabetes 08/03/2020   Coronary artery calcification seen on CAT scan 01/14/2018   Family history of early CAD 01/14/2018   Chest pain 01/29/2017   Chest pain of uncertain etiology 12/23/2014   Dyspnea on exertion 12/23/2014   Sciatica of right side 12/23/2014   Essential hypertension 12/23/2014   Hypercholesterolemia 12/23/2014    PCP: Farris Has, MD  REFERRING PROVIDER: Sherryl Manges, NP  REFERRING DIAG: M43.16  (ICD-10-CM) - Spondylolisthesis, lumbar region  Rationale for Evaluation and Treatment: Rehabilitation  THERAPY DIAG:  Other low back pain  Muscle weakness (generalized)  Cramp and spasm  Difficulty in walking, not elsewhere classified  ONSET DATE: S/p PLIF L4-5 and posterior lateral fusion L3-4 on 05/06/2023 by Dr Yetta Barre  SUBJECTIVE:  SUBJECTIVE STATEMENT: Pt states that he is doing ok.  "My legs are just weak"  PERTINENT HISTORY:  S/p PLIF L4-5 and posterior lateral fusion L3-4 on 05/06/2023 by Dr Yetta Barre, Spondlyolisthesis, BPH, CAD  PAIN:  Are you having pain? Yes: NPRS scale: 0-1/10 Pain location: low back Pain description: sore Aggravating factors: prolonged periods in certain positions or prolonged ambulation Relieving factors: rest  PRECAUTIONS: Back  RED FLAGS: None   WEIGHT BEARING RESTRICTIONS: No  FALLS:  Has patient fallen in last 6 months? Yes. Number of falls 1 fall when he tripped on something approximately a month after surgery.  LIVING ENVIRONMENT: Lives with: lives with their spouse Lives in: House/apartment Stairs:  multilevel home Has following equipment at home: Dan Humphreys - 2 wheeled and Grab bars  OCCUPATION: Retired Cabin crew for 30 years   PLOF: Independent and Leisure: golfing  PATIENT GOALS: To return to the gym and to playing golf.  NEXT MD VISIT: "a couple of months" for follow up with Dr Yetta Barre  OBJECTIVE:   DIAGNOSTIC FINDINGS:  Lumbar Radiograph on 05/06/2023: IMPRESSION: Fluoroscopic assistance was provided for surgical fusion from L3-L5 levels.  PATIENT SURVEYS:  Eval:  FOTO 52 (projected 63 by visit 11)   COGNITION: Overall cognitive status: Within functional limits for tasks assessed     SENSATION: Pt reports some numbness and tingling in his feet,  primarily left LE.  States that it is improved from what it is from prior to surgery.  MUSCLE LENGTH: Hamstrings: tightness bilat  POSTURE: forward head  PALPATION: Mild tenderness to palpation along lumbar spine  LUMBAR ROM:   Eval: Limited at least 25% with increased pain.  Limited by previous precautions from back surgery.  LOWER EXTREMITY ROM:     Eval:  WFL  LOWER EXTREMITY MMT:    Eval:  RLE strength is WFL, left hip strength is grossly 4 to 4+/5 throughout  FUNCTIONAL TESTS:  Eval: 5 times sit to stand: 11.74 sec Timed up and go (TUG): 8.99 sec Single Leg Stance:  right- >30 sec with min to mod balance corrections/strategies.  Left- >30 sec with excessive balance strategies/corrections)  GAIT: Distance walked: >500 ft Assistive device utilized: None Level of assistance: Complete Independence Comments: Pt reports that he usually stops after 15-20 minutes before he stops and reports discomfort with ambulation.  TODAY'S TREATMENT:                                                                                                                              DATE: 08/21/2023 Recumbent bike level 5 x 5 min with PT present to discuss status Standing rocker board for DF/PF x2 min Standing rocker board for lateral weight shift x2 min Hip Matrix 45#:  hip abduction and hip extension.  2x10 each bilat Standing rocker board to maintain balance x1 min Leg Press (seat at 8) 155# 2 x 10, 160# x 15 Single leg stance on flat side of half foam bolster  2x30 sec each LE Seated rows 55# 2x10 Seated lat pull-down 55# 2x10 BOSU squats on flat side of BOSU (mini squats) Seated blue pball rollout 3x10 sec  DATE: 08/16/2023 Nustep level 5 x6 min with PT present to discuss status Standing rocker board for DF/PF x2 min Standing rocker board for lateral weight shift x2 min Standing rocker board to maintain balance x1 min Single leg stance on flat side of half foam bolster 2x30 sec Seated  rows 55# 2x10 Seated lat pull-down 55# 2x10 Leg Press (seat at 8) 140# x15, 150# x15 Hip Matrix 45#:  hip abduction and hip extension.  2x10 each bilat Seated blue pball rollout 3x10 sec   DATE: 08/14/2023 Nustep level 5 x6 min with PT present to discuss status Standing hamstring stretch at stairs 2x20 sec bilat Seated lat pull-down 55# 2x10 Standing diagonal chops with 10# cable pulley 2x10 bilat Seated rows 55# 2x10 Leg Press (seat at 8) 140# 2x15 Hip Matrix 45#:  hip abduction and hip extension.  2x10 each bilat Seated piriformis stretch 2x20 sec bilat Seated blue pball rollout 5x10 sec Quadruped alt UE/LE extension (bird dog) 2x10   PATIENT EDUCATION:  Education details: Issued HEP Person educated: Patient Education method: Explanation, Facilities manager, and Handouts Education comprehension: verbalized understanding and returned demonstration  HOME EXERCISE PROGRAM: Access Code: [email or web address] URL: [email or web address] Date: 08/01/2023 Prepared by: Clydie Braun Menke  Exercises - Seated Hamstring Stretch  - 1 x daily - 7 x weekly - 2 reps - 20 sec hold - Seated Piriformis Stretch with Trunk Bend  - 1 x daily - 7 x weekly - 2 reps - 20 sec hold - Supine Bridge  - 1 x daily - 7 x weekly - 2 sets - 10 reps - Supine 90/90 Alternating Heel Touches with Posterior Pelvic Tilt  - 1 x daily - 7 x weekly - 2 sets - 10 reps - Supine Double Knee to Chest  - 1 x daily - 7 x weekly - 2 reps - 20 sec hold - Clamshell with Resistance  - 1 x daily - 7 x weekly - 2 sets - 10 reps - Bird Dog  - 1 x daily - 7 x weekly - 1-2 sets - 10 reps - Quadruped Fire Hydrant  - 1 x daily - 7 x weekly - 1-2 sets - 10 reps - Quadruped Transversus Abdominis Bracing  - 1 x daily - 7 x weekly - 2 sets - 10 reps - Child's Pose Stretch  - 1 x daily - 7 x weekly - 2 reps - 20 sec hold - Side Stepping with Resistance at Ankles  - 1 x daily - 7 x weekly - 2 sets - 10 reps  ASSESSMENT:  CLINICAL  IMPRESSION: Mr Baena is progressing appropriately.  He desires to be challenged and does well with increasing resistance and higher level balance activities.  He struggles with SL balance on half roll and BOSU squats today but responded well to verbal cues to engage core and hip stabilizers.  He is well motivated and compliant.  He should continue to do well.    Patient continues to require skilled PT to progress towards goal related activities.   OBJECTIVE IMPAIRMENTS: decreased balance, difficulty walking, decreased strength, increased muscle spasms, impaired flexibility, postural dysfunction, and pain.   ACTIVITY LIMITATIONS: lifting, bending, squatting, and stairs  PARTICIPATION LIMITATIONS: community activity and yard work  PERSONAL FACTORS: Past/current experiences, Time since onset of injury/illness/exacerbation, and 3+ comorbidities: lumbar spondylolisthesis, s/p lumbar fusion,  CAD  are also affecting patient's functional outcome.   REHAB POTENTIAL: Good  CLINICAL DECISION MAKING: Evolving/moderate complexity  EVALUATION COMPLEXITY: Moderate   GOALS: Goals reviewed with patient? Yes  SHORT TERM GOALS: Target date: 08/02/2023  Patient will be independent with initial HEP. Baseline: Goal status: MET  2.  Pt will report at least a 30% improvement in symptoms since initial evaluation. Baseline:  Goal status: Ongoing  3.  Pt to report ability to go chip some golf balls in a putting green or simulation of that activity without increased pain. Baseline:  Goal status: Met    LONG TERM GOALS: Target date: 09/06/2023  Pt will be independent with advanced HEP. Baseline:  Goal status: Ongoing  2.  Patient will increase FOTO score to 63 to demonstrate improvement in functional mobility/tasks. Baseline: 52 Goal status: INITIAL  3.  Pt will increase left hip strength to at least 5-/5 to allow him to return to exercising in the gym and easier stair navigation. Baseline:   Goal status: INITIAL  4.  Patient will increase left single leg stance to greater than 30 sec with min/moderate balance compensations/strategies to decrease risk of falling. Baseline:  Goal status: INITIAL  5.  Patient will report ability to return to playing golf or going to the driving range without increased pain. Baseline:  Goal status: INITIAL    PLAN:  PT FREQUENCY: 2x/week  PT DURATION: 8 weeks  PLANNED INTERVENTIONS: Therapeutic exercises, Therapeutic activity, Neuromuscular re-education, Balance training, Gait training, Patient/Family education, Self Care, Joint mobilization, Stair training, Vestibular training, Canalith repositioning, Aquatic Therapy, Dry Needling, Electrical stimulation, Spinal manipulation, Spinal mobilization, Cryotherapy, Moist heat, scar mobilization, Taping, Vasopneumatic device, Traction, Ultrasound, Ionotophoresis 4mg /ml Dexamethasone, Manual therapy, and Re-evaluation.  PLAN FOR NEXT SESSION: Focus on hip stability and core strength along with dynamic balance activities, strengthening, flexibility   Annamary Buschman B. Anays Detore, PT 08/21/23 10:15 AM [facility] [address] [place], [place] [address] Phone # [phone] Fax [phone]

## 2023-08-22 ENCOUNTER — Ambulatory Visit: Payer: Medicare PPO

## 2023-08-22 DIAGNOSIS — R252 Cramp and spasm: Secondary | ICD-10-CM

## 2023-08-22 DIAGNOSIS — M6281 Muscle weakness (generalized): Secondary | ICD-10-CM | POA: Diagnosis not present

## 2023-08-22 DIAGNOSIS — R293 Abnormal posture: Secondary | ICD-10-CM | POA: Diagnosis not present

## 2023-08-22 DIAGNOSIS — R262 Difficulty in walking, not elsewhere classified: Secondary | ICD-10-CM

## 2023-08-22 DIAGNOSIS — M5459 Other low back pain: Secondary | ICD-10-CM

## 2023-08-22 DIAGNOSIS — R2689 Other abnormalities of gait and mobility: Secondary | ICD-10-CM | POA: Diagnosis not present

## 2023-08-22 NOTE — Therapy (Signed)
OUTPATIENT PHYSICAL THERAPY TREATMENT NOTE   Patient Name: Todd Kane MRN: [id number] DOB:03-Feb-1956, 67 y.o., male Today's Date: 08/22/2023  END OF SESSION:  PT End of Session - 08/22/23 0856     Visit Number 8    Date for PT Re-Evaluation 09/06/23    Authorization Type [employer]    Authorization Time Period 07/16/2023 - 09/06/2023    Authorization - Visit Number 8    Authorization - Number of Visits 16    Progress Note Due on Visit 10    PT Start Time 0845    PT Stop Time 0930    PT Time Calculation (min) 45 min    Activity Tolerance Patient tolerated treatment well    Behavior During Therapy [facility] for tasks assessed/performed             Past Medical History:  Diagnosis Date   Back pain    BPH (benign prostatic hyperplasia)    CAD (coronary artery disease)    a. mild-mod by cardiac CT 03/2017.   Chest pain    Hyperlipidemia    Hypertension    OSA (obstructive sleep apnea) 2013   Pre-diabetes    Spondylolisthesis    Past Surgical History:  Procedure Laterality Date   LAMINECTOMY WITH POSTERIOR LATERAL ARTHRODESIS LEVEL 1 N/A 05/06/2023   Procedure: POSTEROLATERAL FUSION LUMBAR THREE-LUMBAR FOUR;  Surgeon: Arman Bogus, MD;  Location: [facility] OR;  Service: Neurosurgery;  Laterality: N/A;   MOUTH SURGERY  1982   3rd molars   TONSILLECTOMY AND ADENOIDECTOMY  1959   Patient Active Problem List   Diagnosis Date Noted   S/P lumbar fusion 05/06/2023   Preoperative cardiovascular examination 04/10/2023   Pre-diabetes 08/03/2020   Coronary artery calcification seen on CAT scan 01/14/2018   Family history of early CAD 01/14/2018   Chest pain 01/29/2017   Chest pain of uncertain etiology 12/23/2014   Dyspnea on exertion 12/23/2014   Sciatica of right side 12/23/2014   Essential hypertension 12/23/2014   Hypercholesterolemia 12/23/2014    PCP: Farris Has, MD  REFERRING PROVIDER: Sherryl Manges, NP  REFERRING DIAG: M43.16  (ICD-10-CM) - Spondylolisthesis, lumbar region  Rationale for Evaluation and Treatment: Rehabilitation  THERAPY DIAG:  Other low back pain  Muscle weakness (generalized)  Difficulty in walking, not elsewhere classified  Cramp and spasm  ONSET DATE: S/p PLIF L4-5 and posterior lateral fusion L3-4 on 05/06/2023 by Dr Yetta Barre  SUBJECTIVE:  SUBJECTIVE STATEMENT: Pt reports no changes from yesterday.   PERTINENT HISTORY:  S/p PLIF L4-5 and posterior lateral fusion L3-4 on 05/06/2023 by Dr Yetta Barre, Spondlyolisthesis, BPH, CAD  PAIN:  Are you having pain? Yes: NPRS scale: 0-1/10 Pain location: low back Pain description: sore Aggravating factors: prolonged periods in certain positions or prolonged ambulation Relieving factors: rest  PRECAUTIONS: Back  RED FLAGS: None   WEIGHT BEARING RESTRICTIONS: No  FALLS:  Has patient fallen in last 6 months? Yes. Number of falls 1 fall when he tripped on something approximately a month after surgery.  LIVING ENVIRONMENT: Lives with: lives with their spouse Lives in: House/apartment Stairs:  multilevel home Has following equipment at home: Dan Humphreys - 2 wheeled and Grab bars  OCCUPATION: Retired Cabin crew for 30 years   PLOF: Independent and Leisure: golfing  PATIENT GOALS: To return to the gym and to playing golf.  NEXT MD VISIT: "a couple of months" for follow up with Dr Yetta Barre  OBJECTIVE:   DIAGNOSTIC FINDINGS:  Lumbar Radiograph on 05/06/2023: IMPRESSION: Fluoroscopic assistance was provided for surgical fusion from L3-L5 levels.  PATIENT SURVEYS:  Eval:  FOTO 52 (projected 63 by visit 11)   COGNITION: Overall cognitive status: Within functional limits for tasks assessed     SENSATION: Pt reports some numbness and tingling in his feet, primarily left  LE.  States that it is improved from what it is from prior to surgery.  MUSCLE LENGTH: Hamstrings: tightness bilat  POSTURE: forward head  PALPATION: Mild tenderness to palpation along lumbar spine  LUMBAR ROM:   Eval: Limited at least 25% with increased pain.  Limited by previous precautions from back surgery.  LOWER EXTREMITY ROM:     Eval:  WFL  LOWER EXTREMITY MMT:    Eval:  RLE strength is WFL, left hip strength is grossly 4 to 4+/5 throughout  FUNCTIONAL TESTS:  Eval: 5 times sit to stand: 11.74 sec Timed up and go (TUG): 8.99 sec Single Leg Stance:  right- >30 sec with min to mod balance corrections/strategies.  Left- >30 sec with excessive balance strategies/corrections)  GAIT: Distance walked: >500 ft Assistive device utilized: None Level of assistance: Complete Independence Comments: Pt reports that he usually stops after 15-20 minutes before he stops and reports discomfort with ambulation.  TODAY'S TREATMENT:                                                                                                                              DATE: 08/21/2023 Recumbent bike level 5 x 6 min with PT present to discuss status Standing rocker board for DF/PF x2 min Standing rocker board for lateral weight shift x2 min Standing rocker board to maintain balance x1 min BOSU squats on flat side of BOSU (mini squats) BOSU lunges fwd and lateral x 10 each LE in each direction Lateral band walks with yellow loop 3 laps of 15 feet Leg Press (seat at 8) 160# x15, 165# x  15 Seated rows 60# 2x10 Seated lat pull-down 60# 2x10 Hip Matrix 45#:  hip abduction and hip extension.  2x10 each bilat   DATE: 08/16/2023 Nustep level 5 x6 min with PT present to discuss status Standing rocker board for DF/PF x2 min Standing rocker board for lateral weight shift x2 min Standing rocker board to maintain balance x1 min Single leg stance on flat side of half foam bolster 2x30 sec Seated rows  55# 2x10 Seated lat pull-down 55# 2x10 Leg Press (seat at 8) 140# x15, 150# x15 Hip Matrix 45#:  hip abduction and hip extension.  2x10 each bilat Seated blue pball rollout 3x10 sec   DATE: 08/14/2023 Nustep level 5 x6 min with PT present to discuss status Standing hamstring stretch at stairs 2x20 sec bilat Seated lat pull-down 55# 2x10 Standing diagonal chops with 10# cable pulley 2x10 bilat Seated rows 55# 2x10 Leg Press (seat at 8) 140# 2x15 Hip Matrix 45#:  hip abduction and hip extension.  2x10 each bilat Seated piriformis stretch 2x20 sec bilat Seated blue pball rollout 5x10 sec Quadruped alt UE/LE extension (bird dog) 2x10   PATIENT EDUCATION:  Education details: Issued HEP Person educated: Patient Education method: Explanation, Facilities manager, and Handouts Education comprehension: verbalized understanding and returned demonstration  HOME EXERCISE PROGRAM: Access Code: [email or web address] URL: [email or web address] Date: 08/01/2023 Prepared by: Clydie Braun Menke  Exercises - Seated Hamstring Stretch  - 1 x daily - 7 x weekly - 2 reps - 20 sec hold - Seated Piriformis Stretch with Trunk Bend  - 1 x daily - 7 x weekly - 2 reps - 20 sec hold - Supine Bridge  - 1 x daily - 7 x weekly - 2 sets - 10 reps - Supine 90/90 Alternating Heel Touches with Posterior Pelvic Tilt  - 1 x daily - 7 x weekly - 2 sets - 10 reps - Supine Double Knee to Chest  - 1 x daily - 7 x weekly - 2 reps - 20 sec hold - Clamshell with Resistance  - 1 x daily - 7 x weekly - 2 sets - 10 reps - Bird Dog  - 1 x daily - 7 x weekly - 1-2 sets - 10 reps - Quadruped Fire Hydrant  - 1 x daily - 7 x weekly - 1-2 sets - 10 reps - Quadruped Transversus Abdominis Bracing  - 1 x daily - 7 x weekly - 2 sets - 10 reps - Child's Pose Stretch  - 1 x daily - 7 x weekly - 2 reps - 20 sec hold - Side Stepping with Resistance at Ankles  - 1 x daily - 7 x weekly - 2 sets - 10 reps  ASSESSMENT:  CLINICAL IMPRESSION: Mr  Hermann is tolerating increasingly challenging activities for LE strength and balance.  His primary issue is hip and core weakness.  He would benefit from continuing skilled PT.    Patient continues to require skilled PT to progress towards goal related activities.   OBJECTIVE IMPAIRMENTS: decreased balance, difficulty walking, decreased strength, increased muscle spasms, impaired flexibility, postural dysfunction, and pain.   ACTIVITY LIMITATIONS: lifting, bending, squatting, and stairs  PARTICIPATION LIMITATIONS: community activity and yard work  PERSONAL FACTORS: Past/current experiences, Time since onset of injury/illness/exacerbation, and 3+ comorbidities: lumbar spondylolisthesis, s/p lumbar fusion, CAD  are also affecting patient's functional outcome.   REHAB POTENTIAL: Good  CLINICAL DECISION MAKING: Evolving/moderate complexity  EVALUATION COMPLEXITY: Moderate   GOALS: Goals reviewed with patient? Yes  SHORT TERM GOALS: Target  date: 08/02/2023  Patient will be independent with initial HEP. Baseline: Goal status: MET  2.  Pt will report at least a 30% improvement in symptoms since initial evaluation. Baseline:  Goal status: Ongoing  3.  Pt to report ability to go chip some golf balls in a putting green or simulation of that activity without increased pain. Baseline:  Goal status: Met    LONG TERM GOALS: Target date: 09/06/2023  Pt will be independent with advanced HEP. Baseline:  Goal status: Ongoing  2.  Patient will increase FOTO score to 63 to demonstrate improvement in functional mobility/tasks. Baseline: 52 Goal status: INITIAL  3.  Pt will increase left hip strength to at least 5-/5 to allow him to return to exercising in the gym and easier stair navigation. Baseline:  Goal status: INITIAL  4.  Patient will increase left single leg stance to greater than 30 sec with min/moderate balance compensations/strategies to decrease risk of falling. Baseline:   Goal status: INITIAL  5.  Patient will report ability to return to playing golf or going to the driving range without increased pain. Baseline:  Goal status: INITIAL    PLAN:  PT FREQUENCY: 2x/week  PT DURATION: 8 weeks  PLANNED INTERVENTIONS: Therapeutic exercises, Therapeutic activity, Neuromuscular re-education, Balance training, Gait training, Patient/Family education, Self Care, Joint mobilization, Stair training, Vestibular training, Canalith repositioning, Aquatic Therapy, Dry Needling, Electrical stimulation, Spinal manipulation, Spinal mobilization, Cryotherapy, Moist heat, scar mobilization, Taping, Vasopneumatic device, Traction, Ultrasound, Ionotophoresis 4mg /ml Dexamethasone, Manual therapy, and Re-evaluation.  PLAN FOR NEXT SESSION: Focus on hip stability and core strength along with dynamic balance activities, strengthening, flexibility   Tyheim Vanalstyne B. Marabelle Cushman, PT 08/22/23 11:34 AM [facility] [address] [place], [place] [address] Phone # [phone] Fax [phone]

## 2023-08-27 ENCOUNTER — Encounter: Payer: Self-pay | Admitting: Rehabilitative and Restorative Service Providers"

## 2023-08-27 ENCOUNTER — Ambulatory Visit: Payer: Medicare PPO | Admitting: Rehabilitative and Restorative Service Providers"

## 2023-08-27 DIAGNOSIS — R2689 Other abnormalities of gait and mobility: Secondary | ICD-10-CM | POA: Diagnosis not present

## 2023-08-27 DIAGNOSIS — R262 Difficulty in walking, not elsewhere classified: Secondary | ICD-10-CM | POA: Diagnosis not present

## 2023-08-27 DIAGNOSIS — R293 Abnormal posture: Secondary | ICD-10-CM | POA: Diagnosis not present

## 2023-08-27 DIAGNOSIS — M6281 Muscle weakness (generalized): Secondary | ICD-10-CM

## 2023-08-27 DIAGNOSIS — R252 Cramp and spasm: Secondary | ICD-10-CM

## 2023-08-27 DIAGNOSIS — M5459 Other low back pain: Secondary | ICD-10-CM

## 2023-08-27 NOTE — Therapy (Signed)
OUTPATIENT PHYSICAL THERAPY TREATMENT NOTE   Patient Name: Todd Kane MRN: [id number] DOB:[birth year], 67 y.o., male Today's Date: 08/27/2023  END OF SESSION:  PT End of Session - 08/27/23 0939     Visit Number 9    Date for PT Re-Evaluation 09/06/23    Authorization Type [employer]    Authorization Time Period 07/16/2023 - 09/06/2023    Authorization - Visit Number 9    Authorization - Number of Visits 16    Progress Note Due on Visit 10    PT Start Time 0930    PT Stop Time 1010    PT Time Calculation (min) 40 min    Activity Tolerance Patient tolerated treatment well    Behavior During Therapy [facility] for tasks assessed/performed             Past Medical History:  Diagnosis Date   Back pain    BPH (benign prostatic hyperplasia)    CAD (coronary artery disease)    a. mild-mod by cardiac CT 03/2017.   Chest pain    Hyperlipidemia    Hypertension    OSA (obstructive sleep apnea) 2013   Pre-diabetes    Spondylolisthesis    Past Surgical History:  Procedure Laterality Date   LAMINECTOMY WITH POSTERIOR LATERAL ARTHRODESIS LEVEL 1 N/A 05/06/2023   Procedure: POSTEROLATERAL FUSION LUMBAR THREE-LUMBAR FOUR;  Surgeon: Arman Bogus, MD;  Location: [facility] OR;  Service: Neurosurgery;  Laterality: N/A;   MOUTH SURGERY  1982   3rd molars   TONSILLECTOMY AND ADENOIDECTOMY  1959   Patient Active Problem List   Diagnosis Date Noted   S/P lumbar fusion 05/06/2023   Preoperative cardiovascular examination 04/10/2023   Pre-diabetes 08/03/2020   Coronary artery calcification seen on CAT scan 01/14/2018   Family history of early CAD 01/14/2018   Chest pain 01/29/2017   Chest pain of uncertain etiology 12/23/2014   Dyspnea on exertion 12/23/2014   Sciatica of right side 12/23/2014   Essential hypertension 12/23/2014   Hypercholesterolemia 12/23/2014    PCP: Farris Has, MD  REFERRING PROVIDER: Sherryl Manges, NP  REFERRING DIAG: M43.16  (ICD-10-CM) - Spondylolisthesis, lumbar region  Rationale for Evaluation and Treatment: Rehabilitation  THERAPY DIAG:  Other low back pain  Muscle weakness (generalized)  Difficulty in walking, not elsewhere classified  Cramp and spasm  Other abnormalities of gait and mobility  ONSET DATE: S/p PLIF L4-5 and posterior lateral fusion L3-4 on 05/06/2023 by Dr Yetta Barre  SUBJECTIVE:  SUBJECTIVE STATEMENT: Pt reports some soreness this morning, but overall, doing okay.  PERTINENT HISTORY:  S/p PLIF L4-5 and posterior lateral fusion L3-4 on 05/06/2023 by Dr Yetta Barre, Spondlyolisthesis, BPH, CAD  PAIN:  Are you having pain? Yes: NPRS scale: 1/10 Pain location: low back Pain description: sore Aggravating factors: prolonged periods in certain positions or prolonged ambulation Relieving factors: rest  PRECAUTIONS: Back  RED FLAGS: None   WEIGHT BEARING RESTRICTIONS: No  FALLS:  Has patient fallen in last 6 months? Yes. Number of falls 1 fall when he tripped on something approximately a month after surgery.  LIVING ENVIRONMENT: Lives with: lives with their spouse Lives in: House/apartment Stairs:  multilevel home Has following equipment at home: Dan Humphreys - 2 wheeled and Grab bars  OCCUPATION: Retired Cabin crew for 30 years   PLOF: Independent and Leisure: golfing  PATIENT GOALS: To return to the gym and to playing golf.  NEXT MD VISIT: "a couple of months" for follow up with Dr Yetta Barre  OBJECTIVE:   DIAGNOSTIC FINDINGS:  Lumbar Radiograph on 05/06/2023: IMPRESSION: Fluoroscopic assistance was provided for surgical fusion from L3-L5 levels.  PATIENT SURVEYS:  Eval:  FOTO 52 (projected 63 by visit 11)   COGNITION: Overall cognitive status: Within functional limits for tasks  assessed     SENSATION: Pt reports some numbness and tingling in his feet, primarily left LE.  States that it is improved from what it is from prior to surgery.  MUSCLE LENGTH: Hamstrings: tightness bilat  POSTURE: forward head  PALPATION: Mild tenderness to palpation along lumbar spine  LUMBAR ROM:   Eval: Limited at least 25% with increased pain.  Limited by previous precautions from back surgery.  LOWER EXTREMITY ROM:     Eval:  WFL  LOWER EXTREMITY MMT:    Eval:  RLE strength is WFL, left hip strength is grossly 4 to 4+/5 throughout  FUNCTIONAL TESTS:  Eval: 5 times sit to stand: 11.74 sec Timed up and go (TUG): 8.99 sec Single Leg Stance:  right- >30 sec with min to mod balance corrections/strategies.  Left- >30 sec with excessive balance strategies/corrections)  GAIT: Distance walked: >500 ft Assistive device utilized: None Level of assistance: Complete Independence Comments: Pt reports that he usually stops after 15-20 minutes before he stops and reports discomfort with ambulation.  TODAY'S TREATMENT:                                                                                                                              DATE:  08/27/2023 Nustep level 5 x6 min with PT present to discuss status Standing rocker board for DF/PF x2 min Standing rocker board for lateral weight shift x2 min Standing rocker board to maintain balance x1 min Seated lat pull-down 60# x10, 65# x10 BOSU squats on flat side of BOSU (mini squats) 2x10 BOSU lunges fwd and lateral x 10 each LE in each direction Seated rows 60# 2x10 Side stepping with yellow loop at  barre down and back length of barre x4 bilat Leg Press (seat at 8) 165# x15, 170# x 15 Hip Matrix 45#:  hip abduction and hip extension.  x10 each bilat   08/21/2023 Recumbent bike level 5 x 6 min with PT present to discuss status Standing rocker board for DF/PF x2 min Standing rocker board for lateral weight shift x2  min Standing rocker board to maintain balance x1 min BOSU squats on flat side of BOSU (mini squats) BOSU lunges fwd and lateral x 10 each LE in each direction Lateral band walks with yellow loop 3 laps of 15 feet Leg Press (seat at 8) 160# x15, 165# x 15 Seated rows 60# 2x10 Seated lat pull-down 60# 2x10 Hip Matrix 45#:  hip abduction and hip extension.  2x10 each bilat   DATE: 08/16/2023 Nustep level 5 x6 min with PT present to discuss status Standing rocker board for DF/PF x2 min Standing rocker board for lateral weight shift x2 min Standing rocker board to maintain balance x1 min Single leg stance on flat side of half foam bolster 2x30 sec Seated rows 55# 2x10 Seated lat pull-down 55# 2x10 Leg Press (seat at 8) 140# x15, 150# x15 Hip Matrix 45#:  hip abduction and hip extension.  2x10 each bilat Seated blue pball rollout 3x10 sec    PATIENT EDUCATION:  Education details: Issued HEP Person educated: Patient Education method: Explanation, Facilities manager, and Handouts Education comprehension: verbalized understanding and returned demonstration  HOME EXERCISE PROGRAM: Access Code: [email or web address] URL: [email or web address] Date: 08/01/2023 Prepared by: Clydie Braun Claramae Rigdon  Exercises - Seated Hamstring Stretch  - 1 x daily - 7 x weekly - 2 reps - 20 sec hold - Seated Piriformis Stretch with Trunk Bend  - 1 x daily - 7 x weekly - 2 reps - 20 sec hold - Supine Bridge  - 1 x daily - 7 x weekly - 2 sets - 10 reps - Supine 90/90 Alternating Heel Touches with Posterior Pelvic Tilt  - 1 x daily - 7 x weekly - 2 sets - 10 reps - Supine Double Knee to Chest  - 1 x daily - 7 x weekly - 2 reps - 20 sec hold - Clamshell with Resistance  - 1 x daily - 7 x weekly - 2 sets - 10 reps - Bird Dog  - 1 x daily - 7 x weekly - 1-2 sets - 10 reps - Quadruped Fire Hydrant  - 1 x daily - 7 x weekly - 1-2 sets - 10 reps - Quadruped Transversus Abdominis Bracing  - 1 x daily - 7 x weekly - 2 sets -  10 reps - Child's Pose Stretch  - 1 x daily - 7 x weekly - 2 reps - 20 sec hold - Side Stepping with Resistance at Ankles  - 1 x daily - 7 x weekly - 2 sets - 10 reps  ASSESSMENT:  CLINICAL IMPRESSION: Mr Gallihugh continues to tolerate increasing intensity of exercises.  Patient requires very minimal cuing for technique throughout, particularly with more challenging exercises.  Patient is approaching 10th visit and was educated to start considering if he wants to discharge from PT and only continue with personal training and HEP or if he would like to continue to progress functional strengthening and activities in PT.   OBJECTIVE IMPAIRMENTS: decreased balance, difficulty walking, decreased strength, increased muscle spasms, impaired flexibility, postural dysfunction, and pain.   ACTIVITY LIMITATIONS: lifting, bending, squatting, and stairs  PARTICIPATION LIMITATIONS: community activity and yard  work  PERSONAL FACTORS: Past/current experiences, Time since onset of injury/illness/exacerbation, and 3+ comorbidities: lumbar spondylolisthesis, s/p lumbar fusion, CAD  are also affecting patient's functional outcome.   REHAB POTENTIAL: Good  CLINICAL DECISION MAKING: Evolving/moderate complexity  EVALUATION COMPLEXITY: Moderate   GOALS: Goals reviewed with patient? Yes  SHORT TERM GOALS: Target date: 08/02/2023  Patient will be independent with initial HEP. Baseline: Goal status: MET  2.  Pt will report at least a 30% improvement in symptoms since initial evaluation. Baseline:  Goal status: Ongoing  3.  Pt to report ability to go chip some golf balls in a putting green or simulation of that activity without increased pain. Baseline:  Goal status: Met    LONG TERM GOALS: Target date: 09/06/2023  Pt will be independent with advanced HEP. Baseline:  Goal status: Ongoing  2.  Patient will increase FOTO score to 63 to demonstrate improvement in functional  mobility/tasks. Baseline: 52 Goal status: INITIAL  3.  Pt will increase left hip strength to at least 5-/5 to allow him to return to exercising in the gym and easier stair navigation. Baseline:  Goal status: INITIAL  4.  Patient will increase left single leg stance to greater than 30 sec with min/moderate balance compensations/strategies to decrease risk of falling. Baseline:  Goal status: INITIAL  5.  Patient will report ability to return to playing golf or going to the driving range without increased pain. Baseline:  Goal status: INITIAL    PLAN:  PT FREQUENCY: 2x/week  PT DURATION: 8 weeks  PLANNED INTERVENTIONS: Therapeutic exercises, Therapeutic activity, Neuromuscular re-education, Balance training, Gait training, Patient/Family education, Self Care, Joint mobilization, Stair training, Vestibular training, Canalith repositioning, Aquatic Therapy, Dry Needling, Electrical stimulation, Spinal manipulation, Spinal mobilization, Cryotherapy, Moist heat, scar mobilization, Taping, Vasopneumatic device, Traction, Ultrasound, Ionotophoresis 4mg /ml Dexamethasone, Manual therapy, and Re-evaluation.  PLAN FOR NEXT SESSION: Focus on hip stability and core strength along with dynamic balance activities, strengthening, flexibility   Reather Laurence, PT, DPT 08/27/23, 10:37 AM  [facility] [address] [place], [place] [address] Phone # [phone] Fax [phone]

## 2023-08-29 ENCOUNTER — Ambulatory Visit: Payer: Medicare PPO

## 2023-08-29 DIAGNOSIS — M5459 Other low back pain: Secondary | ICD-10-CM

## 2023-08-29 DIAGNOSIS — R262 Difficulty in walking, not elsewhere classified: Secondary | ICD-10-CM

## 2023-08-29 DIAGNOSIS — R252 Cramp and spasm: Secondary | ICD-10-CM | POA: Diagnosis not present

## 2023-08-29 DIAGNOSIS — R293 Abnormal posture: Secondary | ICD-10-CM

## 2023-08-29 DIAGNOSIS — R2689 Other abnormalities of gait and mobility: Secondary | ICD-10-CM | POA: Diagnosis not present

## 2023-08-29 DIAGNOSIS — M6281 Muscle weakness (generalized): Secondary | ICD-10-CM

## 2023-08-29 NOTE — Therapy (Signed)
OUTPATIENT PHYSICAL THERAPY TREATMENT NOTE Progress Note Reporting Period 07/16/23 to 08/29/23  See note below for Objective Data and Assessment of Progress/Goals.       Patient Name: Todd Kane MRN: [id number] DOB:08-31-1956, 67 y.o., male Today's Date: 08/29/2023  END OF SESSION:  PT End of Session - 08/29/23 1038     Visit Number 10    Date for PT Re-Evaluation 09/06/23    Authorization Type [employer]    Authorization Time Period 07/16/2023 - 09/06/2023    Authorization - Visit Number 10    Authorization - Number of Visits 16    Progress Note Due on Visit 20    PT Start Time 1015    PT Stop Time 1105    PT Time Calculation (min) 50 min    Activity Tolerance Patient tolerated treatment well    Behavior During Therapy [facility] for tasks assessed/performed             Past Medical History:  Diagnosis Date   Back pain    BPH (benign prostatic hyperplasia)    CAD (coronary artery disease)    a. mild-mod by cardiac CT 03/2017.   Chest pain    Hyperlipidemia    Hypertension    OSA (obstructive sleep apnea) 2013   Pre-diabetes    Spondylolisthesis    Past Surgical History:  Procedure Laterality Date   LAMINECTOMY WITH POSTERIOR LATERAL ARTHRODESIS LEVEL 1 N/A 05/06/2023   Procedure: POSTEROLATERAL FUSION LUMBAR THREE-LUMBAR FOUR;  Surgeon: Arman Bogus, MD;  Location: [facility] OR;  Service: Neurosurgery;  Laterality: N/A;   MOUTH SURGERY  1982   3rd molars   TONSILLECTOMY AND ADENOIDECTOMY  1959   Patient Active Problem List   Diagnosis Date Noted   S/P lumbar fusion 05/06/2023   Preoperative cardiovascular examination 04/10/2023   Pre-diabetes 08/03/2020   Coronary artery calcification seen on CAT scan 01/14/2018   Family history of early CAD 01/14/2018   Chest pain 01/29/2017   Chest pain of uncertain etiology 12/23/2014   Dyspnea on exertion 12/23/2014   Sciatica of right side 12/23/2014   Essential hypertension 12/23/2014    Hypercholesterolemia 12/23/2014    PCP: Farris Has, MD  REFERRING PROVIDER: Sherryl Manges, NP  REFERRING DIAG: M43.16 (ICD-10-CM) - Spondylolisthesis, lumbar region  Rationale for Evaluation and Treatment: Rehabilitation  THERAPY DIAG:  Other low back pain  Muscle weakness (generalized)  Difficulty in walking, not elsewhere classified  Cramp and spasm  Abnormal posture  ONSET DATE: S/p PLIF L4-5 and posterior lateral fusion L3-4 on 05/06/2023 by Dr Yetta Barre  SUBJECTIVE:  SUBJECTIVE STATEMENT: Pt reports no new issues this morning.  Pain rated at 0/10.   PERTINENT HISTORY:  S/p PLIF L4-5 and posterior lateral fusion L3-4 on 05/06/2023 by Dr Yetta Barre, Spondlyolisthesis, BPH, CAD  PAIN:  Are you having pain? Yes: NPRS scale: 0/10 Pain location: low back Pain description: sore Aggravating factors: prolonged periods in certain positions or prolonged ambulation Relieving factors: rest  PRECAUTIONS: Back  RED FLAGS: None   WEIGHT BEARING RESTRICTIONS: No  FALLS:  Has patient fallen in last 6 months? Yes. Number of falls 1 fall when he tripped on something approximately a month after surgery.  LIVING ENVIRONMENT: Lives with: lives with their spouse Lives in: House/apartment Stairs:  multilevel home Has following equipment at home: Dan Humphreys - 2 wheeled and Grab bars  OCCUPATION: Retired Cabin crew for 30 years   PLOF: Independent and Leisure: golfing  PATIENT GOALS: To return to the gym and to playing golf.  NEXT MD VISIT: "a couple of months" for follow up with Dr Yetta Barre  OBJECTIVE:   DIAGNOSTIC FINDINGS:  Lumbar Radiograph on 05/06/2023: IMPRESSION: Fluoroscopic assistance was provided for surgical fusion from L3-L5 levels.  PATIENT SURVEYS:  Eval:  FOTO 52 (projected 63 by  visit 11)  08/29/23:  FOTO 67 (projected 63 by visit 11) GOAL MET COGNITION: Overall cognitive status: Within functional limits for tasks assessed     SENSATION: Pt reports some numbness and tingling in his feet, primarily left LE.  States that it is improved from what it is from prior to surgery.  MUSCLE LENGTH: Hamstrings: tightness bilat  POSTURE: forward head  PALPATION: Mild tenderness to palpation along lumbar spine  LUMBAR ROM:   Eval: Limited at least 25% with increased pain.  Limited by previous precautions from back surgery.  LOWER EXTREMITY ROM:     Eval:  WFL  LOWER EXTREMITY MMT:    Eval:  RLE strength is WFL, left hip strength is grossly 4 to 4+/5 throughout   FUNCTIONAL TESTS:  Eval: 5 times sit to stand: 11.74 sec Timed up and go (TUG): 8.99 sec Single Leg Stance:  right- >30 sec with min to mod balance corrections/strategies.  Left- >30 sec with excessive balance strategies/corrections)   08/29/23 5 times sit to stand: 8.74 sec Timed up and go (TUG): 6.35 sec SLS: both scored high first visit (>30 sec bilaterally) GAIT: Distance walked: >500 ft Assistive device utilized: None Level of assistance: Complete Independence Comments: Pt reports that he usually stops after 15-20 minutes before he stops and reports discomfort with ambulation.  TODAY'S TREATMENT:                                                                                                                              DATE:  08/29/2023 Recumbent bike level 1 x 11 min Side stepping with yellow loop at barre down and back length of barre x4 bilat Leg Press (seat at 8) 165# x15, 170# x 15 Standing rocker board  for DF/PF x2 min Standing rocker board for lateral weight shift x2 min Standing rocker board to maintain balance x1 min Seated rows 60# 2x10 Seated lat pull-down 60# x10, 65# x10 10 th visit reassessment completed  08/27/2023 Nustep level 5 x6 min with PT present to discuss  status Standing rocker board for DF/PF x2 min Standing rocker board for lateral weight shift x2 min Standing rocker board to maintain balance x1 min Seated lat pull-down 60# x10, 65# x10 BOSU squats on flat side of BOSU (mini squats) 2x10 BOSU lunges fwd and lateral x 10 each LE in each direction Seated rows 60# 2x10 Side stepping with yellow loop at barre down and back length of barre x4 bilat Leg Press (seat at 8) 165# x15, 170# x 15 Hip Matrix 45#:  hip abduction and hip extension.  x10 each bilat   08/21/2023 Recumbent bike level 5 x 6 min with PT present to discuss status Standing rocker board for DF/PF x2 min Standing rocker board for lateral weight shift x2 min Standing rocker board to maintain balance x1 min BOSU squats on flat side of BOSU (mini squats) BOSU lunges fwd and lateral x 10 each LE in each direction Lateral band walks with yellow loop 3 laps of 15 feet Leg Press (seat at 8) 160# x15, 165# x 15 Seated rows 60# 2x10 Seated lat pull-down 60# 2x10 Hip Matrix 45#:  hip abduction and hip extension.  2x10 each bilat   PATIENT EDUCATION:  Education details: Issued HEP Person educated: Patient Education method: Explanation, Demonstration, and Handouts Education comprehension: verbalized understanding and returned demonstration  HOME EXERCISE PROGRAM: Access Code: [email or web address] URL: [email or web address] Date: 08/01/2023 Prepared by: Clydie Braun Menke  Exercises - Seated Hamstring Stretch  - 1 x daily - 7 x weekly - 2 reps - 20 sec hold - Seated Piriformis Stretch with Trunk Bend  - 1 x daily - 7 x weekly - 2 reps - 20 sec hold - Supine Bridge  - 1 x daily - 7 x weekly - 2 sets - 10 reps - Supine 90/90 Alternating Heel Touches with Posterior Pelvic Tilt  - 1 x daily - 7 x weekly - 2 sets - 10 reps - Supine Double Knee to Chest  - 1 x daily - 7 x weekly - 2 reps - 20 sec hold - Clamshell with Resistance  - 1 x daily - 7 x weekly - 2 sets - 10 reps - Bird  Dog  - 1 x daily - 7 x weekly - 1-2 sets - 10 reps - Quadruped Fire Hydrant  - 1 x daily - 7 x weekly - 1-2 sets - 10 reps - Quadruped Transversus Abdominis Bracing  - 1 x daily - 7 x weekly - 2 sets - 10 reps - Child's Pose Stretch  - 1 x daily - 7 x weekly - 2 reps - 20 sec hold - Side Stepping with Resistance at Ankles  - 1 x daily - 7 x weekly - 2 sets - 10 reps  ASSESSMENT:  CLINICAL IMPRESSION: Mr Nordmeyer continues to tolerate fairly high level of strengthening and functional training.  His objective findings are much improved. He denies any pain today.  He requires very minimal cuing for technique throughout, particularly with more challenging exercises.  Patient is approaching DC visit.  Will likely be able to continue with an independent program in his local fitness facility.    OBJECTIVE IMPAIRMENTS: decreased balance, difficulty walking, decreased strength, increased muscle spasms, impaired  flexibility, postural dysfunction, and pain.   ACTIVITY LIMITATIONS: lifting, bending, squatting, and stairs  PARTICIPATION LIMITATIONS: community activity and yard work  PERSONAL FACTORS: Past/current experiences, Time since onset of injury/illness/exacerbation, and 3+ comorbidities: lumbar spondylolisthesis, s/p lumbar fusion, CAD  are also affecting patient's functional outcome.   REHAB POTENTIAL: Good  CLINICAL DECISION MAKING: Evolving/moderate complexity  EVALUATION COMPLEXITY: Moderate   GOALS: Goals reviewed with patient? Yes  SHORT TERM GOALS: Target date: 08/02/2023  Patient will be independent with initial HEP. Baseline: Goal status: MET  2.  Pt will report at least a 30% improvement in symptoms since initial evaluation. Baseline:  Goal status: MET 08/29/23  3.  Pt to report ability to go chip some golf balls in a putting green or simulation of that activity without increased pain. Baseline:  Goal status: Met    LONG TERM GOALS: Target date: 09/06/2023  Pt  will be independent with advanced HEP. Baseline:  Goal status: MET 08/29/23  2.  Patient will increase FOTO score to 63 to demonstrate improvement in functional mobility/tasks. Baseline: 52 Goal status: MET 08/29/23  3.  Pt will increase left hip strength to at least 5-/5 to allow him to return to exercising in the gym and easier stair navigation. Baseline:  Goal status: In progress  4.  Patient will increase left single leg stance to greater than 30 sec with min/moderate balance compensations/strategies to decrease risk of falling. Baseline:  Goal status: MET  5.  Patient will report ability to return to playing golf or going to the driving range without increased pain. Baseline:  Goal status: In Progress    PLAN:  PT FREQUENCY: 2x/week  PT DURATION: 8 weeks  PLANNED INTERVENTIONS: Therapeutic exercises, Therapeutic activity, Neuromuscular re-education, Balance training, Gait training, Patient/Family education, Self Care, Joint mobilization, Stair training, Vestibular training, Canalith repositioning, Aquatic Therapy, Dry Needling, Electrical stimulation, Spinal manipulation, Spinal mobilization, Cryotherapy, Moist heat, scar mobilization, Taping, Vasopneumatic device, Traction, Ultrasound, Ionotophoresis 4mg /ml Dexamethasone, Manual therapy, and Re-evaluation.  PLAN FOR NEXT SESSION: Primary focus on hip stability and core strength along with dynamic balance activities, strengthening, flexibility   Mathan Darroch B. Tamyah Cutbirth, PT 08/29/23 12:00 PM [facility] [address] [place], [place] [address] Phone # [phone] Fax [phone]

## 2023-09-02 ENCOUNTER — Encounter: Payer: Self-pay | Admitting: Rehabilitative and Restorative Service Providers"

## 2023-09-02 ENCOUNTER — Ambulatory Visit: Payer: Medicare PPO | Admitting: Rehabilitative and Restorative Service Providers"

## 2023-09-02 DIAGNOSIS — M5459 Other low back pain: Secondary | ICD-10-CM | POA: Diagnosis not present

## 2023-09-02 DIAGNOSIS — R252 Cramp and spasm: Secondary | ICD-10-CM | POA: Diagnosis not present

## 2023-09-02 DIAGNOSIS — M6281 Muscle weakness (generalized): Secondary | ICD-10-CM

## 2023-09-02 DIAGNOSIS — R2689 Other abnormalities of gait and mobility: Secondary | ICD-10-CM | POA: Diagnosis not present

## 2023-09-02 DIAGNOSIS — R262 Difficulty in walking, not elsewhere classified: Secondary | ICD-10-CM | POA: Diagnosis not present

## 2023-09-02 DIAGNOSIS — R293 Abnormal posture: Secondary | ICD-10-CM | POA: Diagnosis not present

## 2023-09-02 NOTE — Therapy (Signed)
OUTPATIENT PHYSICAL THERAPY TREATMENT NOTE    Patient Name: Todd Kane MRN: [id number] DOB:[birth year], 67 y.o., male Today's Date: 09/02/2023  END OF SESSION:  PT End of Session - 09/02/23 1111     Visit Number 11    Date for PT Re-Evaluation 09/06/23    Authorization Type [employer]    Authorization Time Period 07/16/2023 - 09/06/2023    Authorization - Visit Number 11    Authorization - Number of Visits 16    Progress Note Due on Visit 20    PT Start Time 1100    PT Stop Time 1140    PT Time Calculation (min) 40 min    Activity Tolerance Patient tolerated treatment well    Behavior During Therapy [facility] for tasks assessed/performed             Past Medical History:  Diagnosis Date   Back pain    BPH (benign prostatic hyperplasia)    CAD (coronary artery disease)    a. mild-mod by cardiac CT 03/2017.   Chest pain    Hyperlipidemia    Hypertension    OSA (obstructive sleep apnea) 2013   Pre-diabetes    Spondylolisthesis    Past Surgical History:  Procedure Laterality Date   LAMINECTOMY WITH POSTERIOR LATERAL ARTHRODESIS LEVEL 1 N/A 05/06/2023   Procedure: POSTEROLATERAL FUSION LUMBAR THREE-LUMBAR FOUR;  Surgeon: Arman Bogus, MD;  Location: [facility] OR;  Service: Neurosurgery;  Laterality: N/A;   MOUTH SURGERY  1982   3rd molars   TONSILLECTOMY AND ADENOIDECTOMY  1959   Patient Active Problem List   Diagnosis Date Noted   S/P lumbar fusion 05/06/2023   Preoperative cardiovascular examination 04/10/2023   Pre-diabetes 08/03/2020   Coronary artery calcification seen on CAT scan 01/14/2018   Family history of early CAD 01/14/2018   Chest pain 01/29/2017   Chest pain of uncertain etiology 12/23/2014   Dyspnea on exertion 12/23/2014   Sciatica of right side 12/23/2014   Essential hypertension 12/23/2014   Hypercholesterolemia 12/23/2014    PCP: Farris Has, MD  REFERRING PROVIDER: Sherryl Manges, NP  REFERRING DIAG: M43.16  (ICD-10-CM) - Spondylolisthesis, lumbar region  Rationale for Evaluation and Treatment: Rehabilitation  THERAPY DIAG:  Other low back pain  Muscle weakness (generalized)  Difficulty in walking, not elsewhere classified  Cramp and spasm  ONSET DATE: S/p PLIF L4-5 and posterior lateral fusion L3-4 on 05/06/2023 by Dr Yetta Barre  SUBJECTIVE:  SUBJECTIVE STATEMENT: Pt denies current pain.  PERTINENT HISTORY:  S/p PLIF L4-5 and posterior lateral fusion L3-4 on 05/06/2023 by Dr Yetta Barre, Spondlyolisthesis, BPH, CAD  PAIN:  Are you having pain? Yes: NPRS scale: 0/10 Pain location: low back Pain description: sore Aggravating factors: prolonged periods in certain positions or prolonged ambulation Relieving factors: rest  PRECAUTIONS: Back  RED FLAGS: None   WEIGHT BEARING RESTRICTIONS: No  FALLS:  Has patient fallen in last 6 months? Yes. Number of falls 1 fall when he tripped on something approximately a month after surgery.  LIVING ENVIRONMENT: Lives with: lives with their spouse Lives in: House/apartment Stairs:  multilevel home Has following equipment at home: Dan Humphreys - 2 wheeled and Grab bars  OCCUPATION: Retired Cabin crew for 30 years   PLOF: Independent and Leisure: golfing  PATIENT GOALS: To return to the gym and to playing golf.  NEXT MD VISIT: "a couple of months" for follow up with Dr Yetta Barre  OBJECTIVE:   DIAGNOSTIC FINDINGS:  Lumbar Radiograph on 05/06/2023: IMPRESSION: Fluoroscopic assistance was provided for surgical fusion from L3-L5 levels.  PATIENT SURVEYS:  Eval:  FOTO 52 (projected 63 by visit 11)  08/29/23:  FOTO 67 (projected 63 by visit 11) GOAL MET COGNITION: Overall cognitive status: Within functional limits for tasks assessed     SENSATION: Pt reports some numbness and  tingling in his feet, primarily left LE.  States that it is improved from what it is from prior to surgery.  MUSCLE LENGTH: Hamstrings: tightness bilat  POSTURE: forward head  PALPATION: Mild tenderness to palpation along lumbar spine  LUMBAR ROM:   Eval: Limited at least 25% with increased pain.  Limited by previous precautions from back surgery.  LOWER EXTREMITY ROM:     Eval:  WFL  LOWER EXTREMITY MMT:    Eval:  RLE strength is WFL, left hip strength is grossly 4 to 4+/5 throughout   FUNCTIONAL TESTS:  Eval: 5 times sit to stand: 11.74 sec Timed up and go (TUG): 8.99 sec Single Leg Stance:  right- >30 sec with min to mod balance corrections/strategies.  Left- >30 sec with excessive balance strategies/corrections)   08/29/23 5 times sit to stand: 8.74 sec Timed up and go (TUG): 6.35 sec SLS: both scored high first visit (>30 sec bilaterally) GAIT: Distance walked: >500 ft Assistive device utilized: None Level of assistance: Complete Independence Comments: Pt reports that he usually stops after 15-20 minutes before he stops and reports discomfort with ambulation.  TODAY'S TREATMENT:                                                                                                                              DATE:  09/02/2023 Nustep level 5:  Took 7 min 40 seconds to reach 1000 steps Seated blue pball rollout 5x10 sec Side stepping with yellow loop at barre down and back length of barre x4 bilat Standing rocker board for DF/PF x2 min Standing rocker board  for lateral weight shift x2 min Standing rocker board to maintain balance x1 min Seated rows 60# 2x10 Leg Press (seat at 8) 175# x15, 180# x15 BOSU squats on flat side of BOSU (mini squats) 2x10 BOSU lunges fwd and lateral x 10 each LE in each direction   08/29/2023 Recumbent bike level 1 x 11 min Side stepping with yellow loop at barre down and back length of barre x4 bilat Leg Press (seat at 8) 165# x15, 170#  x 15 Standing rocker board for DF/PF x2 min Standing rocker board for lateral weight shift x2 min Standing rocker board to maintain balance x1 min Seated rows 60# 2x10 Seated lat pull-down 60# x10, 65# x10 10 th visit reassessment completed  08/27/2023 Nustep level 5 x6 min with PT present to discuss status Standing rocker board for DF/PF x2 min Standing rocker board for lateral weight shift x2 min Standing rocker board to maintain balance x1 min Seated lat pull-down 60# x10, 65# x10 BOSU squats on flat side of BOSU (mini squats) 2x10 BOSU lunges fwd and lateral x 10 each LE in each direction Seated rows 60# 2x10 Side stepping with yellow loop at barre down and back length of barre x4 bilat Leg Press (seat at 8) 165# x15, 170# x 15 Hip Matrix 45#:  hip abduction and hip extension.  x10 each bilat     PATIENT EDUCATION:  Education details: Issued HEP Person educated: Patient Education method: Explanation, Demonstration, and Handouts Education comprehension: verbalized understanding and returned demonstration  HOME EXERCISE PROGRAM: Access Code: [email or web address] URL: [email or web address] Date: 08/01/2023 Prepared by: Clydie Braun Oasis Goehring  Exercises - Seated Hamstring Stretch  - 1 x daily - 7 x weekly - 2 reps - 20 sec hold - Seated Piriformis Stretch with Trunk Bend  - 1 x daily - 7 x weekly - 2 reps - 20 sec hold - Supine Bridge  - 1 x daily - 7 x weekly - 2 sets - 10 reps - Supine 90/90 Alternating Heel Touches with Posterior Pelvic Tilt  - 1 x daily - 7 x weekly - 2 sets - 10 reps - Supine Double Knee to Chest  - 1 x daily - 7 x weekly - 2 reps - 20 sec hold - Clamshell with Resistance  - 1 x daily - 7 x weekly - 2 sets - 10 reps - Bird Dog  - 1 x daily - 7 x weekly - 1-2 sets - 10 reps - Quadruped Fire Hydrant  - 1 x daily - 7 x weekly - 1-2 sets - 10 reps - Quadruped Transversus Abdominis Bracing  - 1 x daily - 7 x weekly - 2 sets - 10 reps - Child's Pose Stretch  - 1  x daily - 7 x weekly - 2 reps - 20 sec hold - Side Stepping with Resistance at Ankles  - 1 x daily - 7 x weekly - 2 sets - 10 reps  ASSESSMENT:  CLINICAL IMPRESSION: Mr Manieri continues to progress towards increased strengthening and decreased overall pain.  Patient with progressing standing balance and requires less UE support. Patient with one remaining visit with this certification period.  Will perform reassessment next visit to ascertain if pt would benefit from continued PT vs discharging.  OBJECTIVE IMPAIRMENTS: decreased balance, difficulty walking, decreased strength, increased muscle spasms, impaired flexibility, postural dysfunction, and pain.   ACTIVITY LIMITATIONS: lifting, bending, squatting, and stairs  PARTICIPATION LIMITATIONS: community activity and yard work  PERSONAL FACTORS: Past/current experiences, Time since  onset of injury/illness/exacerbation, and 3+ comorbidities: lumbar spondylolisthesis, s/p lumbar fusion, CAD  are also affecting patient's functional outcome.   REHAB POTENTIAL: Good  CLINICAL DECISION MAKING: Evolving/moderate complexity  EVALUATION COMPLEXITY: Moderate   GOALS: Goals reviewed with patient? Yes  SHORT TERM GOALS: Target date: 08/02/2023  Patient will be independent with initial HEP. Baseline: Goal status: MET  2.  Pt will report at least a 30% improvement in symptoms since initial evaluation. Baseline:  Goal status: MET 08/29/23  3.  Pt to report ability to go chip some golf balls in a putting green or simulation of that activity without increased pain. Baseline:  Goal status: Met    LONG TERM GOALS: Target date: 09/06/2023  Pt will be independent with advanced HEP. Baseline:  Goal status: MET 08/29/23  2.  Patient will increase FOTO score to 63 to demonstrate improvement in functional mobility/tasks. Baseline: 52 Goal status: MET 08/29/23  3.  Pt will increase left hip strength to at least 5-/5 to allow him to  return to exercising in the gym and easier stair navigation. Baseline:  Goal status: In progress  4.  Patient will increase left single leg stance to greater than 30 sec with min/moderate balance compensations/strategies to decrease risk of falling. Baseline:  Goal status: MET  5.  Patient will report ability to return to playing golf or going to the driving range without increased pain. Baseline:  Goal status: In Progress    PLAN:  PT FREQUENCY: 2x/week  PT DURATION: 8 weeks  PLANNED INTERVENTIONS: Therapeutic exercises, Therapeutic activity, Neuromuscular re-education, Balance training, Gait training, Patient/Family education, Self Care, Joint mobilization, Stair training, Vestibular training, Canalith repositioning, Aquatic Therapy, Dry Needling, Electrical stimulation, Spinal manipulation, Spinal mobilization, Cryotherapy, Moist heat, scar mobilization, Taping, Vasopneumatic device, Traction, Ultrasound, Ionotophoresis 4mg /ml Dexamethasone, Manual therapy, and Re-evaluation.  PLAN FOR NEXT SESSION: Reassessment.  Practice golf swing.   Reather Laurence, PT, DPT 09/02/23, 12:25 PM  [facility] [address] [place], [place] [address] Phone # [phone] Fax [phone]

## 2023-09-04 ENCOUNTER — Ambulatory Visit: Payer: Medicare PPO | Admitting: Rehabilitative and Restorative Service Providers"

## 2023-09-04 ENCOUNTER — Encounter: Payer: Self-pay | Admitting: Rehabilitative and Restorative Service Providers"

## 2023-09-04 DIAGNOSIS — M5459 Other low back pain: Secondary | ICD-10-CM | POA: Diagnosis not present

## 2023-09-04 DIAGNOSIS — M6281 Muscle weakness (generalized): Secondary | ICD-10-CM | POA: Diagnosis not present

## 2023-09-04 DIAGNOSIS — R262 Difficulty in walking, not elsewhere classified: Secondary | ICD-10-CM | POA: Diagnosis not present

## 2023-09-04 DIAGNOSIS — R252 Cramp and spasm: Secondary | ICD-10-CM | POA: Diagnosis not present

## 2023-09-04 DIAGNOSIS — R2689 Other abnormalities of gait and mobility: Secondary | ICD-10-CM | POA: Diagnosis not present

## 2023-09-04 DIAGNOSIS — R293 Abnormal posture: Secondary | ICD-10-CM | POA: Diagnosis not present

## 2023-09-04 NOTE — Therapy (Signed)
OUTPATIENT PHYSICAL THERAPY TREATMENT NOTE AND DISCHARGE SUMMARY    Patient Name: Todd Kane MRN: [id number] DOB:26-Apr-1956, 67 y.o., male Today's Date: 09/04/2023  END OF SESSION:  PT End of Session - 09/04/23 1104     Visit Number 12 (P)     Date for PT Re-Evaluation 09/06/23 (P)     Authorization Type [employer] (P)     Authorization Time Period 07/16/2023 - 09/06/2023 (P)     Authorization - Visit Number 12 (P)     Authorization - Number of Visits 16 (P)     PT Start Time 1059 (P)     PT Stop Time 1140 (P)     PT Time Calculation (min) 41 min (P)     Activity Tolerance Patient tolerated treatment well (P)     Behavior During Therapy WFL for tasks assessed/performed (P)              Past Medical History:  Diagnosis Date   Back pain    BPH (benign prostatic hyperplasia)    CAD (coronary artery disease)    a. mild-mod by cardiac CT 03/2017.   Chest pain    Hyperlipidemia    Hypertension    OSA (obstructive sleep apnea) 2013   Pre-diabetes    Spondylolisthesis    Past Surgical History:  Procedure Laterality Date   LAMINECTOMY WITH POSTERIOR LATERAL ARTHRODESIS LEVEL 1 N/A 05/06/2023   Procedure: POSTEROLATERAL FUSION LUMBAR THREE-LUMBAR FOUR;  Surgeon: Arman Bogus, MD;  Location: [facility] OR;  Service: Neurosurgery;  Laterality: N/A;   MOUTH SURGERY  1982   3rd molars   TONSILLECTOMY AND ADENOIDECTOMY  1959   Patient Active Problem List   Diagnosis Date Noted   S/P lumbar fusion 05/06/2023   Preoperative cardiovascular examination 04/10/2023   Pre-diabetes 08/03/2020   Coronary artery calcification seen on CAT scan 01/14/2018   Family history of early CAD 01/14/2018   Chest pain 01/29/2017   Chest pain of uncertain etiology 12/23/2014   Dyspnea on exertion 12/23/2014   Sciatica of right side 12/23/2014   Essential hypertension 12/23/2014   Hypercholesterolemia 12/23/2014    PCP: Farris Has, MD  REFERRING PROVIDER: Sherryl Manges, NP  REFERRING DIAG: M43.16 (ICD-10-CM) - Spondylolisthesis, lumbar region  Rationale for Evaluation and Treatment: Rehabilitation  THERAPY DIAG:  Other low back pain  Muscle weakness (generalized)  Difficulty in walking, not elsewhere classified  Cramp and spasm  ONSET DATE: S/p PLIF L4-5 and posterior lateral fusion L3-4 on 05/06/2023 by Dr Yetta Barre  SUBJECTIVE:  SUBJECTIVE STATEMENT: Pt denies current pain.  States that he feels that he is ready for discharge to continue with personal trainer and the gym.  PERTINENT HISTORY:  S/p PLIF L4-5 and posterior lateral fusion L3-4 on 05/06/2023 by Dr Yetta Barre, Spondlyolisthesis, BPH, CAD  PAIN:  Are you having pain? No  PRECAUTIONS: Back  RED FLAGS: None   WEIGHT BEARING RESTRICTIONS: No  FALLS:  Has patient fallen in last 6 months? Yes. Number of falls 1 fall when he tripped on something approximately a month after surgery.  LIVING ENVIRONMENT: Lives with: lives with their spouse Lives in: House/apartment Stairs:  multilevel home Has following equipment at home: Dan Humphreys - 2 wheeled and Grab bars  OCCUPATION: Retired Cabin crew for 30 years   PLOF: Independent and Leisure: golfing  PATIENT GOALS: To return to the gym and to playing golf.  NEXT MD VISIT: "a couple of months" for follow up with Dr Yetta Barre  OBJECTIVE:   DIAGNOSTIC FINDINGS:  Lumbar Radiograph on 05/06/2023: IMPRESSION: Fluoroscopic assistance was provided for surgical fusion from L3-L5 levels.  PATIENT SURVEYS:  Eval:  FOTO 52 (projected 63 by visit 11)  08/29/23:  FOTO 67 (projected 63 by visit 11) GOAL MET  COGNITION: Overall cognitive status: Within functional limits for tasks assessed     SENSATION: Pt reports some numbness and tingling in his feet, primarily left LE.   States that it is improved from what it is from prior to surgery.  MUSCLE LENGTH: Hamstrings: tightness bilat  POSTURE: forward head  PALPATION: Mild tenderness to palpation along lumbar spine  LUMBAR ROM:   Eval: Limited at least 25% with increased pain.  Limited by previous precautions from back surgery.  LOWER EXTREMITY ROM:     Eval:  WFL  LOWER EXTREMITY MMT:    Eval:  RLE strength is WFL, left hip strength is grossly 4 to 4+/5 throughout  09/04/2023:  Bilateral LE strength is WFL throughout   FUNCTIONAL TESTS:  Eval: 5 times sit to stand: 11.74 sec Timed up and go (TUG): 8.99 sec Single Leg Stance:  right- >30 sec with min to mod balance corrections/strategies.  Left- >30 sec with excessive balance strategies/corrections)   08/29/23 5 times sit to stand: 8.74 sec Timed up and go (TUG): 6.35 sec SLS: both scored high first visit (>30 sec bilaterally) GAIT: Distance walked: >500 ft Assistive device utilized: None Level of assistance: Complete Independence Comments: Pt reports that he usually stops after 15-20 minutes before he stops and reports discomfort with ambulation.  TODAY'S TREATMENT:                                                                                                                              DATE:  09/04/2023 Nustep level 5 x6 min with PT present to discuss status Seated blue pball rollout 5x10 sec Able to go outside and practice various golf swings with club.  X10 without increased pain Standing rocker board for DF/PF  x2 min Standing rocker board for lateral weight shift x2 min Standing rocker board to maintain balance x1 min Ambulation over obstacle course with cobble blocks and foam pads in parallel bars x4 laps Tandem gait FWD and backwards over AirEx Beam down and back x4 laps Standing hamstring stretch on Power Plate at stretch feature (30 Hz for 30 sec) x1 bilat Discussion of HEP and continued work with personal  trainer   09/02/2023 Nustep level 5:  Took 7 min 40 seconds to reach 1000 steps Seated blue pball rollout 5x10 sec Side stepping with yellow loop at barre down and back length of barre x4 bilat Standing rocker board for DF/PF x2 min Standing rocker board for lateral weight shift x2 min Standing rocker board to maintain balance x1 min Seated rows 60# 2x10 Leg Press (seat at 8) 175# x15, 180# x15 BOSU squats on flat side of BOSU (mini squats) 2x10 BOSU lunges fwd and lateral x 10 each LE in each direction   08/29/2023 Recumbent bike level 1 x 11 min Side stepping with yellow loop at barre down and back length of barre x4 bilat Leg Press (seat at 8) 165# x15, 170# x 15 Standing rocker board for DF/PF x2 min Standing rocker board for lateral weight shift x2 min Standing rocker board to maintain balance x1 min Seated rows 60# 2x10 Seated lat pull-down 60# x10, 65# x10 10 th visit reassessment completed      PATIENT EDUCATION:  Education details: Issued HEP Person educated: Patient Education method: Explanation, Demonstration, and Handouts Education comprehension: verbalized understanding and returned demonstration  HOME EXERCISE PROGRAM: Access Code: [email or web address] URL: [email or web address] Date: 08/01/2023 Prepared by: Clydie Braun Aashika Carta  Exercises - Seated Hamstring Stretch  - 1 x daily - 7 x weekly - 2 reps - 20 sec hold - Seated Piriformis Stretch with Trunk Bend  - 1 x daily - 7 x weekly - 2 reps - 20 sec hold - Supine Bridge  - 1 x daily - 7 x weekly - 2 sets - 10 reps - Supine 90/90 Alternating Heel Touches with Posterior Pelvic Tilt  - 1 x daily - 7 x weekly - 2 sets - 10 reps - Supine Double Knee to Chest  - 1 x daily - 7 x weekly - 2 reps - 20 sec hold - Clamshell with Resistance  - 1 x daily - 7 x weekly - 2 sets - 10 reps - Bird Dog  - 1 x daily - 7 x weekly - 1-2 sets - 10 reps - Quadruped Fire Hydrant  - 1 x daily - 7 x weekly - 1-2 sets - 10 reps -  Quadruped Transversus Abdominis Bracing  - 1 x daily - 7 x weekly - 2 sets - 10 reps - Child's Pose Stretch  - 1 x daily - 7 x weekly - 2 reps - 20 sec hold - Side Stepping with Resistance at Ankles  - 1 x daily - 7 x weekly - 2 sets - 10 reps  ASSESSMENT:  CLINICAL IMPRESSION: Mr Deamer presents to skilled PT in agreement with discharge from skilled PT.  Patient was able to safely demonstrate a golf swing without increased pain.  Patient has improved with functional LE strength to at least 5-/5 and able to navigate stairs.  Patient has met all goals and is ready for discharge from PT services to continue with personal trainer and HEP at the gym.  OBJECTIVE IMPAIRMENTS: decreased balance, difficulty walking, decreased strength, increased muscle spasms,  impaired flexibility, postural dysfunction, and pain.   ACTIVITY LIMITATIONS: lifting, bending, squatting, and stairs  PARTICIPATION LIMITATIONS: community activity and yard work  PERSONAL FACTORS: Past/current experiences, Time since onset of injury/illness/exacerbation, and 3+ comorbidities: lumbar spondylolisthesis, s/p lumbar fusion, CAD  are also affecting patient's functional outcome.   REHAB POTENTIAL: Good  CLINICAL DECISION MAKING: Evolving/moderate complexity  EVALUATION COMPLEXITY: Moderate   GOALS: Goals reviewed with patient? Yes  SHORT TERM GOALS: Target date: 08/02/2023  Patient will be independent with initial HEP. Baseline: Goal status: MET  2.  Pt will report at least a 30% improvement in symptoms since initial evaluation. Baseline:  Goal status: MET 08/29/23  3.  Pt to report ability to go chip some golf balls in a putting green or simulation of that activity without increased pain. Baseline:  Goal status: Met    LONG TERM GOALS: Target date: 09/06/2023  Pt will be independent with advanced HEP. Baseline:  Goal status: MET 08/29/23  2.  Patient will increase FOTO score to 63 to demonstrate  improvement in functional mobility/tasks. Baseline: 52 Goal status: MET 08/29/23  3.  Pt will increase left hip strength to at least 5-/5 to allow him to return to exercising in the gym and easier stair navigation. Baseline:  Goal status: MET  4.  Patient will increase left single leg stance to greater than 30 sec with min/moderate balance compensations/strategies to decrease risk of falling. Baseline:  Goal status: MET  5.  Patient will report ability to return to playing golf or going to the driving range without increased pain. Baseline:  Goal status: MET    PLAN:  PT FREQUENCY: 2x/week  PT DURATION: 8 weeks  PLANNED INTERVENTIONS: Therapeutic exercises, Therapeutic activity, Neuromuscular re-education, Balance training, Gait training, Patient/Family education, Self Care, Joint mobilization, Stair training, Vestibular training, Canalith repositioning, Aquatic Therapy, Dry Needling, Electrical stimulation, Spinal manipulation, Spinal mobilization, Cryotherapy, Moist heat, scar mobilization, Taping, Vasopneumatic device, Traction, Ultrasound, Ionotophoresis 4mg /ml Dexamethasone, Manual therapy, and Re-evaluation.  PHYSICAL THERAPY DISCHARGE SUMMARY  Patient agrees to discharge. Patient goals were met. Patient is being discharged due to meeting the stated rehab goals.    Reather Laurence, PT, DPT 09/04/23, 11:06 AM  [facility] [address] [place], [place] [address] Phone # [phone] Fax [phone]

## 2023-11-14 DIAGNOSIS — M4316 Spondylolisthesis, lumbar region: Secondary | ICD-10-CM | POA: Diagnosis not present

## 2023-11-14 DIAGNOSIS — Z6831 Body mass index (BMI) 31.0-31.9, adult: Secondary | ICD-10-CM | POA: Diagnosis not present

## 2024-01-08 DIAGNOSIS — Z125 Encounter for screening for malignant neoplasm of prostate: Secondary | ICD-10-CM | POA: Diagnosis not present

## 2024-01-08 DIAGNOSIS — I10 Essential (primary) hypertension: Secondary | ICD-10-CM | POA: Diagnosis not present

## 2024-01-08 DIAGNOSIS — E785 Hyperlipidemia, unspecified: Secondary | ICD-10-CM | POA: Diagnosis not present

## 2024-01-08 DIAGNOSIS — G4733 Obstructive sleep apnea (adult) (pediatric): Secondary | ICD-10-CM | POA: Diagnosis not present

## 2024-01-08 DIAGNOSIS — Z0000 Encounter for general adult medical examination without abnormal findings: Secondary | ICD-10-CM | POA: Diagnosis not present

## 2024-01-08 DIAGNOSIS — R7309 Other abnormal glucose: Secondary | ICD-10-CM | POA: Diagnosis not present

## 2024-05-14 DIAGNOSIS — M4316 Spondylolisthesis, lumbar region: Secondary | ICD-10-CM | POA: Diagnosis not present

## 2024-05-14 DIAGNOSIS — M5416 Radiculopathy, lumbar region: Secondary | ICD-10-CM | POA: Diagnosis not present

## 2024-05-14 DIAGNOSIS — Z6830 Body mass index (BMI) 30.0-30.9, adult: Secondary | ICD-10-CM | POA: Diagnosis not present
